# Patient Record
Sex: Female | Born: 1935 | Race: White
Health system: Southern US, Community
[De-identification: ages and names within clinical notes are randomized; demographics above are authoritative.]

## PROBLEM LIST (undated history)

## (undated) ENCOUNTER — Ambulatory Visit (INDEPENDENT_AMBULATORY_CARE_PROVIDER_SITE_OTHER): Admission: RE | Payer: Self-pay | Admitting: Family Medicine

## (undated) DIAGNOSIS — K5790 Diverticulosis of intestine, part unspecified, without perforation or abscess without bleeding: Secondary | ICD-10-CM

## (undated) DIAGNOSIS — I10 Essential (primary) hypertension: Secondary | ICD-10-CM

## (undated) DIAGNOSIS — J45909 Unspecified asthma, uncomplicated: Secondary | ICD-10-CM

## (undated) DIAGNOSIS — E079 Disorder of thyroid, unspecified: Secondary | ICD-10-CM

## (undated) DIAGNOSIS — K219 Gastro-esophageal reflux disease without esophagitis: Secondary | ICD-10-CM

## (undated) DIAGNOSIS — F552 Abuse of laxatives: Secondary | ICD-10-CM

## (undated) DIAGNOSIS — R194 Change in bowel habit: Secondary | ICD-10-CM

## (undated) DIAGNOSIS — D649 Anemia, unspecified: Secondary | ICD-10-CM

## (undated) DIAGNOSIS — R011 Cardiac murmur, unspecified: Secondary | ICD-10-CM

## (undated) DIAGNOSIS — M109 Gout, unspecified: Secondary | ICD-10-CM

## (undated) DIAGNOSIS — E785 Hyperlipidemia, unspecified: Secondary | ICD-10-CM

## (undated) DIAGNOSIS — M1990 Unspecified osteoarthritis, unspecified site: Secondary | ICD-10-CM

## (undated) DIAGNOSIS — R079 Chest pain, unspecified: Secondary | ICD-10-CM

## (undated) DIAGNOSIS — R42 Dizziness and giddiness: Secondary | ICD-10-CM

## (undated) DIAGNOSIS — T7840XA Allergy, unspecified, initial encounter: Secondary | ICD-10-CM

## (undated) DIAGNOSIS — R739 Hyperglycemia, unspecified: Secondary | ICD-10-CM

## (undated) HISTORY — DX: Gastro-esophageal reflux disease without esophagitis: K21.9

## (undated) HISTORY — PX: THYROID SURGERY: SHX805

## (undated) HISTORY — DX: Unspecified asthma, uncomplicated: J45.909

## (undated) HISTORY — PX: ABDOMINAL HYSTERECTOMY: SHX81

## (undated) HISTORY — DX: Cardiac murmur, unspecified: R01.1

## (undated) HISTORY — DX: Anemia, unspecified: D64.9

## (undated) HISTORY — DX: Dizziness and giddiness: R42

## (undated) HISTORY — DX: Change in bowel habit: R19.4

## (undated) HISTORY — DX: Gout, unspecified: M10.9

## (undated) HISTORY — DX: Essential (primary) hypertension: I10

## (undated) HISTORY — DX: Disorder of thyroid, unspecified: E07.9

## (undated) HISTORY — DX: Chest pain, unspecified: R07.9

## (undated) HISTORY — DX: Hyperglycemia, unspecified: R73.9

## (undated) HISTORY — DX: Abuse of laxatives: F55.2

## (undated) HISTORY — DX: Unspecified osteoarthritis, unspecified site: M19.90

## (undated) HISTORY — DX: Allergy, unspecified, initial encounter: T78.40XA

## (undated) HISTORY — DX: Hyperlipidemia, unspecified: E78.5

---

## 1987-10-26 HISTORY — PX: THYROIDECTOMY, PARTIAL: SHX18

## 1997-10-25 HISTORY — PX: HYSTERECTOMY: SHX81

## 1997-10-25 HISTORY — PX: BLADDER REPAIR: SHX76

## 2000-10-25 HISTORY — PX: CATARACT EXTRACTION, BILATERAL: SHX1313

## 2011-02-01 ENCOUNTER — Encounter (INDEPENDENT_AMBULATORY_CARE_PROVIDER_SITE_OTHER): Payer: Self-pay

## 2011-02-02 ENCOUNTER — Other Ambulatory Visit (INDEPENDENT_AMBULATORY_CARE_PROVIDER_SITE_OTHER): Payer: Self-pay | Admitting: Family Medicine

## 2011-02-02 ENCOUNTER — Ambulatory Visit (INDEPENDENT_AMBULATORY_CARE_PROVIDER_SITE_OTHER): Payer: Medicare Other

## 2011-02-02 DIAGNOSIS — N390 Urinary tract infection, site not specified: Secondary | ICD-10-CM

## 2011-02-02 NOTE — Progress Notes (Deleted)
Pt here for repeat urine culture.  Specimen sent to [facility].

## 2011-02-04 LAB — URINE CULTURE

## 2011-02-04 NOTE — Progress Notes (Signed)
Pt left urine sample. Sent to [facility].

## 2011-02-11 ENCOUNTER — Telehealth (INDEPENDENT_AMBULATORY_CARE_PROVIDER_SITE_OTHER): Payer: Self-pay

## 2011-02-11 NOTE — Telephone Encounter (Signed)
Pt notified of urine culture-Negative- done 02/02/2011.

## 2011-12-14 ENCOUNTER — Other Ambulatory Visit (INDEPENDENT_AMBULATORY_CARE_PROVIDER_SITE_OTHER): Payer: Self-pay | Admitting: Internal Medicine

## 2012-05-29 DIAGNOSIS — R739 Hyperglycemia, unspecified: Secondary | ICD-10-CM | POA: Insufficient documentation

## 2012-07-11 ENCOUNTER — Ambulatory Visit: Payer: Self-pay | Admitting: Family Medicine

## 2013-08-16 ENCOUNTER — Ambulatory Visit: Payer: Self-pay | Admitting: Family Medicine

## 2013-12-03 ENCOUNTER — Ambulatory Visit: Payer: Self-pay

## 2013-12-03 ENCOUNTER — Observation Stay: Payer: Self-pay | Admitting: Internal Medicine

## 2013-12-03 LAB — CK TOTAL AND CKMB (NOT AT ARMC)
CK, TOTAL: 108 U/L
CK, Total: 114 U/L
CK, Total: 105 U/L
CK-MB: 1 ng/mL (ref 0.5–3.6)
CK-MB: 0.9 ng/mL (ref 0.5–3.6)
CK-MB: 0.9 ng/mL (ref 0.5–3.6)

## 2013-12-03 LAB — CBC
HCT: 37.5 % (ref 35.0–47.0)
HGB: 12.5 g/dL (ref 12.0–16.0)
MCH: 30 pg (ref 26.0–34.0)
MCHC: 33.3 g/dL (ref 32.0–36.0)
MCV: 90 fL (ref 80–100)
Platelet: 379 10*3/uL (ref 150–440)
RBC: 4.16 10*6/uL (ref 3.80–5.20)
RDW: 13.4 % (ref 11.5–14.5)
WBC: 10.4 10*3/uL (ref 3.6–11.0)

## 2013-12-03 LAB — URINALYSIS, COMPLETE
Bacteria: NONE SEEN
Bilirubin,UR: NEGATIVE
Blood: NEGATIVE
GLUCOSE, UR: NEGATIVE mg/dL (ref 0–75)
Hyaline Cast: 4
Ketone: NEGATIVE
Leukocyte Esterase: NEGATIVE
Nitrite: NEGATIVE
Ph: 8 (ref 4.5–8.0)
Protein: NEGATIVE
RBC,UR: <1 /HPF (ref 0–5)
Specific Gravity: 1.013 (ref 1.003–1.030)
Squamous Epithelial: <1

## 2013-12-03 LAB — BASIC METABOLIC PANEL
Anion Gap: 6 — ABNORMAL LOW (ref 7–16)
BUN: 20 mg/dL — AB (ref 7–18)
CO2: 27 mmol/L (ref 21–32)
Calcium, Total: 9.7 mg/dL (ref 8.5–10.1)
Chloride: 101 mmol/L (ref 98–107)
Creatinine: 0.89 mg/dL (ref 0.60–1.30)
EGFR (African American): >60
Glucose: 99 mg/dL (ref 65–99)
Osmolality: 271 (ref 275–301)
Potassium: 4.7 mmol/L (ref 3.5–5.1)
Sodium: 134 mmol/L — ABNORMAL LOW (ref 136–145)

## 2013-12-03 LAB — TROPONIN I
Troponin-I: <0.02 ng/mL
Troponin-I: <0.02 ng/mL

## 2013-12-04 LAB — CBC WITH DIFFERENTIAL/PLATELET
BASOS ABS: 0.1 10*3/uL (ref 0.0–0.1)
Basophil %: 1 %
EOS PCT: 4.1 %
Eosinophil #: 0.4 10*3/uL (ref 0.0–0.7)
HCT: 36.3 % (ref 35.0–47.0)
HGB: 12.4 g/dL (ref 12.0–16.0)
LYMPHS ABS: 3.4 10*3/uL (ref 1.0–3.6)
Lymphocyte %: 30.9 %
MCH: 30.6 pg (ref 26.0–34.0)
MCHC: 34.1 g/dL (ref 32.0–36.0)
MCV: 90 fL (ref 80–100)
MONO ABS: 0.9 x10 3/mm (ref 0.2–0.9)
MONOS PCT: 8.6 %
Neutrophil #: 6 10*3/uL (ref 1.4–6.5)
Neutrophil %: 55.4 %
Platelet: 360 10*3/uL (ref 150–440)
RBC: 4.03 10*6/uL (ref 3.80–5.20)
RDW: 13.7 % (ref 11.5–14.5)
WBC: 10.9 10*3/uL (ref 3.6–11.0)

## 2013-12-04 LAB — LIPID PANEL
Cholesterol: 175 mg/dL (ref 0–200)
HDL: 29 mg/dL — AB (ref 40–60)
Ldl Cholesterol, Calc: 92 mg/dL (ref 0–100)
TRIGLYCERIDES: 272 mg/dL — AB (ref 0–200)
VLDL Cholesterol, Calc: 54 mg/dL — ABNORMAL HIGH (ref 5–40)

## 2013-12-04 LAB — BASIC METABOLIC PANEL
Anion Gap: 3 — ABNORMAL LOW (ref 7–16)
BUN: 24 mg/dL — ABNORMAL HIGH (ref 7–18)
CALCIUM: 9.8 mg/dL (ref 8.5–10.1)
CHLORIDE: 101 mmol/L (ref 98–107)
CREATININE: 1.37 mg/dL — AB (ref 0.60–1.30)
Co2: 31 mmol/L (ref 21–32)
EGFR (African American): 43 — ABNORMAL LOW
EGFR (Non-African Amer.): 37 — ABNORMAL LOW
GLUCOSE: 101 mg/dL — AB (ref 65–99)
Osmolality: 274 (ref 275–301)
Potassium: 4 mmol/L (ref 3.5–5.1)
SODIUM: 135 mmol/L — AB (ref 136–145)

## 2014-01-16 DIAGNOSIS — M109 Gout, unspecified: Secondary | ICD-10-CM | POA: Insufficient documentation

## 2014-08-17 ENCOUNTER — Ambulatory Visit: Payer: Self-pay | Admitting: Physician Assistant

## 2014-09-02 DIAGNOSIS — R194 Change in bowel habit: Secondary | ICD-10-CM | POA: Insufficient documentation

## 2014-09-30 ENCOUNTER — Ambulatory Visit: Payer: Self-pay | Admitting: Gastroenterology

## 2014-10-10 ENCOUNTER — Ambulatory Visit: Payer: Self-pay | Admitting: Gastroenterology

## 2014-12-24 DIAGNOSIS — M1A09X0 Idiopathic chronic gout, multiple sites, without tophus (tophi): Secondary | ICD-10-CM | POA: Insufficient documentation

## 2014-12-24 DIAGNOSIS — I10 Essential (primary) hypertension: Secondary | ICD-10-CM | POA: Insufficient documentation

## 2015-02-15 NOTE — Discharge Summary (Signed)
PATIENT NAME:  Michele Hubbard, Zephyr S MR#:  [id number] DATE OF BIRTH:  September 07, 1935  DATE OF ADMISSION:  12/03/2013 DATE OF DISCHARGE:  12/04/2013  ADMISSION DIAGNOSIS:  Chest pain.   DISCHARGE DIAGNOSES: 1.  Chest pain not related to cardiac issues.  2.  History of hypertension. 3.  History of hypothyroidism.  4.  Arthritis.   CONSULTATIONS: Cardiology.   The patient underwent a nuclear medicine stress test which is negative for any evidence of ischemia.  Discharge white blood cells 10.9, hemoglobin 12.4, hematocrit 36.3, platelets are 360. Sodium 135, potassium 4.0, chloride 101, bicarb 31, BUN 24, creatinine 1.37, glucose is 101. LDL is 92, cholesterol 175.  HOSPITAL COURSE: A 80 year old female who presented with chest pain,  and hypertension. For further details, please refer to the H and P.   1.  Chest pain.  This did not seem cardiac in nature. The patient was admitted to telemetry. Her cardiac enzymes x 3 were negative. She did undergo a Myoview stress test, which was negative. She had no evidence of chest pain during the hospitalization. Cardiology was consulted. This is not felt to be cardiac in nature.  2.  Malignant hypertension. Blood pressure was high in the Emergency Room; this is treated.  In the inpatient setting at the hospital, her blood pressures were tolerable. She will be discharged back with her outpatient medications.  3.  Hyperlipidemia. The patient will continue on outpatient medications.  4.  Hypothyroidism.  The patient will continue on Synthroid.   DISCHARGE MEDICATIONS: 1.  Colchicine 0.6 mg as needed.  2.  Vitamin D3 one tablet daily.  3.  HCTZ/losartan 25/100 daily.  4.  Meclizine 25 mg t.i.d. p.r.n.  5.  Montelukast 10 mg at bedtime.  6.  Multivitamin 1 tablet daily.  7.  Fish oil 1000 mg b.i.d. 8.  Ranitidine 150 mg b.i.d.  9.  Simvastatin 20 mg at bedtime.  10.  Triamcinolone 2 sprays daily as needed.  11.  Synthroid 75 mcg daily.  12.  ProAir 2 puffs  q. 6 hours p.r.n.  13.  Aspirin 81 mg daily.  14.  Atenolol 50 mg daily.  15.  Calcium plus vitamin D 1 tablet b.i.d.  16.  Clobetasol apply to affected area b.i.d.   DISCHARGE DIET: Low sodium.   DISCHARGE ACTIVITY: As tolerated.  DISCHARGE FOLLOWUP:  The patient will follow up in 2 weeks with Dr. Lady GaryFath, in 1 week with Dr. Dayna BarkerAldridge. Her creatinine was slightly elevated so since she is on losartan/HCTZ, we recommend Dr. Dayna BarkerAldridge to repeat a BMP next week when she sees the patient.  TIME SPENT:  35 minutes.  The patient is medically stable for discharge.   ____________________________ Tali Coster P. Juliene PinaMody, MD spm:ce D: 12/04/2013 14:50:42 ET T: 12/04/2013 17:50:45 ET JOB#: [id number]  cc: Dorwin Fitzhenry P. Juliene PinaMody, MD, <Dictator> Janyth ContesSITAL P Mindie Rawdon MD ELECTRONICALLY SIGNED 12/05/2013 14:35

## 2015-02-15 NOTE — H&P (Signed)
PATIENT NAME:  Michele Hubbard, Michele Hubbard MR#:  [id number] DATE OF BIRTH:  [birth year]  DATE OF ADMISSION:  12/03/2013  PRIMARY CARE PHYSICIAN: Leim FabryBarbara Aldridge, MD  HISTORY OF PRESENT ILLNESS: The patient is a 80 year old Caucasian female with past medical history significant for history of hypertension, hyperlipidemia, gastroesophageal reflux disease, also hiatal hernia who presents to the hospital with complaints of chest pain. According to the patient, she was doing well up until approximately yesterday at 2:00 p.m. when she started having significant chest pain while she was standing [facility] [employer] ordering a meal. Pain was described as gripping, muscle spasm pain, 8 out of 10 by intensity, intermittent pain with some improvements. It would last approximately 5 minutes and then it would go away for 10 or 15 minutes and it would come back. Pain was radiating into the right upper chest posteriorly. There was no change with deep breathing or walking or change with meals. The patient felt that it could have been meal related or maybe acting of her hiatal hernia so she decided to take numerous Tums after which her pain somewhat abated but not significantly so. Today she still has some intermittent pains in the anterior chest, and she decided to go to urgent care for further evaluation. In urgent care, her EKG was found to be slightly abnormal with ST depressions in anterolateral leads as well as inferior leads and she was sent to the Emergency Room for further evaluation. In the Emergency Room, her labs were unremarkable, however, hospitalist services were contacted for admission since the patient'Hubbard pain is still there. Her blood pressure is also markedly elevated close to 190s over 90s here in the Emergency Room.   PAST MEDICAL HISTORY: Significant for hypertension, gastroesophageal disease, hyperlipidemia, hypothyroidism, hiatal hernia, gout, arthritis, allergies, wheezing in the lungs, and lichen sclerosis.   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 Also bladder repair, skin mole removal, hysterectomy, colonoscopy, and polyps in the colon.   ALLERGIES: SULFA AS WELL AS DARVOCET.  FAMILY HISTORY: Hypertension in the patient'Hubbard father who had a massive heart attack at age of 78. The patient'Hubbard mother had COPD as well as osteoporosis and thyroid problems.   SOCIAL HISTORY: The patient is widowed since [id number]. She lives with her sister. She has 2 children. One son lives in [place], [place] and the other one [place]. No smoking, current or prior, or alcohol abuse.   REVIEW OF SYSTEMS: Positive for feeling cold, pains in the chest, bilateral cataract removal, bifocal glasses, some sinus congestion, intermittent headaches, postnasal drip, some wheezing as well as dyspnea on exertion, intermittent chest pains since yesterday, also diarrheal stool earlier today in the morning.  CONSTITUTIONAL: Otherwise, denies any high fevers, fatigue or weakness, weight loss or gain. EYES: Denies any blurry vision, double vision, or glaucoma.  ENT: Denies any tinnitus, allergies, epistaxis, sinus pain, dentures, or difficulty swallowing.  RESPIRATORY: Denies any cough, hemoptysis, asthma, or COPD. CARDIOVASCULAR: Denies any orthopnea, edema, arrhythmias, palpitations, or syncope.  GASTROINTESTINAL: Denies nausea, vomiting, rectal bleeding, or change in bowel habits.  GENITOURINARY: Denies dysuria, hematuria, frequency, or incontinence.  ENDOCRINOLOGY: Denies any polydipsia, nocturia, thyroid problems, heat or cold intolerance or thirst.  HEMATOLOGIC: Denies anemia, easy bruising, bleeding swollen glands.  SKIN: Denies any acne, rashes, lesions, or change in moles.  MUSCULOSKELETAL: Denies arthritis, cramps, swelling. NEUROLOGIC: Denies epilepsy or tremors.  PSYCHIATRIC: Denies any anxiety, insomnia, or depression.  PHYSICAL EXAMINATION: VITAL SIGNS: On arrival to the  hospital,  the patient'Hubbard temperature was 98.1, pulse was 102, respiratory rate was 18, blood pressure 187/89, and saturation 94% on room air.  GENERAL: This is a well-developed, well-nourished Caucasian female in no significant distress, sitting on the stretcher. She is pale.  HEENT: Her pupils are equal and reactive to light. Extraocular movements are intact. No icterus or conjunctivitis. Has normal hearing. No pharyngeal erythema. Mucosa is dry.  NECK: No masses. Supple and nontender. Thyroid is not enlarged. No adenopathy. No JVD or carotids bilaterally. Full range of motion.  LUNGS: Clear to auscultation in all fields. No rales, rhonchi, diminished breath sounds, or wheezing. No labored inspiration, decreased effort, dullness to percussion, or overt respiratory distress.  CARDIOVASCULAR: S1 and S2 appreciated. No murmurs, gallops, or rubs noted. PMI not lateralized. Chest is nontender to palpation.  EXTREMITIES: 1+ pedal pulses.  EXTREMITIES: No lower extremity edema, calf tenderness or cyanosis. Mild discomfort on palpation over her lower sternum however is noted, but not the same pain that the patient came in with.  ABDOMEN: Soft and nontender. Bowel sounds are present. No hepatosplenomegaly or masses were noted.  RECTAL: Deferred.  MUSCULOSKELETAL Muscle strength: Able to move all extremities. No cyanosis or degenerative joint disease. Mild kyphosis. Gait not tested. The patient'Hubbard spine is nontender to palpation.  SKIN: Did not reveal any rashes, lesions, erythema, nodularity, or induration. It was warm and dry to palpation.  LYMPHATIC: No adenopathy in the cervical region.  NEUROLOGIC: Cranial nerves grossly intact. Sensory is intact. No dysarthria or aphasia. The patient is alert and oriented to time, person and place, cooperative. Memory is good.  PSYCHIATRIC: No significant confusion, agitation, or depression noted.   LABORATORY AND DIAGNOSTICS: The patient'Hubbard lab data done today, on  12/03/2013, revealed mild elevation of BUN to 20 and sodium 134, otherwise BMP was unremarkable. Troponin level less than 0.02. CBC within normal limits.   The patient'Hubbard EKG initially in urgent care revealed normal sinus rhythm at 92 beats per minute, normal axis, minimal voltage criteria for LVH. Also anterior infarct with poor RV progression in V3. ST depressions in anterolateral leads, also anterior leads. A repeated EKG here in the Emergency Room showed normal sinus rhythm at 98 beats per minute, normal axis, ST-T depressions. Consider inferior lateral ischemia, according to EKG criteria.   Chest x-ray, portable single view, 12/03/2013, showed no active disease.   ASSESSMENT AND PLAN: 1.  Chest pain. Admit her to the medical floor. Start her on metoprolol, nitroglycerin, and aspirin as well as heparin subcutaneously. Will get Myoview stress test in the morning. We will get also cardiology consultation. 2.  Malignant hypertension, possibly the reason for the patient'Hubbard chest pains as well as headaches and dizziness. Will again initiate metoprolol as well as nitroglycerin while she is in the hospital. Will continue all other home medications. The patient'Hubbard blood pressure is very high and needs to be treated more aggressively. 3.  Hyperlipidemia. We will continue home medications. Get lipid panel in the morning. 4.  Gastroesophageal reflux disease. We will continue proton pump inhibitors.   TIME SPENT: 50 minutes. ____________________________ Katharina Caper, MD rv:sb D: 12/03/2013 13:57:09 ET T: 12/03/2013 14:24:35 ET JOB#: [id number]  cc: Katharina Caper, MD, <Dictator> Katina Dung. Dayna Barker, MD Katharina Caper MD ELECTRONICALLY SIGNED 12/26/2013 20:29

## 2015-02-15 NOTE — H&P (Signed)
PATIENT NAME:  Reginold AgentCHILDRESS, Michele S MR#:  [id number] DATE OF BIRTH:  1935-05-28  DATE OF ADMISSION:  12/03/2013  ADDENDUM MEDICATION LIST IS AS FOLLOWS:  1. Aspirin 81 mg p.o. daily.  2. Atenolol 50 mg p.o. daily.  3. Calcium with vitamin D 600/200, 1 tablet twice daily.  4. Clobestasol topical lotion 0.05% apply to affected area twice daily as needed for lichen disease.  5. Colchicine 0.6 mg 2 tablets once at the first sign of a gout flare, and then  1 tablet every one hour, then 1 tablet p.o. daily for the next three days.  6. Fish oil 1 gram twice daily.  7. Hydrochlorothiazide losartan 25/100, 1 tablet once daily.  8. Levothyroxine 75 mcg p.o. daily.  9. Meclizine 25 mg p.o. 3 times daily as needed.  10. Montelukast 10 mg p.o. daily at bedtime.  11. Multivitamins once daily.  12. ProAir HFA 2 puffs every six hours as needed.  13. Ranitidine 150 mg twice daily.  14. Simvastatin 10 mg p.o. at bedtime.  15. Triamcinolone nasal spray, 2 sprays to the nose once daily.  16. Vitamin D3 1 tablet once daily.     ____________________________ Katharina Caperima Jafar Poffenberger, MD rv:sg D: 12/03/2013 14:00:00 ET T: 12/03/2013 14:20:13 ET JOB#: [id number]  cc: Katharina Caperima Kynesha Guerin, MD, <Dictator> Keyli Duross MD ELECTRONICALLY SIGNED 12/26/2013 20:32

## 2015-02-15 NOTE — Consult Note (Signed)
   Present Illness 80 yo female with history of copd, hypertension, hyperlipidemia and atypical chest pain who has undergone a functional study in the past which revealed no ischemia. She is now admitted agter several days of episodic "cramping" chest pain. Not worsened with exertion or food intake. She presented to the er where her ekg was unremarkable. She has ruled out for an mi and has had no further chest pain since admission. She is treated with atenolol, losartan, asa and simvastatin as outpatient.   Physical Exam:  GEN no acute distress, obese   HEENT PERRL, hearing intact to voice   NECK supple  No masses   RESP normal resp effort  no use of accessory muscles   CARD Regular rate and rhythm  No murmur   ABD denies tenderness  normal BS   LYMPH negative neck, negative axillae   EXTR negative cyanosis/clubbing, negative edema   SKIN normal to palpation, skin turgor decreased   NEURO cranial nerves intact, motor/sensory function intact   PSYCH A+O to time, place, person   Review of Systems:  Subjective/Chief Complaint chest pain   General: No Complaints   Skin: No Complaints   ENT: No Complaints   Eyes: No Complaints   Neck: No Complaints   Respiratory: No Complaints   Cardiovascular: Chest pain or discomfort   Gastrointestinal: No Complaints   Genitourinary: No Complaints   Vascular: No Complaints   Musculoskeletal: No Complaints   Neurologic: No Complaints   Hematologic: No Complaints   Endocrine: No Complaints   Psychiatric: No Complaints   Review of Systems: All other systems were reviewed and found to be negative   Medications/Allergies Reviewed Medications/Allergies reviewed   EKG:  EKG NSR   Abnormal NSSTTW changes   Interpretation no ischemia    Sulfa drugs: Hives   Impression 80 yo female with history of hypertension and hyperlipidemia who was admitted with episodic chest discomfort. She has ruled out for an mi and ekg and cxr  are both unremarkable. Pain has both typical and atypical feauters.   Plan 1. Continue with atenolol , simvastatin, losartan 2. Proceed with funcitonal study in am 3. Will make further recs after funcitonal study is copleted.   Electronic Signatures: Dalia HeadingFath, Lakyia Behe A (MD)  (Signed 09-Feb-15 20:37)  Authored: General Aspect/Present Illness, History and Physical Exam, Review of System, EKG , Allergies, Impression/Plan   Last Updated: 09-Feb-15 20:37 by Dalia HeadingFath, Venera Privott A (MD)

## 2015-04-21 ENCOUNTER — Ambulatory Visit
Admission: EM | Admit: 2015-04-21 | Discharge: 2015-04-21 | Disposition: A | Discharge status: Home / Self Care | Payer: Medicare Other | Attending: Internal Medicine | Admitting: Internal Medicine

## 2015-04-21 ENCOUNTER — Encounter: Payer: Self-pay | Admitting: Emergency Medicine

## 2015-04-21 DIAGNOSIS — I10 Essential (primary) hypertension: Secondary | ICD-10-CM | POA: Diagnosis not present

## 2015-04-21 HISTORY — DX: Essential (primary) hypertension: I10

## 2015-04-21 NOTE — Discharge Instructions (Signed)

## 2015-04-21 NOTE — ED Notes (Signed)
Patient states her BP has been elevated off and on for several days  And she has a headache and neck ache

## 2015-04-21 NOTE — ED Provider Notes (Signed)
CSN: [id number]     Arrival date & time 04/21/15  1104 History   First MD Initiated Contact with Patient 04/21/15 1202     Chief Complaint  Patient presents with  . Hypertension   (Consider location/radiation/quality/duration/timing/severity/associated sxs/prior Treatment) HPI  This a 80 year old female who presents with elevated blood pressure and left-sided headache which is not unusual for her. She states that she tried to contact Dr. Leim Fabry today was told that they were booked and she spoke with the nurse who directed her here. Her pressures at home have been 179/84 and her last note from Dr. Dayna Barker of 12/24/2014 is 132/64. She also relates that she has missed 2 days of her Synthroid and noticed that she was feeling weak and fatigued. She started taking her Synthroid again yesterday and today. Recheck of her blood pressure manually by the nurse was recorded at 160/82. She denies any syncope dizziness weakness slurred speech or decreased concentration. She is under some stress taking care of her sister who had a stroke and requires assistance with medications transportation and some activities of daily living.  Past Medical History  Diagnosis Date  . Hypertension    Past Surgical History  Procedure Laterality Date  . Abdominal hysterectomy    . Thyroid surgery     Family History  Problem Relation Age of Onset  . Heart failure Father    History  Substance Use Topics  . Smoking status: Never Smoker   . Smokeless tobacco: Never Used  . Alcohol Use: No   OB History    No data available     Review of Systems  Neurological: Positive for headaches.  All other systems reviewed and are negative.   Allergies  Penicillins  Home Medications   Prior to Admission medications   Medication Sig Start Date End Date Taking? Authorizing Provider  albuterol (PROVENTIL HFA;VENTOLIN HFA) 108 (90 BASE) MCG/ACT inhaler Inhale into the lungs every 6 (six) hours as needed for  wheezing or shortness of breath.   Yes Historical Provider, MD  allopurinol (ZYLOPRIM) 100 MG tablet Take 200 mg by mouth daily.   Yes Historical Provider, MD  aspirin EC 81 MG tablet Take 81 mg by mouth daily.   Yes Historical Provider, MD  atenolol (TENORMIN) 100 MG tablet Take 100 mg by mouth daily.   Yes Historical Provider, MD  atorvastatin (LIPITOR) 20 MG tablet Take 20 mg by mouth daily.   Yes Historical Provider, MD  clobetasol cream (TEMOVATE) 0.05 % Apply 1 application topically 2 (two) times daily.   Yes Historical Provider, MD  colchicine 0.6 MG tablet Take 0.6 mg by mouth daily.   Yes Historical Provider, MD  esomeprazole (NEXIUM) 20 MG capsule Take 20 mg by mouth daily at 12 noon.   Yes Historical Provider, MD  levothyroxine (SYNTHROID, LEVOTHROID) 75 MCG tablet Take 75 mcg by mouth daily before breakfast.   Yes Historical Provider, MD  losartan-hydrochlorothiazide (HYZAAR) 100-25 MG per tablet Take 1 tablet by mouth daily.   Yes Historical Provider, MD  meclizine (ANTIVERT) 25 MG tablet Take 25 mg by mouth 3 (three) times daily as needed for dizziness.   Yes Historical Provider, MD   BP 168/82 mmHg  Pulse 57  Temp(Src) 98.1 F (36.7 C) (Tympanic)  Resp 16  Ht 5\' 2"  (1.575 m)  Wt 134 lb (60.782 kg)  BMI 24.50 kg/m2  SpO2 100% Physical Exam  Constitutional: She is oriented to person, place, and time. She appears well-developed and well-nourished.  HENT:  Head: Normocephalic and atraumatic.  Right Ear: External ear normal.  Left Ear: External ear normal.  Mouth/Throat: Oropharynx is clear and moist.  Eyes: EOM are normal. Pupils are equal, round, and reactive to light. Right eye exhibits no discharge. Left eye exhibits no discharge.  Neck: Normal range of motion. Neck supple. No thyromegaly present.  Pulmonary/Chest: Breath sounds normal. No respiratory distress. She has no wheezes. She exhibits no tenderness.  Musculoskeletal: Normal range of motion.  Lymphadenopathy:     She has no cervical adenopathy.  Neurological: She is alert and oriented to person, place, and time. She has normal reflexes. She displays normal reflexes. No cranial nerve deficit. She exhibits normal muscle tone. Coordination normal.  Skin: Skin is warm and dry.  Psychiatric: She has a normal mood and affect. Her behavior is normal. Judgment and thought content normal.  Nursing note and vitals reviewed.   ED Course  Procedures (including critical care time) Labs Review Labs Reviewed - No data to display  Imaging Review No results found.   MDM   1. Hypertension not at goal    I spoke with the patient today after examination found no focal defects from her elevated pressure. She felt badly after missing her doses of Synthroid and incidentally took her blood pressure finding it to be slightly elevated. At this point time a recheck with manual pressure should be 168/82. I have reassured her that she is not in a crisis or urgency at the present time. However asked her to continue checking her blood pressure and she's feeling poorly and her pressures are elevating should go to the emergency department. I've encouraged her to establish an appointment with Dr. Theodis Aguas for reevaluation and possible tweaking of her medications as necessary. I would not make any changes to her medication regimen at this time.    Lutricia Feil, PA-C 04/21/15 1243

## 2015-07-07 ENCOUNTER — Other Ambulatory Visit: Payer: Self-pay | Admitting: Family Medicine

## 2015-07-07 DIAGNOSIS — Z780 Asymptomatic menopausal state: Secondary | ICD-10-CM

## 2016-01-07 DIAGNOSIS — D649 Anemia, unspecified: Secondary | ICD-10-CM | POA: Insufficient documentation

## 2016-07-15 ENCOUNTER — Encounter: Payer: Self-pay | Admitting: Emergency Medicine

## 2016-07-15 ENCOUNTER — Emergency Department: Payer: Medicare Other

## 2016-07-15 ENCOUNTER — Observation Stay
Admission: EM | Admit: 2016-07-15 | Discharge: 2016-07-16 | Disposition: A | Discharge status: Home / Self Care | Payer: Medicare Other | Attending: Internal Medicine | Admitting: Internal Medicine

## 2016-07-15 DIAGNOSIS — Z7982 Long term (current) use of aspirin: Secondary | ICD-10-CM | POA: Insufficient documentation

## 2016-07-15 DIAGNOSIS — R079 Chest pain, unspecified: Principal | ICD-10-CM | POA: Diagnosis present

## 2016-07-15 DIAGNOSIS — E785 Hyperlipidemia, unspecified: Secondary | ICD-10-CM | POA: Insufficient documentation

## 2016-07-15 DIAGNOSIS — Z880 Allergy status to penicillin: Secondary | ICD-10-CM | POA: Insufficient documentation

## 2016-07-15 DIAGNOSIS — Z7952 Long term (current) use of systemic steroids: Secondary | ICD-10-CM | POA: Diagnosis not present

## 2016-07-15 DIAGNOSIS — I10 Essential (primary) hypertension: Secondary | ICD-10-CM | POA: Insufficient documentation

## 2016-07-15 DIAGNOSIS — Z79899 Other long term (current) drug therapy: Secondary | ICD-10-CM | POA: Diagnosis not present

## 2016-07-15 DIAGNOSIS — Z90710 Acquired absence of both cervix and uterus: Secondary | ICD-10-CM | POA: Diagnosis not present

## 2016-07-15 DIAGNOSIS — E890 Postprocedural hypothyroidism: Secondary | ICD-10-CM | POA: Diagnosis not present

## 2016-07-15 HISTORY — DX: Diverticulosis of intestine, part unspecified, without perforation or abscess without bleeding: K57.90

## 2016-07-15 HISTORY — DX: Disorder of thyroid, unspecified: E07.9

## 2016-07-15 LAB — CBC
HCT: 35.1 % (ref 35.0–47.0)
HCT: 31.7 % — ABNORMAL LOW (ref 35.0–47.0)
HEMOGLOBIN: 12 g/dL (ref 12.0–16.0)
HEMOGLOBIN: 11.3 g/dL — AB (ref 12.0–16.0)
MCH: 30.3 pg (ref 26.0–34.0)
MCH: 31.3 pg (ref 26.0–34.0)
MCHC: 34.1 g/dL (ref 32.0–36.0)
MCHC: 35.6 g/dL (ref 32.0–36.0)
MCV: 88.8 fL (ref 80.0–100.0)
MCV: 88.1 fL (ref 80.0–100.0)
PLATELETS: 339 10*3/uL (ref 150–440)
Platelets: 311 10*3/uL (ref 150–440)
RBC: 3.95 MIL/uL (ref 3.80–5.20)
RBC: 3.59 MIL/uL — AB (ref 3.80–5.20)
RDW: 13.9 % (ref 11.5–14.5)
RDW: 13.6 % (ref 11.5–14.5)
WBC: 12.7 10*3/uL — AB (ref 3.6–11.0)
WBC: 8.5 10*3/uL (ref 3.6–11.0)

## 2016-07-15 LAB — COMPREHENSIVE METABOLIC PANEL
ALK PHOS: 58 U/L (ref 38–126)
ALT: 15 U/L (ref 14–54)
AST: 23 U/L (ref 15–41)
Albumin: 4.7 g/dL (ref 3.5–5.0)
Anion gap: 9 (ref 5–15)
BUN: 22 mg/dL — AB (ref 6–20)
CO2: 25 mmol/L (ref 22–32)
CREATININE: 1.03 mg/dL — AB (ref 0.44–1.00)
Calcium: 9.9 mg/dL (ref 8.9–10.3)
Chloride: 99 mmol/L — ABNORMAL LOW (ref 101–111)
GFR calc Af Amer: 58 mL/min — ABNORMAL LOW (ref >60)
GFR calc non Af Amer: 50 mL/min — ABNORMAL LOW (ref >60)
Glucose, Bld: 106 mg/dL — ABNORMAL HIGH (ref 65–99)
Potassium: 3.8 mmol/L (ref 3.5–5.1)
SODIUM: 133 mmol/L — AB (ref 135–145)
Total Bilirubin: 0.8 mg/dL (ref 0.3–1.2)
Total Protein: 8.2 g/dL — ABNORMAL HIGH (ref 6.5–8.1)

## 2016-07-15 LAB — CREATININE, SERUM
CREATININE: 0.94 mg/dL (ref 0.44–1.00)
GFR calc non Af Amer: 56 mL/min — ABNORMAL LOW (ref >60)

## 2016-07-15 LAB — SEDIMENTATION RATE: Sed Rate: 25 mm/hr (ref 0–30)

## 2016-07-15 LAB — BRAIN NATRIURETIC PEPTIDE: B Natriuretic Peptide: 239 pg/mL — ABNORMAL HIGH (ref 0.0–100.0)

## 2016-07-15 LAB — TROPONIN I
Troponin I: <0.03 ng/mL (ref <0.03)
Troponin I: <0.03 ng/mL (ref <0.03)

## 2016-07-15 MED ORDER — MECLIZINE HCL 25 MG PO TABS
12.5000 mg | ORAL_TABLET | Freq: Once | ORAL | Status: AC
  Administered 2016-07-15: 12.5 mg via ORAL
  Filled 2016-07-15: qty 0.5

## 2016-07-15 MED ORDER — HYDRALAZINE HCL 20 MG/ML IJ SOLN
10.0000 mg | Freq: Four times a day (QID) | INTRAMUSCULAR | Status: DC | PRN
Start: 2016-07-15 — End: 2016-07-16

## 2016-07-15 MED ORDER — GI COCKTAIL ~~LOC~~
30.0000 mL | Freq: Once | ORAL | Status: AC
  Administered 2016-07-15: 30 mL via ORAL
  Filled 2016-07-15: qty 30

## 2016-07-15 MED ORDER — ATENOLOL 25 MG PO TABS
50.0000 mg | ORAL_TABLET | Freq: Every day | ORAL | Status: DC
  Administered 2016-07-16: 50 mg via ORAL
  Filled 2016-07-15: qty 2

## 2016-07-15 MED ORDER — ATORVASTATIN CALCIUM 20 MG PO TABS
20.0000 mg | ORAL_TABLET | Freq: Every day | ORAL | Status: DC
  Administered 2016-07-16: 20 mg via ORAL
  Filled 2016-07-15: qty 1

## 2016-07-15 MED ORDER — HYDROCODONE-ACETAMINOPHEN 5-325 MG PO TABS: 1.0000 | ORAL_TABLET | ORAL | Status: DC | PRN

## 2016-07-15 MED ORDER — HYDROCHLOROTHIAZIDE 25 MG PO TABS
25.0000 mg | ORAL_TABLET | Freq: Every day | ORAL | Status: DC
  Administered 2016-07-16: 25 mg via ORAL
  Filled 2016-07-15: qty 1

## 2016-07-15 MED ORDER — ASPIRIN 81 MG PO CHEW
81.0000 mg | CHEWABLE_TABLET | Freq: Every day | ORAL | Status: DC
  Administered 2016-07-16: 81 mg via ORAL
  Filled 2016-07-15: qty 1

## 2016-07-15 MED ORDER — ALUM & MAG HYDROXIDE-SIMETH 200-200-20 MG/5ML PO SUSP
15.0000 mL | Freq: Four times a day (QID) | ORAL | Status: DC | PRN
Start: 2016-07-15 — End: 2016-07-16
  Administered 2016-07-15: 15 mL via ORAL
  Filled 2016-07-15: qty 30

## 2016-07-15 MED ORDER — ALLOPURINOL 100 MG PO TABS
100.0000 mg | ORAL_TABLET | Freq: Every day | ORAL | Status: DC
  Administered 2016-07-16: 100 mg via ORAL
  Filled 2016-07-15: qty 1

## 2016-07-15 MED ORDER — ACETAMINOPHEN 650 MG RE SUPP: 650.0000 mg | Freq: Four times a day (QID) | RECTAL | Status: DC | PRN

## 2016-07-15 MED ORDER — AMLODIPINE BESYLATE 5 MG PO TABS
5.0000 mg | ORAL_TABLET | Freq: Every day | ORAL | Status: DC
  Administered 2016-07-15 – 2016-07-16 (×2): 5 mg via ORAL
  Filled 2016-07-15 (×2): qty 1

## 2016-07-15 MED ORDER — KRILL OIL 1000 MG PO CAPS: ORAL_CAPSULE | Freq: Every day | ORAL | Status: DC

## 2016-07-15 MED ORDER — LOSARTAN POTASSIUM 50 MG PO TABS
100.0000 mg | ORAL_TABLET | Freq: Every day | ORAL | Status: DC
  Administered 2016-07-16: 100 mg via ORAL
  Filled 2016-07-15: qty 2

## 2016-07-15 MED ORDER — VITAMIN D 1000 UNITS PO TABS
1000.0000 [IU] | ORAL_TABLET | Freq: Two times a day (BID) | ORAL | Status: DC
  Administered 2016-07-16: 1000 [IU] via ORAL
  Filled 2016-07-15: qty 1

## 2016-07-15 MED ORDER — ACETAMINOPHEN 325 MG PO TABS
650.0000 mg | ORAL_TABLET | Freq: Four times a day (QID) | ORAL | Status: DC | PRN
  Administered 2016-07-16: 650 mg via ORAL
  Filled 2016-07-15: qty 2

## 2016-07-15 MED ORDER — LEVOTHYROXINE SODIUM 50 MCG PO TABS
75.0000 ug | ORAL_TABLET | Freq: Every day | ORAL | Status: DC
  Administered 2016-07-16: 75 ug via ORAL
  Filled 2016-07-15: qty 2

## 2016-07-15 MED ORDER — LOSARTAN POTASSIUM-HCTZ 100-25 MG PO TABS: 1.0000 | ORAL_TABLET | Freq: Every day | ORAL | Status: DC

## 2016-07-15 MED ORDER — SODIUM CHLORIDE 0.9 % IV SOLN
INTRAVENOUS | Status: DC
  Administered 2016-07-16: via INTRAVENOUS

## 2016-07-15 MED ORDER — ADULT MULTIVITAMIN W/MINERALS CH
1.0000 | ORAL_TABLET | Freq: Two times a day (BID) | ORAL | Status: DC
  Administered 2016-07-15 – 2016-07-16 (×2): 1 via ORAL
  Filled 2016-07-15 (×2): qty 1

## 2016-07-15 MED ORDER — ENOXAPARIN SODIUM 40 MG/0.4ML ~~LOC~~ SOLN
40.0000 mg | SUBCUTANEOUS | Status: DC
  Administered 2016-07-15: 40 mg via SUBCUTANEOUS
  Filled 2016-07-15: qty 0.4

## 2016-07-15 MED ORDER — ONDANSETRON HCL 4 MG PO TABS: 4.0000 mg | ORAL_TABLET | Freq: Four times a day (QID) | ORAL | Status: DC | PRN

## 2016-07-15 MED ORDER — OMEGA-3-ACID ETHYL ESTERS 1 G PO CAPS
1.0000 g | ORAL_CAPSULE | Freq: Two times a day (BID) | ORAL | Status: DC
  Administered 2016-07-16: 1 g via ORAL
  Filled 2016-07-15: qty 1

## 2016-07-15 MED ORDER — COLCHICINE 0.6 MG PO TABS
0.6000 mg | ORAL_TABLET | Freq: Every day | ORAL | Status: DC
  Administered 2016-07-16: 0.6 mg via ORAL
  Filled 2016-07-15: qty 1

## 2016-07-15 MED ORDER — TRIAMCINOLONE ACETONIDE 55 MCG/ACT NA AERO: 2.0000 | INHALATION_SPRAY | Freq: Every day | NASAL | Status: DC

## 2016-07-15 MED ORDER — HYDROCHLOROTHIAZIDE 25 MG PO TABS: 25.0000 mg | ORAL_TABLET | Freq: Every day | ORAL | Status: DC

## 2016-07-15 MED ORDER — ONDANSETRON HCL 4 MG/2ML IJ SOLN: 4.0000 mg | Freq: Four times a day (QID) | INTRAMUSCULAR | Status: DC | PRN

## 2016-07-15 MED ORDER — LOSARTAN POTASSIUM 50 MG PO TABS: 100.0000 mg | ORAL_TABLET | Freq: Every day | ORAL | Status: DC

## 2016-07-15 MED ORDER — FLUTICASONE PROPIONATE 50 MCG/ACT NA SUSP
2.0000 | Freq: Every day | NASAL | Status: DC
  Administered 2016-07-15 – 2016-07-16 (×2): 2 via NASAL
  Filled 2016-07-15: qty 16

## 2016-07-15 MED ORDER — SENNOSIDES-DOCUSATE SODIUM 8.6-50 MG PO TABS: 1.0000 | ORAL_TABLET | Freq: Every evening | ORAL | Status: DC | PRN

## 2016-07-15 MED ORDER — PANTOPRAZOLE SODIUM 40 MG PO TBEC
40.0000 mg | DELAYED_RELEASE_TABLET | Freq: Every day | ORAL | Status: DC
  Administered 2016-07-16: 40 mg via ORAL
  Filled 2016-07-15: qty 1

## 2016-07-15 NOTE — ED Provider Notes (Signed)
[facility] Emergency Department Provider Note   ____________________________________________    I have reviewed the triage vital signs and the nursing notes.   HISTORY  Chief Complaint Chest Pain and Headache     HPI Michele Hubbard is a 81 y.o. female who presents with complaints of chest pressure, elevated blood pressure, and headache. Patient reports headache is at times global but at times centered over her left temple. She reports it is improved this morning after taking medication at home. She also reports she has had chest pressure for the last several days this does not seem to worsen although she does report what appears to be somewhat new shortness of breath with exertion. No fevers chills cough. No diaphoresis. She became concerned when she noticed that her high blood pressure was elevated so she came to the emergency department.   Past Medical History:  Diagnosis Date  . Diverticulosis   . Hypertension   . Thyroid disease     There are no active problems to display for this patient.   Past Surgical History:  Procedure Laterality Date  . ABDOMINAL HYSTERECTOMY    . THYROID SURGERY      Prior to Admission medications   Medication Sig Start Date End Date Taking? Authorizing Provider  albuterol (PROVENTIL HFA;VENTOLIN HFA) 108 (90 BASE) MCG/ACT inhaler Inhale into the lungs every 6 (six) hours as needed for wheezing or shortness of breath.   Yes Historical Provider, MD  allopurinol (ZYLOPRIM) 100 MG tablet Take 100 mg by mouth daily.    Yes Historical Provider, MD  alum & mag hydroxide-simeth (MAALOX/MYLANTA) 200-200-20 MG/5ML suspension Take 15 mLs by mouth every 6 (six) hours as needed for indigestion or heartburn.   Yes Historical Provider, MD  amLODipine (NORVASC) 5 MG tablet Take 5 mg by mouth daily.   Yes Historical Provider, MD  aspirin 81 MG chewable tablet Chew 81 mg by mouth daily.   Yes Historical Provider, MD  atenolol  (TENORMIN) 50 MG tablet Take 50 mg by mouth daily.    Yes Historical Provider, MD  atorvastatin (LIPITOR) 20 MG tablet Take 20 mg by mouth daily.   Yes Historical Provider, MD  esomeprazole (NEXIUM) 20 MG capsule Take 20 mg by mouth daily.    Yes Historical Provider, MD  KRILL OIL PO Take 1 capsule by mouth daily.   Yes Historical Provider, MD  levothyroxine (SYNTHROID, LEVOTHROID) 75 MCG tablet Take 75 mcg by mouth daily before breakfast.   Yes Historical Provider, MD  losartan-hydrochlorothiazide (HYZAAR) 100-25 MG per tablet Take 1 tablet by mouth daily.   Yes Historical Provider, MD  Multiple Vitamins-Minerals (MULTIVITAMIN ADULT) CHEW Chew 1 tablet by mouth 2 (two) times daily.   Yes Historical Provider, MD  triamcinolone (NASACORT ALLERGY 24HR) 55 MCG/ACT AERO nasal inhaler Place 2 sprays into the nose daily.   Yes Historical Provider, MD  Vitamin D, Cholecalciferol, 1000 units CAPS Take 1,000 Units by mouth 2 (two) times daily.   Yes Historical Provider, MD  clobetasol ointment (TEMOVATE) 9.32 % Apply 1 application topically daily as needed.     Historical Provider, MD  colchicine 0.6 MG tablet Take 0.6 mg by mouth daily.    Historical Provider, MD  meclizine (ANTIVERT) 25 MG tablet Take 12.5 mg by mouth once.     Historical Provider, MD     Allergies Penicillins  Family History  Problem Relation Age of Onset  . Heart failure Father     Social History  Social History  Substance Use Topics  . Smoking status: Never Smoker  . Smokeless tobacco: Never Used  . Alcohol use No    Review of Systems  Constitutional: No fever/chills Eyes: No visual changes.   Cardiovascular: As above Respiratory: Denies cough Gastrointestinal: No abdominal pain.    Musculoskeletal: Negative for neck pain Skin: Negative for rash. Neurological: Negative for focal weakness  10-point ROS otherwise negative.  ____________________________________________   PHYSICAL EXAM:  VITAL SIGNS: ED  Triage Vitals  Enc Vitals Group     BP 07/15/16 0822 (!) 190/83     Pulse Rate 07/15/16 0822 60     Resp --      Temp 07/15/16 0822 97.7 F (36.5 C)     Temp Source 07/15/16 0822 Oral     SpO2 07/15/16 0822 98 %     Weight 07/15/16 0823 134 lb (60.8 kg)     Height 07/15/16 0823 _0  (1.575 m)     Head Circumference --      Peak Flow --      Pain Score 07/15/16 0824 4     Pain Loc --      Pain Edu? --      Excl. in [place]? --     Constitutional: Alert and oriented. No acute distress. Pleasant and interactive. Well appearing Eyes: Conjunctivae are normal.  Head: Atraumatic. Nose: No congestion/rhinnorhea. Mouth/Throat: Mucous membranes are moist.    Cardiovascular: Normal rate, regular rhythm. Grossly normal heart sounds.  Good peripheral circulation. Respiratory: Normal respiratory effort.  No retractions. Lungs CTAB. Gastrointestinal: Soft and nontender. No distention.   Genitourinary: deferred Musculoskeletal: No lower extremity tenderness nor edema.  Warm and well perfused Neurologic:  Normal speech and language. No gross focal neurologic deficits are appreciated.  Skin:  Skin is warm, dry and intact. No rash noted. Psychiatric: Mood and affect are normal. Speech and behavior are normal.  ____________________________________________   LABS (all labs ordered are listed, but only abnormal results are displayed)  Labs Reviewed  CBC - Abnormal; Notable for the following:       Result Value   WBC 12.7 (*)    All other components within normal limits  COMPREHENSIVE METABOLIC PANEL - Abnormal; Notable for the following:    Sodium 133 (*)    Chloride 99 (*)    Glucose, Bld 106 (*)    BUN 22 (*)    Creatinine, Ser 1.03 (*)    Total Protein 8.2 (*)    GFR calc non Af Amer 50 (*)    GFR calc Af Amer 58 (*)    All other components within normal limits  BRAIN NATRIURETIC PEPTIDE - Abnormal; Notable for the following:    B Natriuretic Peptide 239.0 (*)    All other components  within normal limits  TROPONIN I  SEDIMENTATION RATE   ____________________________________________  EKG  ED ECG REPORT I, Lavonia Drafts, the attending physician, personally viewed and interpreted this ECG.  Date: 07/15/2016 EKG Time: 8:17 AM Rate: 66 Rhythm: normal sinus rhythm QRS Axis: normal Intervals: normal ST/T Wave abnormalities: Nonspecific changes Conduction Disturbances: none   ____________________________________________  RADIOLOGY  Chest x-ray unremarkable ____________________________________________   PROCEDURES  Procedure(s) performed: No    Critical Care performed: No ____________________________________________   INITIAL IMPRESSION / ASSESSMENT AND PLAN / ED COURSE  Pertinent labs & imaging results that were available during my care of the patient were reviewed by me and considered in my medical decision making (see chart for details).  Patient overall very well-appearing and in no distress. EKG is unremarkable. We will check labs, chest x-ray, ESR given left sided headache and age  Clinical Course  ESR is normal and in fact headache has resolved  Patient continues to have mild pressure-like chest pain. Given this I will admit her to the hospitalist service ____________________________________________   FINAL CLINICAL IMPRESSION(S) / ED DIAGNOSES  Final diagnoses:  Nonspecific chest pain      NEW MEDICATIONS STARTED DURING THIS VISIT:  New Prescriptions   No medications on file     Note:  This document was prepared using Dragon voice recognition software and may include unintentional dictation errors.    Lavonia Drafts, MD 07/15/16 1225

## 2016-07-15 NOTE — Care Management Obs Status (Signed)
MEDICARE OBSERVATION STATUS NOTIFICATION   Patient Details  Name: Michele Hubbard MRN: [id number] Date of Birth: 07-11-1935   Medicare Observation Status Notification Given:   Yes    Berna BueCheryl Kanen Mottola, RN 07/15/2016, 12:51 PM

## 2016-07-15 NOTE — ED Triage Notes (Signed)
Pt with chest pain that started Monday that pt described as pressure central chest. States that she has SOB with activity. Headache started last nite. Pt has taken tylenol 0400 this am

## 2016-07-15 NOTE — ED Notes (Signed)
Informed RN bed ready 

## 2016-07-15 NOTE — Care Management (Signed)
Placed in observation for chest pain.  Troponins negative and for stress test 9.22.  Independent in all adls, denies issues accessing medical care, obtaining medications or with transportation.  Current with her PCP.  No discharge needs identified at present by care manager or members of care team

## 2016-07-15 NOTE — H&P (Signed)
[facility] [place] [facility]   PATIENT NAME: Michele Hubbard    MR#:  [id number]  DATE OF BIRTH:  [birth year]  DATE OF ADMISSION:  07/15/2016  PRIMARY CARE PHYSICIAN: Leim Fabry, MD   REQUESTING/REFERRING PHYSICIAN: Dr. Cyril Loosen  CHIEF COMPLAINT:   Chest pressure HISTORY OF PRESENT ILLNESS:  Michele Hubbard  is a 81 y.o. female with a known history of Essential hypertension and hypothyroidism who presents with above complaint. Patient reports since Monday she's had on and off chest pressure. Her chest pressure does not radiate to the jaw or to the arm. It comes and goes she says. It lasts sometimes seconds and sometimes minutes. There is no exacerbating or relieving factors. She initially thought this was due to hernia so she started on a bland diet. Her chest pressure symptoms did not improve so she comes to the ER for further evaluation. In the emergency room EKG showed no evidence of acute coronary syndrome and first set of troponins are negative. She is not currently having any chest pressure.  PAST MEDICAL HISTORY:   Past Medical History:  Diagnosis Date  . Diverticulosis   . Hypertension   . Thyroid disease     PAST SURGICAL HISTORY:   Past Surgical History:  Procedure Laterality Date  . ABDOMINAL HYSTERECTOMY    . THYROID SURGERY      SOCIAL HISTORY:   Social History  Substance Use Topics  . Smoking status: Never Smoker  . Smokeless tobacco: Never Used  . Alcohol use No    FAMILY HISTORY:   Family History  Problem Relation Age of Onset  . Heart failure Father     DRUG ALLERGIES:   Allergies  Allergen Reactions  . Penicillins Other (See Comments)    Doesn't remember. Has patient had a PCN reaction causing immediate rash, facial/tongue/throat swelling, SOB or lightheadedness with hypotension: no Has patient had a PCN reaction causing severe rash involving mucus membranes or skin necrosis: No Has patient had a PCN reaction that  required hospitalization No Has patient had a PCN reaction occurring within the last 10 years: no If all of the above answers are "NO", then may proceed with Cephalosporin use.     REVIEW OF SYSTEMS:   Review of Systems  Constitutional: Negative.  Negative for chills, fever and malaise/fatigue.  HENT: Negative for ear discharge, ear pain, hearing loss, nosebleeds and sore throat.   Eyes: Negative.  Negative for blurred vision and pain.  Respiratory: Negative.  Negative for cough, hemoptysis, shortness of breath and wheezing.   Cardiovascular: Positive for chest pain (chest pressure). Negative for palpitations and leg swelling.  Gastrointestinal: Negative.  Negative for abdominal pain, blood in stool, diarrhea, nausea and vomiting.  Genitourinary: Negative.  Negative for dysuria.  Musculoskeletal: Negative.  Negative for back pain.  Skin: Negative.   Neurological: Positive for headaches. Negative for dizziness, tremors, speech change, focal weakness and seizures.  Endo/Heme/Allergies: Negative.  Does not bruise/bleed easily.  Psychiatric/Behavioral: Negative.  Negative for depression, hallucinations and suicidal ideas.    MEDICATIONS AT HOME:   Prior to Admission medications   Medication Sig Start Date End Date Taking? Authorizing Provider  albuterol (PROVENTIL HFA;VENTOLIN HFA) 108 (90 BASE) MCG/ACT inhaler Inhale into the lungs every 6 (six) hours as needed for wheezing or shortness of breath.   Yes Historical Provider, MD  allopurinol (ZYLOPRIM) 100 MG tablet Take 100 mg by mouth daily.    Yes Historical Provider, MD  alum & mag hydroxide-simeth (MAALOX/MYLANTA) 200-200-20  MG/5ML suspension Take 15 mLs by mouth every 6 (six) hours as needed for indigestion or heartburn.   Yes Historical Provider, MD  amLODipine (NORVASC) 5 MG tablet Take 5 mg by mouth daily.   Yes Historical Provider, MD  aspirin 81 MG chewable tablet Chew 81 mg by mouth daily.   Yes Historical Provider, MD   atenolol (TENORMIN) 50 MG tablet Take 50 mg by mouth daily.    Yes Historical Provider, MD  atorvastatin (LIPITOR) 20 MG tablet Take 20 mg by mouth daily.   Yes Historical Provider, MD  esomeprazole (NEXIUM) 20 MG capsule Take 20 mg by mouth daily.    Yes Historical Provider, MD  KRILL OIL PO Take 1 capsule by mouth daily.   Yes Historical Provider, MD  levothyroxine (SYNTHROID, LEVOTHROID) 75 MCG tablet Take 75 mcg by mouth daily before breakfast.   Yes Historical Provider, MD  losartan-hydrochlorothiazide (HYZAAR) 100-25 MG per tablet Take 1 tablet by mouth daily.   Yes Historical Provider, MD  Multiple Vitamins-Minerals (MULTIVITAMIN ADULT) CHEW Chew 1 tablet by mouth 2 (two) times daily.   Yes Historical Provider, MD  triamcinolone (NASACORT ALLERGY 24HR) 55 MCG/ACT AERO nasal inhaler Place 2 sprays into the nose daily.   Yes Historical Provider, MD  Vitamin D, Cholecalciferol, 1000 units CAPS Take 1,000 Units by mouth 2 (two) times daily.   Yes Historical Provider, MD  clobetasol ointment (TEMOVATE) 0.05 % Apply 1 application topically daily as needed.     Historical Provider, MD  colchicine 0.6 MG tablet Take 0.6 mg by mouth daily.    Historical Provider, MD  meclizine (ANTIVERT) 25 MG tablet Take 12.5 mg by mouth once.     Historical Provider, MD      VITAL SIGNS:  Blood pressure (!) 163/73, pulse (!) 54, temperature 97.7 F (36.5 C), temperature source Oral, resp. rate 12, height 5\' 2"  (1.575 m), weight 60.8 kg (134 lb), SpO2 98 %.  PHYSICAL EXAMINATION:   Physical Exam  Constitutional: She is oriented to person, place, and time and well-developed, well-nourished, and in no distress. No distress.  HENT:  Head: Normocephalic.  Eyes: No scleral icterus.  Neck: Normal range of motion. Neck supple. No JVD present. No tracheal deviation present.  Cardiovascular: Normal rate, regular rhythm and normal heart sounds.  Exam reveals no gallop and no friction rub.   No murmur heard. There  is pinpoint tenderness to epigastric region  Pulmonary/Chest: Effort normal and breath sounds normal. No respiratory distress. She has no wheezes. She has no rales. She exhibits no tenderness.  Abdominal: Soft. Bowel sounds are normal. She exhibits no distension and no mass. There is no tenderness. There is no rebound and no guarding.  Musculoskeletal: Normal range of motion. She exhibits no edema.  Neurological: She is alert and oriented to person, place, and time.  Skin: Skin is warm. No rash noted. No erythema.  Psychiatric: Affect and judgment normal.      LABORATORY PANEL:   CBC  Recent Labs Lab 07/15/16 0837  WBC 12.7*  HGB 12.0  HCT 35.1  PLT 339   ------------------------------------------------------------------------------------------------------------------  Chemistries   Recent Labs Lab 07/15/16 0837  NA 133*  K 3.8  CL 99*  CO2 25  GLUCOSE 106*  BUN 22*  CREATININE 1.03*  CALCIUM 9.9  AST 23  ALT 15  ALKPHOS 58  BILITOT 0.8   ------------------------------------------------------------------------------------------------------------------  Cardiac Enzymes  Recent Labs Lab 07/15/16 0837  TROPONINI <0.03   ------------------------------------------------------------------------------------------------------------------  RADIOLOGY:  Dg  Chest Portable 1 View  Result Date: 07/15/2016 CLINICAL DATA:  Chest pain EXAM: PORTABLE CHEST 1 VIEW COMPARISON:  12/03/2013 FINDINGS: The heart size is mildly enlarged but stable. Both lungs are clear. The visualized skeletal structures are unremarkable. IMPRESSION: No active disease. Electronically Signed   By: Alcide CleverMark  Lukens M.D.   On: 07/15/2016 08:48    EKG:  Normal sinus rhythm no ST elevation or depression IMPRESSION AND PLAN:   81 year old female with a history of hypertension who presents with chest pressure.  1. Chest pressure: Patient will be admitted to telemetry unit. Continue to cycle cardiac  enzymes. Troponins 3 are negative and patient may proceed to cardiac stress test in a.m. Of note patient does have tenderness at the substernal region where she is describing pain. If stress test is negative this may be due to GI or muscle skeletal issue.  2. Accelerated on essential hypertension: Restart patient's outpatient medications and follow blood pressure. Her outpatient medications include Norvasc, atenolol, Hyzaar. 3. 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 Hyperlipidemia: Continue atorvastatin.   All the records are reviewed and case discussed with ED provider. Management plans discussed with the patient and she in agreement  CODE STATUS: full  TOTAL TIME TAKING CARE OF THIS PATIENT: 50 minutes.    Michele Hubbard M.D on 07/15/2016 at 12:57 PM  Between 7am to 6pm - Pager - [phone]  After 6pm go to [email or web address] - Social research officer, [email or web address] [email or web address]  [facility]  Office  [phone]  CC: Primary care physician; Leim FabryALDRIDGE,BARBARA, MD

## 2016-07-16 ENCOUNTER — Observation Stay: Payer: Medicare Other

## 2016-07-16 DIAGNOSIS — R079 Chest pain, unspecified: Secondary | ICD-10-CM | POA: Diagnosis not present

## 2016-07-16 LAB — BASIC METABOLIC PANEL
ANION GAP: 4 — AB (ref 5–15)
BUN: 25 mg/dL — ABNORMAL HIGH (ref 6–20)
CALCIUM: 9.7 mg/dL (ref 8.9–10.3)
CO2: 30 mmol/L (ref 22–32)
Chloride: 102 mmol/L (ref 101–111)
Creatinine, Ser: 1.01 mg/dL — ABNORMAL HIGH (ref 0.44–1.00)
GFR, EST AFRICAN AMERICAN: 60 mL/min — AB (ref >60)
GFR, EST NON AFRICAN AMERICAN: 52 mL/min — AB (ref >60)
GLUCOSE: 100 mg/dL — AB (ref 65–99)
Potassium: 4.7 mmol/L (ref 3.5–5.1)
SODIUM: 136 mmol/L (ref 135–145)

## 2016-07-16 LAB — LIPID PANEL
Cholesterol: 142 mg/dL (ref 0–200)
HDL: 28 mg/dL — ABNORMAL LOW (ref >40)
LDL CALC: 81 mg/dL (ref 0–99)
Total CHOL/HDL Ratio: 5.1 RATIO
Triglycerides: 163 mg/dL — ABNORMAL HIGH (ref <150)
VLDL: 33 mg/dL (ref 0–40)

## 2016-07-16 LAB — CBC
HCT: 32.4 % — ABNORMAL LOW (ref 35.0–47.0)
HEMOGLOBIN: 11.3 g/dL — AB (ref 12.0–16.0)
MCH: 30.9 pg (ref 26.0–34.0)
MCHC: 34.8 g/dL (ref 32.0–36.0)
MCV: 88.8 fL (ref 80.0–100.0)
Platelets: 314 10*3/uL (ref 150–440)
RBC: 3.65 MIL/uL — AB (ref 3.80–5.20)
RDW: 13.7 % (ref 11.5–14.5)
WBC: 8.4 10*3/uL (ref 3.6–11.0)

## 2016-07-16 LAB — NM MYOCAR MULTI W/SPECT W/WALL MOTION / EF
CHL CUP NUCLEAR SDS: 1
CHL CUP NUCLEAR SRS: 3
CSEPPHR: 96 {beats}/min
LV dias vol: 45 mL (ref 46–106)
LV sys vol: 13 mL
NUC STRESS TID: 0.9
Rest HR: 72 {beats}/min
SSS: 3

## 2016-07-16 MED ORDER — REGADENOSON 0.4 MG/5ML IV SOLN
0.4000 mg | Freq: Once | INTRAVENOUS | Status: DC
  Filled 2016-07-16: qty 5

## 2016-07-16 MED ORDER — TECHNETIUM TC 99M TETROFOSMIN IV KIT
12.3700 | PACK | Freq: Once | INTRAVENOUS | Status: AC | PRN
  Administered 2016-07-16: 12.37 via INTRAVENOUS

## 2016-07-16 MED ORDER — TECHNETIUM TC 99M TETROFOSMIN IV KIT
30.0000 | PACK | Freq: Once | INTRAVENOUS | Status: AC | PRN
  Administered 2016-07-16: 30.237 via INTRAVENOUS

## 2016-07-16 NOTE — Discharge Summary (Signed)
[facility] [place] [facility]   PATIENT NAME: Michele Hubbard    MR#:  [id number]  DATE OF BIRTH:  06-23-1935  DATE OF ADMISSION:  07/15/2016 ADMITTING PHYSICIAN: Adrian Saran, MD  DATE OF DISCHARGE: 07/16/16  PRIMARY CARE PHYSICIAN: ALDRIDGE,BARBARA, MD    ADMISSION DIAGNOSIS:  Chest pain [R07.9] Nonspecific chest pain [R07.9]  DISCHARGE DIAGNOSIS:  Chest pain  SECONDARY DIAGNOSIS:   Past Medical History:  Diagnosis Date  . Diverticulosis   . Hypertension   . Thyroid disease     HOSPITAL COURSE:   81 year old female with a history of hypertension who presents with chest pressure.  1. Chest pressure: Troponins 3 are negative  -s/p stress test. awaiting results -pt is CP free, vitals stable  2. Accelerated on essential hypertension:  -Restart patient's outpatient medications and follow blood pressure. Her outpatient medications include Norvasc, atenolol, Hyzaar.  3. 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 Hyperlipidemia: Continue atorvastatin.   Will d/c home if myoview is neg CONSULTS OBTAINED:    DRUG ALLERGIES:   Allergies  Allergen Reactions  . Penicillins Other (See Comments)    Doesn't remember. Has patient had a PCN reaction causing immediate rash, facial/tongue/throat swelling, SOB or lightheadedness with hypotension: no Has patient had a PCN reaction causing severe rash involving mucus membranes or skin necrosis: No Has patient had a PCN reaction that required hospitalization No Has patient had a PCN reaction occurring within the last 10 years: no If all of the above answers are "NO", then may proceed with Cephalosporin use.     DISCHARGE MEDICATIONS:   Current Discharge Medication List    CONTINUE these medications which have NOT CHANGED   Details  albuterol (PROVENTIL HFA;VENTOLIN HFA) 108 (90 BASE) MCG/ACT inhaler Inhale into the lungs every 6 (six) hours as needed for wheezing  or shortness of breath.    allopurinol (ZYLOPRIM) 100 MG tablet Take 100 mg by mouth daily.     alum & mag hydroxide-simeth (MAALOX/MYLANTA) 200-200-20 MG/5ML suspension Take 15 mLs by mouth every 6 (six) hours as needed for indigestion or heartburn.    amLODipine (NORVASC) 5 MG tablet Take 5 mg by mouth daily.    aspirin 81 MG chewable tablet Chew 81 mg by mouth daily.    atenolol (TENORMIN) 50 MG tablet Take 50 mg by mouth daily.     atorvastatin (LIPITOR) 20 MG tablet Take 20 mg by mouth daily.    esomeprazole (NEXIUM) 20 MG capsule Take 20 mg by mouth daily.     KRILL OIL PO Take 1 capsule by mouth daily.    levothyroxine (SYNTHROID, LEVOTHROID) 75 MCG tablet Take 75 mcg by mouth daily before breakfast.    losartan-hydrochlorothiazide (HYZAAR) 100-25 MG per tablet Take 1 tablet by mouth daily.    Multiple Vitamins-Minerals (MULTIVITAMIN ADULT) CHEW Chew 1 tablet by mouth 2 (two) times daily.    triamcinolone (NASACORT ALLERGY 24HR) 55 MCG/ACT AERO nasal inhaler Place 2 sprays into the nose daily.    Vitamin D, Cholecalciferol, 1000 units CAPS Take 1,000 Units by mouth 2 (two) times daily.    clobetasol ointment (TEMOVATE) 0.05 % Apply 1 application topically daily as needed.     colchicine 0.6 MG tablet Take 0.6 mg by mouth daily.    meclizine (ANTIVERT) 25 MG tablet Take 12.5 mg by mouth once.         If you experience worsening of your admission symptoms, develop shortness of breath, life threatening emergency, suicidal or  homicidal thoughts you must seek medical attention immediately by calling 911 or calling your MD immediately  if symptoms less severe.  You Must read complete instructions/literature along with all the possible adverse reactions/side effects for all the Medicines you take and that have been prescribed to you. Take any new Medicines after you have completely understood and accept all the possible adverse reactions/side effects.   Please note  You  were cared for by a hospitalist during your hospital stay. If you have any questions about your discharge medications or the care you received while you were in the hospital after you are discharged, you can call the unit and asked to speak with the hospitalist on call if the hospitalist that took care of you is not available. Once you are discharged, your primary care physician will handle any further medical issues. Please note that NO REFILLS for any discharge medications will be authorized once you are discharged, as it is imperative that you return to your primary care physician (or establish a relationship with a primary care physician if you do not have one) for your aftercare needs so that they can reassess your need for medications and monitor your lab values. Today   SUBJECTIVE   No complaints  VITAL SIGNS:  Blood pressure (!) 141/83, pulse 68, temperature 98.3 F (36.8 C), temperature source Oral, resp. rate 18, height 5\' 2"  (1.575 m), weight 59.1 kg (130 lb 6.4 oz), SpO2 100 %.  I/O:   Intake/Output Summary (Last 24 hours) at 07/16/16 1433 Last data filed at 07/16/16 1300  Gross per 24 hour  Intake           1327.5 ml  Output             1300 ml  Net             27.5 ml    PHYSICAL EXAMINATION:  GENERAL:  81 y.o.-year-old patient lying in the bed with no acute distress.  EYES: Pupils equal, round, reactive to light and accommodation. No scleral icterus. Extraocular muscles intact.  HEENT: Head atraumatic, normocephalic. Oropharynx and nasopharynx clear.  NECK:  Supple, no jugular venous distention. No thyroid enlargement, no tenderness.  LUNGS: Normal breath sounds bilaterally, no wheezing, rales,rhonchi or crepitation. No use of accessory muscles of respiration.  CARDIOVASCULAR: S1, S2 normal. No murmurs, rubs, or gallops.  ABDOMEN: Soft, non-tender, non-distended. Bowel sounds present. No organomegaly or mass.  EXTREMITIES: No pedal edema, cyanosis, or clubbing.   NEUROLOGIC: Cranial nerves II through XII are intact. Muscle strength 5/5 in all extremities. Sensation intact. Gait not checked.  PSYCHIATRIC: The patient is alert and oriented x 3.  SKIN: No obvious rash, lesion, or ulcer.   DATA REVIEW:   CBC   Recent Labs Lab 07/16/16 0455  WBC 8.4  HGB 11.3*  HCT 32.4*  PLT 314    Chemistries   Recent Labs Lab 07/15/16 0837  07/16/16 0455  NA 133*  --  136  K 3.8  --  4.7  CL 99*  --  102  CO2 25  --  30  GLUCOSE 106*  --  100*  BUN 22*  --  25*  CREATININE 1.03*  < > 1.01*  CALCIUM 9.9  --  9.7  AST 23  --   --   ALT 15  --   --   ALKPHOS 58  --   --   BILITOT 0.8  --   --   < > = values  in this interval not displayed.  Microbiology Results   No results found for this or any previous visit (from the past 240 hour(s)).  RADIOLOGY:  Dg Chest Portable 1 View  Result Date: 07/15/2016 CLINICAL DATA:  Chest pain EXAM: PORTABLE CHEST 1 VIEW COMPARISON:  12/03/2013 FINDINGS: The heart size is mildly enlarged but stable. Both lungs are clear. The visualized skeletal structures are unremarkable. IMPRESSION: No active disease. Electronically Signed   By: Alcide Clever M.D.   On: 07/15/2016 08:48     Management plans discussed with the patient, family and they are in agreement.  CODE STATUS:     Code Status Orders        Start     Ordered   07/15/16 1420  Full code  Continuous     07/15/16 1419    Code Status History    Date Active Date Inactive Code Status Order ID Comments User Context   This patient has a current code status but no historical code status.      TOTAL TIME TAKING CARE OF THIS PATIENT: 40 minutes.    Dodge Ator M.D on 07/16/2016 at 2:33 PM  Between 7am to 6pm - Pager - [phone] After 6pm go to [email or web address] - password [email or web address] [facility]  [facility]  Office  [phone]  CC: Primary care physician; Leim Fabry, MD

## 2016-07-16 NOTE — Care Management (Signed)
Anticipate discharge home with no needs if stress is negative

## 2016-07-16 NOTE — Care Management Obs Status (Signed)
MEDICARE OBSERVATION STATUS NOTIFICATION   Patient Details  Name: Michele Hubbard MRN: [id number] Date of Birth: 05-Sep-1935   Medicare Observation Status Notification Given:  Yes    Eber HongGreene, Carolyn Maniscalco R, RN 07/16/2016, 3:39 PM

## 2016-07-16 NOTE — Progress Notes (Signed)
Patient is being discharge after a negative stress test result , in a stable condition, summary and f/u care given , verbalized understanding , left with family.

## 2017-05-24 ENCOUNTER — Other Ambulatory Visit: Payer: Self-pay

## 2017-05-24 ENCOUNTER — Encounter: Payer: Self-pay | Admitting: Oncology

## 2017-05-24 ENCOUNTER — Inpatient Hospital Stay: Payer: Medicare Other

## 2017-05-24 ENCOUNTER — Inpatient Hospital Stay: Payer: Medicare Other | Attending: Oncology | Admitting: Oncology

## 2017-05-24 VITALS — BP 135/73 | HR 79 | Temp 96.6°F | Resp 18 | Ht 62.99 in | Wt 131.9 lb

## 2017-05-24 DIAGNOSIS — J45909 Unspecified asthma, uncomplicated: Secondary | ICD-10-CM | POA: Insufficient documentation

## 2017-05-24 DIAGNOSIS — E785 Hyperlipidemia, unspecified: Secondary | ICD-10-CM | POA: Insufficient documentation

## 2017-05-24 DIAGNOSIS — I10 Essential (primary) hypertension: Secondary | ICD-10-CM | POA: Diagnosis not present

## 2017-05-24 DIAGNOSIS — E079 Disorder of thyroid, unspecified: Secondary | ICD-10-CM | POA: Diagnosis not present

## 2017-05-24 DIAGNOSIS — R739 Hyperglycemia, unspecified: Secondary | ICD-10-CM | POA: Insufficient documentation

## 2017-05-24 DIAGNOSIS — D649 Anemia, unspecified: Secondary | ICD-10-CM

## 2017-05-24 DIAGNOSIS — E7800 Pure hypercholesterolemia, unspecified: Secondary | ICD-10-CM | POA: Diagnosis not present

## 2017-05-24 DIAGNOSIS — T7840XA Allergy, unspecified, initial encounter: Secondary | ICD-10-CM | POA: Insufficient documentation

## 2017-05-24 DIAGNOSIS — Z79899 Other long term (current) drug therapy: Secondary | ICD-10-CM | POA: Diagnosis not present

## 2017-05-24 DIAGNOSIS — Z7982 Long term (current) use of aspirin: Secondary | ICD-10-CM | POA: Insufficient documentation

## 2017-05-24 DIAGNOSIS — M1A9XX0 Chronic gout, unspecified, without tophus (tophi): Secondary | ICD-10-CM | POA: Insufficient documentation

## 2017-05-24 DIAGNOSIS — K219 Gastro-esophageal reflux disease without esophagitis: Secondary | ICD-10-CM | POA: Insufficient documentation

## 2017-05-24 LAB — RETICULOCYTES
RBC.: 3.87 MIL/uL (ref 3.80–5.20)
RETIC COUNT ABSOLUTE: 38.7 10*3/uL (ref 19.0–183.0)
Retic Ct Pct: 1 % (ref 0.4–3.1)

## 2017-05-24 LAB — CBC WITH DIFFERENTIAL/PLATELET
BASOS ABS: 0 10*3/uL (ref 0–0.1)
BASOS PCT: 1 %
Eosinophils Absolute: 0.2 10*3/uL (ref 0–0.7)
Eosinophils Relative: 2 %
HEMATOCRIT: 33.6 % — AB (ref 35.0–47.0)
HEMOGLOBIN: 11.5 g/dL — AB (ref 12.0–16.0)
LYMPHS PCT: 18 %
Lymphs Abs: 1.5 10*3/uL (ref 1.0–3.6)
MCH: 29.9 pg (ref 26.0–34.0)
MCHC: 34.2 g/dL (ref 32.0–36.0)
MCV: 87.4 fL (ref 80.0–100.0)
MONO ABS: 0.7 10*3/uL (ref 0.2–0.9)
MONOS PCT: 8 %
NEUTROS ABS: 6 10*3/uL (ref 1.4–6.5)
NEUTROS PCT: 71 %
Platelets: 392 10*3/uL (ref 150–440)
RBC: 3.84 MIL/uL (ref 3.80–5.20)
RDW: 14.4 % (ref 11.5–14.5)
WBC: 8.5 10*3/uL (ref 3.6–11.0)

## 2017-05-24 LAB — COMPREHENSIVE METABOLIC PANEL
ALBUMIN: 4.3 g/dL (ref 3.5–5.0)
ALT: 17 U/L (ref 14–54)
AST: 20 U/L (ref 15–41)
Alkaline Phosphatase: 58 U/L (ref 38–126)
Anion gap: 7 (ref 5–15)
BUN: 23 mg/dL — ABNORMAL HIGH (ref 6–20)
CHLORIDE: 99 mmol/L — AB (ref 101–111)
CO2: 26 mmol/L (ref 22–32)
Calcium: 9.7 mg/dL (ref 8.9–10.3)
Creatinine, Ser: 1.02 mg/dL — ABNORMAL HIGH (ref 0.44–1.00)
GFR calc Af Amer: 59 mL/min — ABNORMAL LOW (ref >60)
GFR, EST NON AFRICAN AMERICAN: 51 mL/min — AB (ref >60)
Glucose, Bld: 98 mg/dL (ref 65–99)
POTASSIUM: 4.3 mmol/L (ref 3.5–5.1)
SODIUM: 132 mmol/L — AB (ref 135–145)
Total Bilirubin: 0.6 mg/dL (ref 0.3–1.2)
Total Protein: 7.3 g/dL (ref 6.5–8.1)

## 2017-05-24 LAB — SEDIMENTATION RATE: Sed Rate: 30 mm/hr (ref 0–30)

## 2017-05-24 LAB — TSH: TSH: 0.693 u[IU]/mL (ref 0.350–4.500)

## 2017-05-24 NOTE — Progress Notes (Signed)
Hematology/Oncology Consult note [facility] Telephone:([phone] [phone]  Patient Care Team: Gayland Curry, MD as PCP - General (Family Medicine)   Name of the patient: Michele Hubbard  [id number]  September 26, 1935    Reason for referral- anemia   Referring physician- Dr. Gayland Curry  Date of visit: 05/24/17   History of presenting illness- patient is a 82 year old female with a past medical history significant for hypertension, hypercholesterolemia and chronic constipation. She has been referred to [place] for evaluation and management of anemia. Recent CBC from 04/28/2017 showed white count of 7.6, H&H of 10.9/32.8 and platelet count of 409 and MCV of 88.. Patient's hemoglobin has been between 10-11 for the last 1-1/2 year. Iron studies were within normal limits including a ferritin of 123 and normal TIBC of 333. Folate acid was normal. Haptoglobin was mildly elevated at 215. Serum creatinine was normal at 0.9 but has been mildly elevated between 1.1-1.2 in the past. LFTs from March 2018 were within normal limits. B12 was normal at 627.  She did lose about 8-10 pounds of weight 6-7 months ago which she says was due to bowel issues. Her weight has now stabilized. Occasional joint pain in her knees and b/l fingers  ECOG PS- 1  Pain scale- 0   Review of systems- Review of Systems  Constitutional: Negative for chills, fever, malaise/fatigue and weight loss.  HENT: Negative for congestion, ear discharge and nosebleeds.   Eyes: Negative for blurred vision.  Respiratory: Negative for cough, hemoptysis, sputum production, shortness of breath and wheezing.   Cardiovascular: Negative for chest pain, palpitations, orthopnea and claudication.  Gastrointestinal: Negative for abdominal pain, blood in stool, constipation, diarrhea, heartburn, melena, nausea and vomiting.  Genitourinary: Negative for dysuria, flank pain, frequency, hematuria and urgency.    Musculoskeletal: Positive for joint pain. Negative for back pain and myalgias.  Skin: Negative for rash.  Neurological: Negative for dizziness, tingling, focal weakness, seizures, weakness and headaches.  Endo/Heme/Allergies: Does not bruise/bleed easily.  Psychiatric/Behavioral: Negative for depression and suicidal ideas. The patient does not have insomnia.     Allergies  Allergen Reactions  . Penicillins Other (See Comments)    Doesn't remember. Has patient had a PCN reaction causing immediate rash, facial/tongue/throat swelling, SOB or lightheadedness with hypotension: no Has patient had a PCN reaction causing severe rash involving mucus membranes or skin necrosis: No Has patient had a PCN reaction that required hospitalization No Has patient had a PCN reaction occurring within the last 10 years: no If all of the above answers are "NO", then may proceed with Cephalosporin use.   . Sulfa Antibiotics Rash    Patient Active Problem List   Diagnosis Date Noted  . Allergic state 05/24/2017  . Asthma without status asthmaticus 05/24/2017  . GERD (gastroesophageal reflux disease) 05/24/2017  . Hyperlipidemia, unspecified 05/24/2017  . Chest pain 07/15/2016  . Anemia, mild 01/07/2016  . Chronic gout of multiple sites 12/24/2014  . Essential hypertension 12/24/2014  . Bowel habit changes 09/02/2014  . Gout 01/16/2014  . Hyperglycemia, unspecified 05/29/2012     Past Medical History:  Diagnosis Date  . Diverticulosis   . Hypertension   . Thyroid disease      Past Surgical History:  Procedure Laterality Date  . ABDOMINAL HYSTERECTOMY    . THYROID SURGERY      Social History   Social History  . Marital status: Widowed    Spouse name: N/A  . Number of children: N/A  . Years  of education: N/A   Occupational History  . Not on file.   Social History Main Topics  . Smoking status: Never Smoker  . Smokeless tobacco: Never Used  . Alcohol use No  . Drug use: Unknown   . Sexual activity: Not on file   Other Topics Concern  . Not on file   Social History Narrative  . No narrative on file     Family History  Problem Relation Age of Onset  . Heart failure Father      Current Outpatient Prescriptions:  .  albuterol (PROVENTIL HFA;VENTOLIN HFA) 108 (90 BASE) MCG/ACT inhaler, Inhale into the lungs every 6 (six) hours as needed for wheezing or shortness of breath., Disp: , Rfl:  .  allopurinol (ZYLOPRIM) 100 MG tablet, Take 100 mg by mouth daily. , Disp: , Rfl:  .  alum & mag hydroxide-simeth (MAALOX/MYLANTA) 200-200-20 MG/5ML suspension, Take 15 mLs by mouth every 6 (six) hours as needed for indigestion or heartburn., Disp: , Rfl:  .  amLODipine (NORVASC) 5 MG tablet, Take 5 mg by mouth daily., Disp: , Rfl:  .  aspirin 81 MG chewable tablet, Chew 81 mg by mouth daily., Disp: , Rfl:  .  atenolol (TENORMIN) 50 MG tablet, Take 50 mg by mouth daily. , Disp: , Rfl:  .  atorvastatin (LIPITOR) 20 MG tablet, Take 20 mg by mouth daily., Disp: , Rfl:  .  clobetasol ointment (TEMOVATE) 1.57 %, Apply 1 application topically daily as needed. , Disp: , Rfl:  .  colchicine 0.6 MG tablet, Take 0.6 mg by mouth daily., Disp: , Rfl:  .  esomeprazole (NEXIUM) 20 MG capsule, Take 20 mg by mouth daily. , Disp: , Rfl:  .  KRILL OIL PO, Take 1 capsule by mouth daily., Disp: , Rfl:  .  levothyroxine (SYNTHROID, LEVOTHROID) 75 MCG tablet, Take 75 mcg by mouth daily before breakfast., Disp: , Rfl:  .  losartan-hydrochlorothiazide (HYZAAR) 100-25 MG per tablet, Take 1 tablet by mouth daily., Disp: , Rfl:  .  meclizine (ANTIVERT) 25 MG tablet, Take 12.5 mg by mouth once. , Disp: , Rfl:  .  Multiple Vitamins-Minerals (MULTIVITAMIN ADULT) CHEW, Chew 1 tablet by mouth 2 (two) times daily., Disp: , Rfl:  .  triamcinolone (NASACORT ALLERGY 24HR) 55 MCG/ACT AERO nasal inhaler, Place 2 sprays into the nose daily., Disp: , Rfl:  .  Vitamin D, Cholecalciferol, 1000 units CAPS, Take 1,000  Units by mouth 2 (two) times daily., Disp: , Rfl:    Physical exam:  Vitals:   05/24/17 1026  BP: 135/73  Pulse: 79  Resp: 18  Temp: (!) 96.6 F (35.9 C)  TempSrc: Tympanic  Weight: 131 lb 14.4 oz (59.8 kg)  Height: 5' 2.99" (1.6 m)   Physical Exam  Constitutional: She is oriented to person, place, and time and well-developed, well-nourished, and in no distress.  HENT:  Head: Normocephalic and atraumatic.  Eyes: Pupils are equal, round, and reactive to light. EOM are normal.  Neck: Normal range of motion.  Cardiovascular: Normal rate, regular rhythm and normal heart sounds.   Pulmonary/Chest: Effort normal and breath sounds normal.  Abdominal: Soft. Bowel sounds are normal.  Lymphadenopathy:  No palpable cervical, supraclavicular, axillary or inguinal adenopathy  Neurological: She is alert and oriented to person, place, and time.  Skin: Skin is warm and dry.       CMP Latest Ref Rng & Units 07/16/2016  Glucose 65 - 99 mg/dL 100(H)  BUN 6 -  20 mg/dL 25(H)  Creatinine 0.44 - 1.00 mg/dL 1.01(H)  Sodium 135 - 145 mmol/L 136  Potassium 3.5 - 5.1 mmol/L 4.7  Chloride 101 - 111 mmol/L 102  CO2 22 - 32 mmol/L 30  Calcium 8.9 - 10.3 mg/dL 9.7  Total Protein 6.5 - 8.1 g/dL -  Total Bilirubin 0.3 - 1.2 mg/dL -  Alkaline Phos 38 - 126 U/L -  AST 15 - 41 U/L -  ALT 14 - 54 U/L -   CBC Latest Ref Rng & Units 07/16/2016  WBC 3.6 - 11.0 K/uL 8.4  Hemoglobin 12.0 - 16.0 g/dL 11.3(L)  Hematocrit 35.0 - 47.0 % 32.4(L)  Platelets 150 - 440 K/uL 314     Assessment and plan- Patient is a 82 y.o. female referred for normocytic anemia  Normocytic anemia- unclear etiology. Will check cbc with diff, myeloma panel, cmp, retic count, TSH, ESR, copper. Overall patient has chronic anemia and H/h between 10-11 for last 1-2 years. Anemia could be secondary to underlying medical issues versus bone marrow disorder such as MDS. Given stability of her counts and lack of other cytopenias, I would  not like to do a bone marrow biopsy at this time. I will see her back 4 weeks time to discuss results of her blood work and further management   Thank you for this kind referral and the opportunity to participate in the care of this patient   Visit Diagnosis 1. Normocytic anemia     Dr. Randa Evens, MD, MPH [place] at [facility] Pager- [phone] 05/24/2017

## 2017-05-24 NOTE — Progress Notes (Signed)
Here for new pt evaluation.  

## 2017-05-25 LAB — COPPER, SERUM: COPPER: 119 ug/dL (ref 72–166)

## 2017-05-26 LAB — MULTIPLE MYELOMA PANEL, SERUM
ALBUMIN SERPL ELPH-MCNC: 4 g/dL (ref 2.9–4.4)
ALBUMIN/GLOB SERPL: 1.3 (ref 0.7–1.7)
ALPHA 1: 0.3 g/dL (ref 0.0–0.4)
ALPHA2 GLOB SERPL ELPH-MCNC: 0.9 g/dL (ref 0.4–1.0)
B-GLOBULIN SERPL ELPH-MCNC: 1 g/dL (ref 0.7–1.3)
GAMMA GLOB SERPL ELPH-MCNC: 1.1 g/dL (ref 0.4–1.8)
GLOBULIN, TOTAL: 3.2 g/dL (ref 2.2–3.9)
IGG (IMMUNOGLOBIN G), SERUM: 1069 mg/dL (ref 700–1600)
IgA: 192 mg/dL (ref 64–422)
IgM, Serum: 43 mg/dL (ref 26–217)
Total Protein ELP: 7.2 g/dL (ref 6.0–8.5)

## 2017-06-21 ENCOUNTER — Encounter: Payer: Self-pay | Admitting: Oncology

## 2017-06-21 ENCOUNTER — Inpatient Hospital Stay: Payer: Medicare Other | Attending: Oncology | Admitting: Oncology

## 2017-06-21 VITALS — BP 136/73 | HR 74 | Temp 97.2°F | Ht 62.0 in | Wt 130.2 lb

## 2017-06-21 DIAGNOSIS — E7800 Pure hypercholesterolemia, unspecified: Secondary | ICD-10-CM | POA: Diagnosis not present

## 2017-06-21 DIAGNOSIS — K219 Gastro-esophageal reflux disease without esophagitis: Secondary | ICD-10-CM | POA: Diagnosis not present

## 2017-06-21 DIAGNOSIS — I10 Essential (primary) hypertension: Secondary | ICD-10-CM | POA: Diagnosis not present

## 2017-06-21 DIAGNOSIS — D649 Anemia, unspecified: Secondary | ICD-10-CM | POA: Insufficient documentation

## 2017-06-21 DIAGNOSIS — J45909 Unspecified asthma, uncomplicated: Secondary | ICD-10-CM | POA: Diagnosis not present

## 2017-06-21 DIAGNOSIS — Z79899 Other long term (current) drug therapy: Secondary | ICD-10-CM | POA: Insufficient documentation

## 2017-06-21 DIAGNOSIS — E079 Disorder of thyroid, unspecified: Secondary | ICD-10-CM | POA: Insufficient documentation

## 2017-06-21 DIAGNOSIS — Z7982 Long term (current) use of aspirin: Secondary | ICD-10-CM | POA: Insufficient documentation

## 2017-06-21 NOTE — Progress Notes (Signed)
Hematology/Oncology Consult note [facility] Telephone:([phone] [phone]  Patient Care Team: Gayland Curry, MD as PCP - General (Family Medicine)   Name of the patient: Michele Hubbard  [id number]  [birth year]    Reason for referral- anemia   Referring physician- Dr. Gayland Curry  Date of visit: 06/21/17   History of presenting illness- patient is a 82 year old female with a past medical history significant for hypertension, hypercholesterolemia, diverticulosis, and chronic constipation. She has been referred to [place] for evaluation and management of anemia. CBC from 05/24/17 shows hemoglobin 11.5 and white count 8.5. For past year patient's hemoglobin has ranged from 10-12. Iron studies have been normal, folate was normal. Haptoglobin was mildly elevated at 215. Serum creatinine continues to be mildly elevated at 1.02. LFTs are normal and B12 is normal. She previously reported weight loss of 8-10 pounds due to bowel issues but her weight stabilized and she denies recent weight loss. She does report a possible history of sleep apnea and had been referred for a sleep study by primary care but she declined to go and is not interested in persuing a sleep study at this time. Overall she reports feeling well with plenty of energy. 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    ECOG PS- 1  Pain scale- 0   Review of systems- Review of Systems  Constitutional: Negative for chills, fever, malaise/fatigue and weight loss.  HENT: Negative for congestion, ear discharge and nosebleeds.   Eyes: Negative for blurred vision.  Respiratory: Negative for cough, hemoptysis, sputum production, shortness of breath and wheezing.   Cardiovascular: Negative for chest pain, palpitations, orthopnea and claudication.  Gastrointestinal: Negative for abdominal pain, blood in stool, constipation, diarrhea, heartburn, melena, nausea and  vomiting.  Genitourinary: Negative for dysuria, flank pain, frequency, hematuria and urgency.  Musculoskeletal: Positive for joint pain. Negative for back pain and myalgias.  Skin: Negative for rash.  Neurological: Negative for dizziness, tingling, focal weakness, seizures, weakness and headaches.  Endo/Heme/Allergies: Does not bruise/bleed easily.  Psychiatric/Behavioral: Negative for depression and suicidal ideas. The patient has insomnia.     Allergies  Allergen Reactions  . Penicillins Other (See Comments)    Doesn't remember. Has patient had a PCN reaction causing immediate rash, facial/tongue/throat swelling, SOB or lightheadedness with hypotension: no Has patient had a PCN reaction causing severe rash involving mucus membranes or skin necrosis: No Has patient had a PCN reaction that required hospitalization No Has patient had a PCN reaction occurring within the last 10 years: no If all of the above answers are "NO", then may proceed with Cephalosporin use.   . Sulfa Antibiotics Rash    Patient Active Problem List   Diagnosis Date Noted  . Allergic state 05/24/2017  . Asthma without status asthmaticus 05/24/2017  . GERD (gastroesophageal reflux disease) 05/24/2017  . Hyperlipidemia, unspecified 05/24/2017  . Chest pain 07/15/2016  . Anemia, mild 01/07/2016  . Chronic gout of multiple sites 12/24/2014  . Essential hypertension 12/24/2014  . Bowel habit changes 09/02/2014  . Gout 01/16/2014  . Hyperglycemia, unspecified 05/29/2012     Past Medical History:  Diagnosis Date  . Asthma   . Diverticulosis   . GERD (gastroesophageal reflux disease)   . Hypertension   . Thyroid disease      Past Surgical History:  Procedure Laterality Date  . ABDOMINAL HYSTERECTOMY    . THYROID SURGERY      Social History  Social History  . Marital status: Widowed    Spouse name: N/A  . Number of children: N/A  . Years of education: N/A   Occupational History  . Not on  file.   Social History Main Topics  . Smoking status: Never Smoker  . Smokeless tobacco: Never Used  . Alcohol use No  . Drug use: No  . Sexual activity: No   Other Topics Concern  . Not on file   Social History Narrative  . No narrative on file     Family History  Problem Relation Age of Onset  . Heart failure Father   . COPD Mother   . Osteoporosis Mother   . COPD Brother     Current Outpatient Prescriptions:  .  albuterol (PROVENTIL HFA;VENTOLIN HFA) 108 (90 BASE) MCG/ACT inhaler, Inhale into the lungs every 6 (six) hours as needed for wheezing or shortness of breath., Disp: , Rfl:  .  allopurinol (ZYLOPRIM) 100 MG tablet, Take 100 mg by mouth daily. , Disp: , Rfl:  .  alum & mag hydroxide-simeth (MAALOX/MYLANTA) 200-200-20 MG/5ML suspension, Take 15 mLs by mouth every 6 (six) hours as needed for indigestion or heartburn., Disp: , Rfl:  .  amLODipine (NORVASC) 5 MG tablet, Take 5 mg by mouth daily. , Disp: , Rfl:  .  aspirin 81 MG chewable tablet, Chew 81 mg by mouth daily., Disp: , Rfl:  .  atenolol (TENORMIN) 50 MG tablet, Take 50 mg by mouth daily. , Disp: , Rfl:  .  atorvastatin (LIPITOR) 40 MG tablet, , Disp: , Rfl:  .  Calcium Carbonate-Vitamin D 600-200 MG-UNIT CAPS, Take by mouth., Disp: , Rfl:  .  clobetasol ointment (TEMOVATE) 5.17 %, Apply 1 application topically daily as needed. , Disp: , Rfl:  .  colchicine 0.6 MG tablet, Take 0.6 mg by mouth daily., Disp: , Rfl:  .  cyclobenzaprine (FLEXERIL) 5 MG tablet, Take by mouth., Disp: , Rfl:  .  esomeprazole (NEXIUM) 20 MG capsule, Take 20 mg by mouth daily. , Disp: , Rfl:  .  KRILL OIL PO, Take 1 capsule by mouth daily., Disp: , Rfl:  .  levothyroxine (SYNTHROID, LEVOTHROID) 75 MCG tablet, Take 75 mcg by mouth daily before breakfast., Disp: , Rfl:  .  losartan-hydrochlorothiazide (HYZAAR) 100-25 MG per tablet, Take 1 tablet by mouth daily., Disp: , Rfl:  .  meclizine (ANTIVERT) 25 MG tablet, Take 12.5 mg by mouth  once. , Disp: , Rfl:  .  metoprolol succinate (TOPROL-XL) 50 MG 24 hr tablet, , Disp: , Rfl:  .  montelukast (SINGULAIR) 10 MG tablet, Take by mouth., Disp: , Rfl:  .  Multiple Vitamins-Minerals (MULTIVITAMIN ADULT) CHEW, Chew 1 tablet by mouth 2 (two) times daily., Disp: , Rfl:  .  Omega-3 Fatty Acids (FISH OIL) 1000 MG CAPS, Take by mouth., Disp: , Rfl:  .  triamcinolone (NASACORT ALLERGY 24HR) 55 MCG/ACT AERO nasal inhaler, Place 2 sprays into the nose daily., Disp: , Rfl:  .  Vitamin D, Cholecalciferol, 1000 units CAPS, Take 1,000 Units by mouth 2 (two) times daily., Disp: , Rfl:   Physical exam:  Vitals:   06/21/17 1000  BP: 136/73  Pulse: 74  Temp: (!) 97.2 F (36.2 C)  TempSrc: Tympanic  Weight: 130 lb 3.2 oz (59.1 kg)  Height: '5\' 2"'  (1.575 m)   Physical Exam  Constitutional: She is oriented to person, place, and time and well-developed, well-nourished, and in no distress.  HENT:  Head: Normocephalic  and atraumatic.  Eyes: Pupils are equal, round, and reactive to light. EOM are normal.  Neck: Normal range of motion.  Cardiovascular: Normal rate, regular rhythm and normal heart sounds.   Pulmonary/Chest: Effort normal and breath sounds normal.  Abdominal: Soft. Bowel sounds are normal.  Lymphadenopathy:  No palpable cervical, supraclavicular, axillary or inguinal adenopathy  Neurological: She is alert and oriented to person, place, and time.  Skin: Skin is warm and dry.     CMP Latest Ref Rng & Units 05/24/2017  Glucose 65 - 99 mg/dL 98  BUN 6 - 20 mg/dL 23(H)  Creatinine 0.44 - 1.00 mg/dL 1.02(H)  Sodium 135 - 145 mmol/L 132(L)  Potassium 3.5 - 5.1 mmol/L 4.3  Chloride 101 - 111 mmol/L 99(L)  CO2 22 - 32 mmol/L 26  Calcium 8.9 - 10.3 mg/dL 9.7  Total Protein 6.5 - 8.1 g/dL 7.3  Total Bilirubin 0.3 - 1.2 mg/dL 0.6  Alkaline Phos 38 - 126 U/L 58  AST 15 - 41 U/L 20  ALT 14 - 54 U/L 17   CBC Latest Ref Rng & Units 05/24/2017  WBC 3.6 - 11.0 K/uL 8.5  Hemoglobin  12.0 - 16.0 g/dL 11.5(L)  Hematocrit 35.0 - 47.0 % 33.6(L)  Platelets 150 - 440 K/uL 392     Assessment and plan- Patient is a 82 y.o. female referred for normocytic anemia  Normocytic anemia- suspect that this is anemia of chronic disease. Lab work from 05/24/17 were reassuring with normal copper levels, multiple myeloma panel was normal, TSH, Sed rate, retic count were all normal. I suspect that this anemia is related to an underlying medical condition  versus a bone marrow disorder such as MDS. Given the stability of her counts and lack of other cytopenias I do not feel a bone marrow biopsy is warranted at this time. I will plan to see her back in 4 months to re-evaluate with lab work at that time.    Visit Diagnosis 1. Normocytic anemia      Beckey Rutter, NP 06/21/17 11:05 AM   Dr. Randa Evens, MD, MPH [place] [facility] Pager- [phone] 06/21/2017

## 2017-06-21 NOTE — Progress Notes (Signed)
I have personally performed a a face to face evaluation of this patient and I agree with the history and physical and assessment as documented by NP Beckey Rutter. Hb stable between 11-12. No other cytopenias. Further work up including copper, TSH, myeloma panel unremarkable. Hold off on bone marrow biopsy. RTC in 4 months with cbc/diff  Dr. Randa Evens, MD, MPH [facility] Pager- [phone] 06/21/2017 3:30 PM

## 2017-06-21 NOTE — Progress Notes (Signed)
Patient here for follow up no changes since last appointment 

## 2017-10-21 ENCOUNTER — Inpatient Hospital Stay: Payer: Medicare Other | Admitting: Oncology

## 2017-10-21 ENCOUNTER — Inpatient Hospital Stay: Payer: Medicare Other

## 2018-05-12 ENCOUNTER — Other Ambulatory Visit: Payer: Self-pay | Admitting: Family Medicine

## 2018-05-12 DIAGNOSIS — Z780 Asymptomatic menopausal state: Secondary | ICD-10-CM

## 2018-10-02 ENCOUNTER — Encounter (INDEPENDENT_AMBULATORY_CARE_PROVIDER_SITE_OTHER): Payer: Self-pay | Admitting: Internal Medicine

## 2018-10-10 ENCOUNTER — Ambulatory Visit (INDEPENDENT_AMBULATORY_CARE_PROVIDER_SITE_OTHER): Payer: Medicare Other | Admitting: Family Medicine

## 2018-10-10 ENCOUNTER — Encounter (INDEPENDENT_AMBULATORY_CARE_PROVIDER_SITE_OTHER): Payer: Self-pay | Admitting: Family Medicine

## 2018-10-10 VITALS — BP 152/82 | HR 78 | Temp 98.2°F | Wt 128.0 lb

## 2018-10-10 DIAGNOSIS — E785 Hyperlipidemia, unspecified: Secondary | ICD-10-CM

## 2018-10-10 DIAGNOSIS — I10 Essential (primary) hypertension: Secondary | ICD-10-CM

## 2018-10-10 DIAGNOSIS — R0600 Dyspnea, unspecified: Secondary | ICD-10-CM

## 2018-10-10 MED ORDER — ATORVASTATIN CALCIUM 40 MG PO TABS
40.0000 mg | ORAL_TABLET | Freq: Every day | ORAL | 1 refills | Status: DC
Start: 2018-10-10 — End: 2019-07-09

## 2018-10-10 NOTE — Progress Notes (Signed)
Have you seen any specialists/other providers since your last visit with [place]?    Yes, Urgent care, [facility]     Arm preference verified?   Yes    The patient is due for multiple

## 2018-10-10 NOTE — Progress Notes (Signed)
Subjective:    Date: 10/10/2018 2:51 PM   Patient ID: Leslie Ray is a 83 y.o. female.    Chief Complaint   Patient presents with   . Hospital Follow-up     [facility], GERD    . Referral Request     pulmonology     HPI Pt new to me.  Recent hospitalization at [facility].  Medical records not available via Care Everywhere.   Pt is a previous patient of Dr. Reginia Forts who retired from this office > 10 years ago. Patient has been living in [place] who recently passed away, she is now returned to live with her son in [place], [place].    Pt reports her chest felt "bubbly" went to urgent care on 09/27/2018 and was referred to hospital from urgent care.   Admitted [facility] 09/27/2018- 09/28/2018.  Discharged on azithromycin, prednisone.   She reports improved symptomatology, stated her mucus is not think anymore however still having clear rhinorrhea.  She continues to cough, sneeze, and experience shortness of breath when going up stairs.   + worsening heartburn  No fever/chills.   No history of lung condition.     Problem   Gerd (Gastroesophageal Reflux Disease)   Hyperlipidemia   Essential Hypertension   Gout     Patient Active Problem List   Diagnosis   . Gout   . GERD (gastroesophageal reflux disease)   . Hyperlipidemia   . Essential hypertension     Patient Care Team:  Jules Husbands, DO as PCP - General (Family Medicine)      There is no immunization history on file for this patient.     Current Outpatient Medications   Medication Sig Dispense Refill   . Albuterol Sulfate 108 (90 Base) MCG/ACT Aerosol Powder, Breath Activtivatede Inhale 2 puffs into the lungs every 4 (four) hours as needed        . allopurinol (ZYLOPRIM) 100 MG tablet Take 100 mg by mouth daily     . amLODIPine (NORVASC) 10 MG tablet Take 10 mg by mouth daily     . atorvastatin (LIPITOR) 40 MG tablet Take 1 tablet (40 mg total) by mouth daily 90 tablet 1   . calcium-vitamin D (OSCAL 500/200 D-3) 500-200 MG-UNIT per  tablet Take 1 tablet by mouth 2 (two) times daily 600mg  (1500mg ) - 200U tab        . esomeprazole (NEXIUM) 20 MG capsule Take 20 mg by mouth every morning before breakfast     . levothyroxine (SYNTHROID, LEVOTHROID) 75 MCG tablet Take 75 mcg by mouth daily BRAND NAME ONLY       . Loratadine (CLARITIN PO) Take 5 mg by mouth daily        . losartan (COZAAR) 100 MG tablet Take 100 mg by mouth daily     . meclizine (ANTIVERT) 12.5 MG tablet Take 25 mg by mouth every 8 (eight) hours as needed     . metoprolol succinate XL (TOPROL-XL) 50 MG 24 hr tablet Take 50 mg by mouth daily     . montelukast (SINGULAIR) 10 MG tablet Take 10 mg by mouth nightly     . Omega-3 Fatty Acids (OMEGA-3 FISH OIL) 500 MG Cap Take 2 capsules by mouth 2 (two) times daily       No current facility-administered medications for this visit.      Allergies   Allergen Reactions   . Penicillins        Past  Medical History:   Diagnosis Date   . Allergies    . Anemia    . Arthritis    . Asthma    . Bowel habit changes    . Chest pain    . Disorder of thyroid    . Gastroesophageal reflux disease    . Gout    . Hyperglycemia    . Hyperlipidemia    . Hypertension    . Laxative habit    . Murmur    . Vertigo      Past Surgical History:   Procedure Laterality Date   . BLADDER REPAIR  1999   . CATARACT EXTRACTION, BILATERAL  2002   . HYSTERECTOMY  1999   . THYROIDECTOMY, PARTIAL  1989    tumor removed - patient states was not cancer       Family History   Problem Relation Age of Onset   . Emphysema Mother         smoker   . Heart attack Father 47   . Emphysema Brother        Social History     Socioeconomic History   . Marital status: Widowed     Spouse name: Not on file   . Number of children: Not on file   . Years of education: Not on file   . Highest education level: Not on file   Occupational History   . Not on file   Social Needs   . Financial resource strain: Not on file   . Food insecurity:     Worry: Not on file     Inability: Not on file   .  Transportation needs:     Medical: Not on file     Non-medical: Not on file   Tobacco Use   . Smoking status: Never Smoker   . Smokeless tobacco: Never Used   Substance and Sexual Activity   . Alcohol use: Yes   . Drug use: Never   . Sexual activity: Not on file   Lifestyle   . Physical activity:     Days per week: Not on file     Minutes per session: Not on file   . Stress: Not on file   Relationships   . Social connections:     Talks on phone: Not on file     Gets together: Not on file     Attends religious service: Not on file     Active member of club or organization: Not on file     Attends meetings of clubs or organizations: Not on file     Relationship status: Not on file   . Intimate partner violence:     Fear of current or ex partner: Not on file     Emotionally abused: Not on file     Physically abused: Not on file     Forced sexual activity: Not on file   Other Topics Concern   . Not on file   Social History Narrative   . Not on file     The following portions of the patient's history were reviewed and updated as appropriate: allergies, current medications, past family history, past medical history, past social history, past surgical history and problem list.    Review of Systems   Constitutional: Negative for chills, diaphoresis, fatigue and fever.   HENT: Positive for rhinorrhea. Negative for congestion and sore throat.    Eyes: Negative for pain and visual disturbance.   Respiratory:  Positive for cough and shortness of breath.    Cardiovascular: Negative for chest pain.   Gastrointestinal: Negative for abdominal pain.        + heartburn   Endocrine: Negative for cold intolerance and heat intolerance.   Genitourinary: Negative for difficulty urinating and dysuria.   Musculoskeletal: Negative for arthralgias and back pain.   Skin: Negative for rash.   Neurological: Negative for light-headedness and headaches.   Hematological: Does not bruise/bleed easily.   Psychiatric/Behavioral: Negative for dysphoric  mood. The patient is not nervous/anxious.    All other systems reviewed and are negative.       Objective:   BP 152/82 (BP Site: Right arm)   Pulse 78   Temp 98.2 F (36.8 C) (Oral)   Wt 58.1 kg (128 lb)   SpO2 96%   Wt Readings from Last 3 Encounters:   10/10/18 58.1 kg (128 lb)     Physical Exam   Constitutional: She is oriented to person, place, and time. She appears well-developed and well-nourished. She is cooperative. No distress.   HENT:   Head: Normocephalic and atraumatic.   Right Ear: Hearing, tympanic membrane, external ear and ear canal normal.   Left Ear: Hearing, tympanic membrane, external ear and ear canal normal.   Nose: Right sinus exhibits no maxillary sinus tenderness and no frontal sinus tenderness. Left sinus exhibits no maxillary sinus tenderness and no frontal sinus tenderness.   Mouth/Throat: Uvula is midline, oropharynx is clear and moist and mucous membranes are normal. Normal dentition.   Lips without lesion   Eyes: Pupils are equal, round, and reactive to light. Conjunctivae and EOM are normal.   Neck: Trachea normal. Neck supple. Carotid bruit is not present. No edema present. No thyroid mass and no thyromegaly present.   Cardiovascular: Normal rate, regular rhythm, S1 normal, S2 normal and intact distal pulses.   No murmur heard.  Pulmonary/Chest: Effort normal. No accessory muscle usage. No respiratory distress. She has decreased breath sounds. She has no wheezes. She has rhonchi. She has no rales.   Diffuse rhonchi   Abdominal: Soft. Normal appearance and bowel sounds are normal. She exhibits no distension and no mass. There is no hepatosplenomegaly. There is no tenderness.   Lymphadenopathy:        Head (right side): No submental, no submandibular and no tonsillar adenopathy present.        Head (left side): No submental, no submandibular and no tonsillar adenopathy present.     She has no cervical adenopathy.        Right: No supraclavicular adenopathy present.        Left: No  supraclavicular adenopathy present.   Neurological: She is alert and oriented to person, place, and time.   Skin: Skin is warm, dry and intact. No rash noted.   No rash in visible areas   Psychiatric: She has a normal mood and affect.   Nursing note and vitals reviewed.         Assessment/Plan:       1. Dyspnea, unspecified type  Recent bronchitis/pneumonia, possible COPD exacerbation  Improving  Needs Pulmonary Function Testing   Referred to pulmonary for evaluation and PFT  Records from [facility] requested   - Albuterol Sulfate 108 (90 Base) MCG/ACT Aerosol Powder, Breath Activtivatede; Inhale 2 puffs into the lungs every 4 (four) hours as needed     - Referral to Pulmonary/Critical Care    2. Hyperlipidemia, unspecified hyperlipidemia type    3. Essential  hypertension    Return in about 4 weeks (around 11/07/2018).    Jules Husbands, DO

## 2018-10-11 ENCOUNTER — Telehealth (INDEPENDENT_AMBULATORY_CARE_PROVIDER_SITE_OTHER): Payer: Self-pay

## 2018-10-11 DIAGNOSIS — E785 Hyperlipidemia, unspecified: Secondary | ICD-10-CM | POA: Insufficient documentation

## 2018-10-11 DIAGNOSIS — I10 Essential (primary) hypertension: Secondary | ICD-10-CM | POA: Insufficient documentation

## 2018-10-11 DIAGNOSIS — K219 Gastro-esophageal reflux disease without esophagitis: Secondary | ICD-10-CM | POA: Insufficient documentation

## 2018-10-11 DIAGNOSIS — M109 Gout, unspecified: Secondary | ICD-10-CM | POA: Insufficient documentation

## 2018-10-11 NOTE — Telephone Encounter (Signed)
Request for medical record sent today to,      [facility] Records     [phone]    [facility]    FAX: [phone]

## 2018-10-12 ENCOUNTER — Encounter (INDEPENDENT_AMBULATORY_CARE_PROVIDER_SITE_OTHER): Payer: Self-pay | Admitting: Family Medicine

## 2018-10-16 ENCOUNTER — Ambulatory Visit (INDEPENDENT_AMBULATORY_CARE_PROVIDER_SITE_OTHER): Payer: Medicare Other | Admitting: Family Medicine

## 2018-10-16 ENCOUNTER — Encounter (INDEPENDENT_AMBULATORY_CARE_PROVIDER_SITE_OTHER): Payer: Self-pay | Admitting: Family Medicine

## 2018-10-20 ENCOUNTER — Other Ambulatory Visit (INDEPENDENT_AMBULATORY_CARE_PROVIDER_SITE_OTHER): Payer: Self-pay | Admitting: Family Medicine

## 2018-10-20 MED ORDER — LEVOTHYROXINE SODIUM 75 MCG PO TABS
75.0000 ug | ORAL_TABLET | Freq: Every day | ORAL | 0 refills | Status: DC
Start: 2018-10-20 — End: 2019-01-18

## 2018-10-20 MED ORDER — METOPROLOL SUCCINATE ER 50 MG PO TB24
50.00 mg | ORAL_TABLET | Freq: Every day | ORAL | 0 refills | Status: DC
Start: 2018-10-20 — End: 2019-01-18

## 2018-10-20 NOTE — Telephone Encounter (Signed)
Name, strength, directions of requested refill(s):    levothyroxine (SYNTHROID, LEVOTHROID) 75 MCG tablet    metoprolol succinate XL (TOPROL-XL) 50 MG 24 hr tablet  (Patient is completely out of this medication)    Pharmacy to send refill to or patient to pick up rx from office (mark requested pharmacy in [place]):      [facility] [address] ([address]), [place] - [address]  [address]  [place] ([place]) [place] [address]  Phone: [phone] Fax: [phone]    Please mark "X" next to the preferred call back number:    Mobile:   Telephone Information:   Mobile [phone]        X   Home: @HOMEPHONE @    Work: @WORKPHONE @          Next visit: 11/07/2018

## 2018-11-07 ENCOUNTER — Ambulatory Visit (INDEPENDENT_AMBULATORY_CARE_PROVIDER_SITE_OTHER): Payer: Medicare Other | Admitting: Family Medicine

## 2018-11-07 ENCOUNTER — Encounter (INDEPENDENT_AMBULATORY_CARE_PROVIDER_SITE_OTHER): Payer: Self-pay | Admitting: Family Medicine

## 2018-11-07 VITALS — BP 148/80 | HR 71 | Temp 97.9°F | Ht 61.5 in | Wt 130.0 lb

## 2018-11-07 DIAGNOSIS — M109 Gout, unspecified: Secondary | ICD-10-CM

## 2018-11-07 DIAGNOSIS — Z13820 Encounter for screening for osteoporosis: Secondary | ICD-10-CM

## 2018-11-07 DIAGNOSIS — I10 Essential (primary) hypertension: Secondary | ICD-10-CM

## 2018-11-07 DIAGNOSIS — E785 Hyperlipidemia, unspecified: Secondary | ICD-10-CM

## 2018-11-07 DIAGNOSIS — K219 Gastro-esophageal reflux disease without esophagitis: Secondary | ICD-10-CM

## 2018-11-07 NOTE — Progress Notes (Signed)
Have you seen any specialists/other providers since your last visit with [place]?    Yes, pulmonologist     Arm preference verified?   Yes    The patient is due for pap smear and DXA scan

## 2018-11-07 NOTE — Progress Notes (Signed)
Subjective:    Date: 11/07/2018 10:56 PM   Patient ID: Leslie Ray is a 84 y.o. female.    Chief Complaint   Patient presents with    Hyperlipidemia    Hypertension    Gastroesophageal Reflux    Gout     HPI    Problem   Gerd (Gastroesophageal Reflux Disease)    Esomeprazole 20 mg , taking 2 tablets daily    Pt is asymptomatic on current medication  No known history of GI ulcer.     Hyperlipidemia    No results found for: CHOL  No results found for: HDL  No results found for: LDL  No results found for: TRIG   On atorvastatin 40 mg daily    Pt has hyperlipidemia and is compliant with anti-lipidemic therapy.  Pt denies side effects of anti-lipidemic therapy - specifically denies myalgia.         Essential Hypertension    Pt is compliant with current medication. No adverse effects reported to medication and compliance is reported to be good.   Pt denies CP/chest discomfort, palpitations, syncope or near syncope, orthopnea, PND or edema.    Home BP: not monitoring. Has a wrist cuff.     Amlodipine 10 mg   Toprol Xl 50 mg daily  Losartan 100 mg daily    Occasional shortness of breath when carrying heavy items.   Pt endorses a little dizziness. "I've always had it"       Gout    No results found for: URICACID    Flare has occurred on both feet. Has been ~ 5 years ago    On allopurinol 100 mg daily         Patient Active Problem List   Diagnosis    Gout    GERD (gastroesophageal reflux disease)    Hyperlipidemia    Essential hypertension     Patient Care Team:  Jules Husbands, DO as PCP - General (Family Medicine)  Lisette Grinder, MD as Consulting Physician (Critical Care Medicine)    Immunization History   Administered Date(s) Administered    Influenza quadrivalent (IM) 3 Yrs & greater 09/28/2018    Pneumococcal 23 valent 03/18/2002    Pneumococcal Conjugate 13-Valent 07/03/2015     Current Outpatient Medications   Medication Sig Dispense Refill    Albuterol Sulfate 108 (90 Base) MCG/ACT Aerosol  Powder, Breath Activtivatede Inhale 2 puffs into the lungs every 4 (four) hours as needed         allopurinol (ZYLOPRIM) 100 MG tablet Take 100 mg by mouth daily      amLODIPine (NORVASC) 10 MG tablet Take 10 mg by mouth daily      atorvastatin (LIPITOR) 40 MG tablet Take 1 tablet (40 mg total) by mouth daily 90 tablet 1    calcium-vitamin D (OSCAL 500/200 D-3) 500-200 MG-UNIT per tablet Take 1 tablet by mouth 2 (two) times daily 600mg  (1500mg ) - 200U tab         esomeprazole (NEXIUM) 20 MG capsule Take 20 mg by mouth every morning before breakfast      levothyroxine (SYNTHROID, LEVOTHROID) 75 MCG tablet Take 1 tablet (75 mcg total) by mouth daily BRAND NAME ONLY 90 tablet 0    Loratadine (CLARITIN PO) Take 5 mg by mouth daily         losartan (COZAAR) 100 MG tablet Take 100 mg by mouth daily      meclizine (ANTIVERT) 12.5 MG tablet Take 25 mg  by mouth every 8 (eight) hours as needed      metoprolol succinate XL (TOPROL-XL) 50 MG 24 hr tablet Take 1 tablet (50 mg total) by mouth daily 90 tablet 0    montelukast (SINGULAIR) 10 MG tablet Take 10 mg by mouth nightly      Omega-3 Fatty Acids (OMEGA-3 FISH OIL) 500 MG Cap Take 2 capsules by mouth 2 (two) times daily       No current facility-administered medications for this visit.      Allergies   Allergen Reactions    Penicillins        Past Medical History:   Diagnosis Date    Allergies     Anemia     Arthritis     Asthma     Bowel habit changes     Chest pain     Disorder of thyroid     Gastroesophageal reflux disease     Gout     Hyperglycemia     Hyperlipidemia     Hypertension     Laxative habit     Murmur     Vertigo      Past Surgical History:   Procedure Laterality Date    BLADDER REPAIR  1999    CATARACT EXTRACTION, BILATERAL  2002    HYSTERECTOMY  1999    THYROIDECTOMY, PARTIAL  1989    tumor removed - patient states was not cancer       Family History   Problem Relation Age of Onset    Emphysema Mother         smoker     Heart attack Father 85    Emphysema Brother        Social History     Socioeconomic History    Marital status: Widowed     Spouse name: Not on file    Number of children: Not on file    Years of education: Not on file    Highest education level: Not on file   Occupational History    Not on file   Social Needs    Financial resource strain: Not on file    Food insecurity:     Worry: Not on file     Inability: Not on file    Transportation needs:     Medical: Not on file     Non-medical: Not on file   Tobacco Use    Smoking status: Never Smoker    Smokeless tobacco: Never Used   Substance and Sexual Activity    Alcohol use: Yes    Drug use: Never    Sexual activity: Not on file   Lifestyle    Physical activity:     Days per week: Not on file     Minutes per session: Not on file    Stress: Not on file   Relationships    Social connections:     Talks on phone: Not on file     Gets together: Not on file     Attends religious service: Not on file     Active member of club or organization: Not on file     Attends meetings of clubs or organizations: Not on file     Relationship status: Not on file    Intimate partner violence:     Fear of current or ex partner: Not on file     Emotionally abused: Not on file     Physically abused: Not on file  Forced sexual activity: Not on file   Other Topics Concern    Not on file   Social History Narrative    Not on file     The following portions of the patient's history were reviewed and updated as appropriate: allergies, current medications, past family history, past medical history, past social history, past surgical history and problem list.    Review of Systems   Respiratory: Negative for cough and shortness of breath.    Cardiovascular: Negative for chest pain.   Neurological: Negative for light-headedness and headaches.        Objective:   BP 148/80 (BP Site: Left arm)    Pulse 71    Temp 97.9 F (36.6 C) (Oral)    Ht 1.562 m (5' 1.5")    Wt 59 kg (130 lb)     SpO2 96%    BMI 24.17 kg/m   Wt Readings from Last 3 Encounters:   11/07/18 59 kg (130 lb)   10/10/18 58.1 kg (128 lb)     Physical Exam   Constitutional: She appears well-developed and well-nourished. No distress.   Neck: Neck supple. Carotid bruit is not present. No thyroid mass and no thyromegaly present.   Cardiovascular: Normal rate, regular rhythm, S1 normal and S2 normal.   No murmur heard.  There is no peripheral edema noted in the lower extremities.      Pulmonary/Chest: Effort normal and breath sounds normal. She has no decreased breath sounds. She has no wheezes. She has no rhonchi. She has no rales.   Lymphadenopathy:     She has no cervical adenopathy.        Right: No supraclavicular adenopathy present.        Left: No supraclavicular adenopathy present.   Skin: She is not diaphoretic.   Psychiatric: Her speech is normal. Her mood appears not anxious. She does not exhibit a depressed mood.   Nursing note and vitals reviewed.       Assessment/Plan:       1. Hyperlipidemia, unspecified hyperlipidemia type  On moderate intensity statin for primary prevention, no history of CAD  - Comprehensive metabolic panel; Future  - TSH, Abn Reflex to Free T4, Serum; Future  - Lipid panel; Future    2. Essential hypertension  Acceptable BP control  Continue current med  Home BP monitoring  - CBC without differential; Future  - Comprehensive metabolic panel; Future    3. Gout of foot, unspecified cause, unspecified chronicity, unspecified laterality  No recent flare  Follow low purine diet  Continue allopurinol  - Uric acid; Future    4. Gastroesophageal reflux disease, esophagitis presence not specified  Controlled, reduce nexium to 20 mg daily  - Comprehensive metabolic panel; Future    5. Screening for osteoporosis  - Dxa Bone Density Axial Skeleton    Pt saw pulmonary Dr. Illene Bolus yesterday. Spirometry is normal. Plans for PFT in 1-2 months.   Will continue singulair for now   May consider maintenance inhaler  pending PFT    Return in about 3 months (around 02/06/2019).    Jules Husbands, DO

## 2018-11-14 ENCOUNTER — Encounter (INDEPENDENT_AMBULATORY_CARE_PROVIDER_SITE_OTHER): Payer: Self-pay | Admitting: Family Medicine

## 2018-11-15 ENCOUNTER — Other Ambulatory Visit (INDEPENDENT_AMBULATORY_CARE_PROVIDER_SITE_OTHER): Payer: Self-pay | Admitting: Family Medicine

## 2018-11-15 MED ORDER — MONTELUKAST SODIUM 10 MG PO TABS
10.0000 mg | ORAL_TABLET | Freq: Every evening | ORAL | 0 refills | Status: DC
Start: 2018-11-15 — End: 2019-02-08

## 2018-11-15 NOTE — Telephone Encounter (Signed)
Name, strength, directions of requested refill(s):  montelukast (SINGULAIR) 10 MG tablet    Pharmacy to send refill to or patient to pick up rx from office (mark requested pharmacy in BOLD):      [facility] [address] ([address]), [place] - [address]  [address]  [place] ([place]) [place] [address]  Phone: [phone] Fax: [phone]        Please mark "X" next to the preferred call back number:    Mobile:   Telephone Information:   Mobile [phone]          X   Home: @HOMEPHONE @    Work: @WORKPHONE @          Next visit: 02/12/2019     Pt has 3 tablets left

## 2018-11-30 ENCOUNTER — Other Ambulatory Visit (INDEPENDENT_AMBULATORY_CARE_PROVIDER_SITE_OTHER): Payer: Self-pay | Admitting: Family Medicine

## 2018-11-30 MED ORDER — LOSARTAN POTASSIUM 100 MG PO TABS
100.0000 mg | ORAL_TABLET | Freq: Every day | ORAL | 0 refills | Status: DC
Start: 2018-11-30 — End: 2019-03-09

## 2018-11-30 NOTE — Telephone Encounter (Signed)
Name, strength, directions of requested refill(s):    losartan (COZAAR) 100 MG tablet    Pharmacy to send refill to or patient to pick up rx from office (mark requested pharmacy in BOLD):      [facility] [address] ([address]), [place] - [address]  [address]  [place] ([place]) [place] [address]  Phone: [phone] Fax: [phone]        Please mark "X" next to the preferred call back number:    Mobile:   Telephone Information:   Mobile [phone]        X   Home:     Work:           Next visit: 02/12/2019

## 2018-12-27 ENCOUNTER — Encounter (INDEPENDENT_AMBULATORY_CARE_PROVIDER_SITE_OTHER): Payer: Self-pay | Admitting: Family Medicine

## 2019-01-04 ENCOUNTER — Other Ambulatory Visit (INDEPENDENT_AMBULATORY_CARE_PROVIDER_SITE_OTHER): Payer: Self-pay | Admitting: Family Medicine

## 2019-01-04 MED ORDER — AMLODIPINE BESYLATE 10 MG PO TABS
10.0000 mg | ORAL_TABLET | Freq: Every day | ORAL | 1 refills | Status: DC
Start: 2019-01-04 — End: 2019-07-24

## 2019-01-04 NOTE — Telephone Encounter (Signed)
Name, strength, directions of requested refill(s):  amLODIPine (NORVASC) 10 MG tablet  Pt number [phone]  Pharmacy to send refill to or patient to pick up rx from office (mark requested pharmacy in BOLD):      [facility] [address] (S), [place] - [address]  [address]  [place] ([place]) [place] [address]  Phone: [phone] Fax: [phone]        Please mark "X" next to the preferred call back number:    Mobile:   Telephone Information:   Mobile [phone]       Home: @HOMEPHONE @    Work: @WORKPHONE @          Next visit: 02/12/2019

## 2019-01-29 ENCOUNTER — Other Ambulatory Visit: Payer: Self-pay | Admitting: Family Medicine

## 2019-01-29 MED ORDER — LEVOTHYROXINE SODIUM 75 MCG PO TABS
75.0000 ug | ORAL_TABLET | Freq: Every day | ORAL | 0 refills | Status: DC
Start: 2019-01-29 — End: 2019-05-03

## 2019-01-29 NOTE — Telephone Encounter (Signed)
Name, strength, directions of requested refill(s):    -levothyroxine (SYNTHROID, LEVOTHROID) 75 MCG tablet      Pharmacy to send refill to or patient to pick up rx from office (mark requested pharmacy in BOLD):      [facility] [address] ([address]), [place] - [address]  [address]  [place] ([place]) [place] [address]  Phone: [phone] Fax: [phone]        Please mark "X" next to the preferred call back number:    Mobile:   Telephone Information:   Mobile [phone]       Home: @HOMEPHONE @    Work: @WORKPHONE @      Patient requesting refill.    Next visit: Visit date not found

## 2019-02-08 ENCOUNTER — Other Ambulatory Visit (INDEPENDENT_AMBULATORY_CARE_PROVIDER_SITE_OTHER): Payer: Self-pay | Admitting: Family Medicine

## 2019-02-08 MED ORDER — MONTELUKAST SODIUM 10 MG PO TABS
10.0000 mg | ORAL_TABLET | Freq: Every evening | ORAL | 1 refills | Status: DC
Start: 2019-02-08 — End: 2019-08-14

## 2019-02-08 NOTE — Telephone Encounter (Signed)
Rx refill for montelukast (SINGULAIR) 10 MG tablet , metoprolol 50 mg     Preferred Pharmacies        [facility] [address] ([address]), [place] - [phone] [phone] (Phone)  [phone] (Fax)      Pt info  [phone] (M)  [phone] (H)    Thank you

## 2019-02-11 DIAGNOSIS — J4520 Mild intermittent asthma, uncomplicated: Secondary | ICD-10-CM | POA: Insufficient documentation

## 2019-02-11 DIAGNOSIS — J309 Allergic rhinitis, unspecified: Secondary | ICD-10-CM | POA: Insufficient documentation

## 2019-02-11 NOTE — Progress Notes (Signed)
Subjective:    Date: 02/12/2019 4:06 PM   Patient ID: Leslie Ray is a 84 y.o. female.    Chief Complaint   Patient presents with    Diarrhea     for 3-4 days    Nausea    Follow-up     fasting    Asthma    Gastroesophageal Reflux    Gout    Hyperlipidemia     HPI     Problem   Irregular Bowel Habits    Pt c/o constipation that occurs about 2-3 times a year.    Currently having difficulty.  She did complete a course of azithromycin a few weeks ago for bronchitis which has resolved. She took an OTC laxative (pt does not recall name) 4 days ago and reports which caused her to have loose bowel movements.  It is slowing down, however having some urgency yesterday with only a small soft bowel movement. Denies abdominal pain, fever/chills, nausea, vomiting, diarrhea, rectal bleeding, unexplained weight change     Allergic Rhinitis    Albuterol inh prn  symbicort  singulair  flonase  Allegra     Pulm: Dr. Illene Bolus      Mild Intermittent Asthma    Albuterol inh prn  symbicort  singulair  flonase  Allegra     Pulm: Dr. Illene Bolus    Pt is not taking symbicort regularly.  Last used albuterol several weeks ago.  Pt states breathing is improved, back to baseline.        Gerd (Gastroesophageal Reflux Disease)    Esomeprazole 20 mg , taking 1 tablet daily    Pt is asymptomatic on current medication  No known history of GI ulcer.     No results found for: MG     No results found for: B12       Hyperlipidemia    No results found for: CHOL  No results found for: HDL  No results found for: LDL  No results found for: TRIG   On atorvastatin 40 mg daily    Pt has hyperlipidemia and is compliant with anti-lipidemic therapy.  Pt denies side effects of anti-lipidemic therapy - specifically denies myalgia.          Essential Hypertension    Amlodipine 10 mg   Toprol Xl 50 mg daily  Losartan 100 mg daily    Occasional shortness of breath when carrying heavy items.   Pt endorses a little dizziness. "I've always had it"    Pt is  compliant with current medication. No adverse effects reported to medication and compliance is reported to be good.   Pt denies CP/chest discomfort, palpitations, syncope or near syncope, orthopnea, PND or edema.    Home BP: not monitoring. Has a wrist cuff.     Renal Function Trend:  No results found for: CREAT    No results found for: EGFR    No results found for: WBC, HGB, HCT, MCV, PLT        Gout    No results found for: URICACID     Flare has occurred on both feet. Has been ~ 5 years ago    On allopurinol 100 mg daily         Patient Active Problem List   Diagnosis    Gout    GERD (gastroesophageal reflux disease)    Hyperlipidemia    Essential hypertension    Allergic rhinitis    Mild intermittent asthma    Irregular bowel habits  Patient Care Team:  Jules Husbands, DO as PCP - General (Family Medicine)  Lisette Grinder, MD as Consulting Physician (Critical Care Medicine)    Immunization History   Administered Date(s) Administered    Influenza quadrivalent (IM) 3 Yrs & greater 09/28/2018    Pneumococcal 23 valent 03/18/2002    Pneumococcal Conjugate 13-Valent 07/03/2015     Current Outpatient Medications   Medication Sig Dispense Refill    Albuterol Sulfate 108 (90 Base) MCG/ACT Aerosol Powder, Breath Activtivatede Inhale 2 puffs into the lungs every 4 (four) hours as needed         allopurinol (ZYLOPRIM) 100 MG tablet Take 100 mg by mouth daily      amLODIPine (NORVASC) 10 MG tablet Take 1 tablet (10 mg total) by mouth daily 90 tablet 1    atorvastatin (LIPITOR) 40 MG tablet Take 1 tablet (40 mg total) by mouth daily 90 tablet 1    calcium-vitamin D (OSCAL 500/200 D-3) 500-200 MG-UNIT per tablet Take 1 tablet by mouth 2 (two) times daily 600mg  (1500mg ) - 200U tab         esomeprazole (NEXIUM) 20 MG capsule Take 20 mg by mouth every morning before breakfast      levothyroxine (SYNTHROID, LEVOTHROID) 75 MCG tablet Take 1 tablet (75 mcg total) by mouth daily BRAND NAME ONLY 90 tablet 0     Loratadine (CLARITIN PO) Take 5 mg by mouth daily         losartan (COZAAR) 100 MG tablet Take 1 tablet (100 mg total) by mouth daily 90 tablet 0    meclizine (ANTIVERT) 12.5 MG tablet Take 25 mg by mouth every 8 (eight) hours as needed      montelukast (SINGULAIR) 10 MG tablet Take 1 tablet (10 mg total) by mouth nightly 90 tablet 1    Omega-3 Fatty Acids (OMEGA-3 FISH OIL) 500 MG Cap Take 2 capsules by mouth 2 (two) times daily       No current facility-administered medications for this visit.        Allergies   Allergen Reactions    Penicillins        Past Medical History:   Diagnosis Date    Allergies     Anemia     Arthritis     Asthma     Bowel habit changes     Chest pain     Disorder of thyroid     Gastroesophageal reflux disease     Gout     Hyperglycemia     Hyperlipidemia     Hypertension     Laxative habit     Murmur     Vertigo      Past Surgical History:   Procedure Laterality Date    BLADDER REPAIR  1999    CATARACT EXTRACTION, BILATERAL  2002    HYSTERECTOMY  1999    THYROIDECTOMY, PARTIAL  1989    tumor removed - patient states was not cancer       Family History   Problem Relation Age of Onset    Emphysema Mother         smoker    Heart attack Father 41    Emphysema Brother        Social History     Socioeconomic History    Marital status: Widowed     Spouse name: Not on file    Number of children: Not on file    Years of education: Not on file  Highest education level: Not on file   Occupational History    Not on file   Social Needs    Financial resource strain: Not on file    Food insecurity:     Worry: Not on file     Inability: Not on file    Transportation needs:     Medical: Not on file     Non-medical: Not on file   Tobacco Use    Smoking status: Never Smoker    Smokeless tobacco: Never Used   Substance and Sexual Activity    Alcohol use: Yes    Drug use: Never    Sexual activity: Not on file   Lifestyle    Physical activity:     Days per week: Not  on file     Minutes per session: Not on file    Stress: Not on file   Relationships    Social connections:     Talks on phone: Not on file     Gets together: Not on file     Attends religious service: Not on file     Active member of club or organization: Not on file     Attends meetings of clubs or organizations: Not on file     Relationship status: Not on file    Intimate partner violence:     Fear of current or ex partner: Not on file     Emotionally abused: Not on file     Physically abused: Not on file     Forced sexual activity: Not on file   Other Topics Concern    Not on file   Social History Narrative    Not on file     The following portions of the patient's history were reviewed and updated as appropriate: allergies, current medications, past family history, past medical history, past social history, past surgical history and problem list.    Review of Systems   Constitutional: Negative for chills and fever.   Respiratory: Negative for cough and shortness of breath.    Cardiovascular: Negative for chest pain.   Gastrointestinal: Positive for constipation.        Objective:   BP 123/71 (BP Site: Left arm)    Pulse 91    Temp 99.3 F (37.4 C) (Oral)    Ht 1.562 m (5' 1.5")    Wt 58.5 kg (129 lb)    BMI 23.98 kg/m   Wt Readings from Last 3 Encounters:   02/12/19 58.5 kg (129 lb)   11/07/18 59 kg (130 lb)   10/10/18 58.1 kg (128 lb)     Physical Exam  Vitals signs and nursing note reviewed.   Constitutional:       General: She is not in acute distress.  Neck:      Musculoskeletal: Neck supple.      Thyroid: No thyroid mass or thyromegaly.      Vascular: No carotid bruit.   Cardiovascular:      Rate and Rhythm: Normal rate and regular rhythm.      Heart sounds: S1 normal and S2 normal. No murmur.      Comments:     Pulmonary:      Effort: Pulmonary effort is normal.      Breath sounds: Normal breath sounds. No decreased breath sounds, wheezing, rhonchi or rales.   Abdominal:      General: Bowel sounds  are normal. There is no distension.      Palpations: Abdomen is  soft.      Tenderness: There is no abdominal tenderness.   Musculoskeletal:      Right lower leg: No edema.      Left lower leg: No edema.   Lymphadenopathy:      Cervical: No cervical adenopathy.      Upper Body:      Right upper body: No supraclavicular adenopathy.      Left upper body: No supraclavicular adenopathy.            Assessment/Plan:       1. Gout of foot, unspecified cause, unspecified chronicity, unspecified laterality  Encourage follow low purine diet  Continue current med  - Uric acid    2. Gastroesophageal reflux disease, esophagitis presence not specified  Controlled on current PPI, continue  - Magnesium  - Vitamin B12  - Comprehensive metabolic panel      3. Hyperlipidemia, unspecified hyperlipidemia type  Check lipids  Continue current med  - Comprehensive metabolic panel  - TSH, Abn Reflex to Free T4, Serum  - Lipid panel    4. Essential hypertension  Controlled, continue current med  Continued to counsel on regular exercise, weight control, optimizing heart healthy diet  - CBC without differential  - Comprehensive metabolic panel    5. Allergic rhinitis, unspecified seasonality, unspecified trigger  Continue current med    6. Mild intermittent asthma without complication   Moderate persistent asthma. The patient is not currently having an exacerbation. In general, the patient's disease is well controlled.     Review treatment goals of symptom prevention, minimizing limitation in activity, prevention of exacerbations and use of ER/inpatient care and minimization of adverse effects of treatment.  Discussed distinction between quick-relief and controlled medications.  Discussed medication dosage, use, side effects, and goals of treatment in detail.    Pt does not feel she needs symbicort at this time, advised to continue and contact her pulmonologist for further advise.     7. Irregular bowel habits  Recommend fiber supplement to  help increase stool bulk and relieve constipation. Ensure adequate hydration  Take fiber supplement such as benefiber and metamucil daily    Further recommendations pending test results.     Recommended patient to get shingles vaccine at pharmacy    Return in about 3 months (around 05/14/2019).    Jules Husbands, DO

## 2019-02-12 ENCOUNTER — Ambulatory Visit (INDEPENDENT_AMBULATORY_CARE_PROVIDER_SITE_OTHER): Payer: Medicare Other | Admitting: Family Medicine

## 2019-02-12 ENCOUNTER — Encounter (INDEPENDENT_AMBULATORY_CARE_PROVIDER_SITE_OTHER): Payer: Self-pay | Admitting: Family Medicine

## 2019-02-12 VITALS — BP 123/71 | HR 91 | Temp 99.3°F | Ht 61.5 in | Wt 129.0 lb

## 2019-02-12 DIAGNOSIS — R198 Other specified symptoms and signs involving the digestive system and abdomen: Secondary | ICD-10-CM | POA: Insufficient documentation

## 2019-02-12 DIAGNOSIS — K219 Gastro-esophageal reflux disease without esophagitis: Secondary | ICD-10-CM

## 2019-02-12 DIAGNOSIS — I10 Essential (primary) hypertension: Secondary | ICD-10-CM

## 2019-02-12 DIAGNOSIS — E785 Hyperlipidemia, unspecified: Secondary | ICD-10-CM

## 2019-02-12 DIAGNOSIS — J309 Allergic rhinitis, unspecified: Secondary | ICD-10-CM

## 2019-02-12 DIAGNOSIS — J4520 Mild intermittent asthma, uncomplicated: Secondary | ICD-10-CM

## 2019-02-12 DIAGNOSIS — M109 Gout, unspecified: Secondary | ICD-10-CM

## 2019-02-12 LAB — CBC
Absolute NRBC: 0 10*3/uL (ref 0.00–0.00)
Hematocrit: 36 % (ref 34.7–43.7)
Hgb: 11.3 g/dL — ABNORMAL LOW (ref 11.4–14.8)
MCH: 29.4 pg (ref 25.1–33.5)
MCHC: 31.4 g/dL — ABNORMAL LOW (ref 31.5–35.8)
MCV: 93.8 fL (ref 78.0–96.0)
MPV: 9.9 fL (ref 8.9–12.5)
Nucleated RBC: 0 /100 WBC (ref 0.0–0.0)
Platelets: 309 10*3/uL (ref 142–346)
RBC: 3.84 10*6/uL — ABNORMAL LOW (ref 3.90–5.10)
RDW: 14 % (ref 11–15)
WBC: 11.72 10*3/uL — ABNORMAL HIGH (ref 3.10–9.50)

## 2019-02-12 LAB — THYROID STIMULATING HORMONE (TSH), REFLEX ON ABNORMAL TO FREE T4, SERUM: TSH, Abn Reflex to Free T4, Serum: 0.55 u[IU]/mL (ref 0.35–4.94)

## 2019-02-12 LAB — COMPREHENSIVE METABOLIC PANEL
ALT: 12 U/L (ref 0–55)
AST (SGOT): 16 U/L (ref 5–34)
Albumin/Globulin Ratio: 1.3 (ref 0.9–2.2)
Albumin: 3.9 g/dL (ref 3.5–5.0)
Alkaline Phosphatase: 79 U/L (ref 37–106)
BUN: 24 mg/dL — ABNORMAL HIGH (ref 7.0–19.0)
Bilirubin, Total: 0.5 mg/dL (ref 0.2–1.2)
CO2: 21 mEq/L (ref 21–29)
Calcium: 9.1 mg/dL (ref 7.9–10.2)
Chloride: 104 mEq/L (ref 100–111)
Creatinine: 1.3 mg/dL (ref 0.4–1.5)
Globulin: 3 g/dL (ref 2.0–3.7)
Glucose: 91 mg/dL (ref 70–100)
Potassium: 4.2 mEq/L (ref 3.5–5.1)
Protein, Total: 6.9 g/dL (ref 6.0–8.3)
Sodium: 137 mEq/L (ref 136–145)

## 2019-02-12 LAB — HEMOLYSIS INDEX: Hemolysis Index: 2 (ref 0–18)

## 2019-02-12 LAB — LIPID PANEL
Cholesterol / HDL Ratio: 3.2
Cholesterol: 128 mg/dL (ref 0–199)
HDL: 40 mg/dL (ref 40–9999)
LDL Calculated: 71 mg/dL (ref 0–99)
Triglycerides: 85 mg/dL (ref 34–149)
VLDL Calculated: 17 mg/dL (ref 10–40)

## 2019-02-12 LAB — MAGNESIUM: Magnesium: 1.9 mg/dL (ref 1.6–2.6)

## 2019-02-12 LAB — VITAMIN B12: Vitamin B-12: 788 pg/mL (ref 211–911)

## 2019-02-12 LAB — GFR: EGFR: 39.2

## 2019-02-12 LAB — URIC ACID: Uric acid: 6.1 mg/dL — ABNORMAL HIGH (ref 2.6–6.0)

## 2019-02-12 NOTE — Progress Notes (Signed)
Have you seen any specialists/other providers since your last visit with [place]?    Yes, Pulmonologist     Arm preference verified?   Yes    The patient is due for multiple

## 2019-02-14 ENCOUNTER — Other Ambulatory Visit (INDEPENDENT_AMBULATORY_CARE_PROVIDER_SITE_OTHER): Payer: Self-pay | Admitting: Family Medicine

## 2019-02-14 DIAGNOSIS — N289 Disorder of kidney and ureter, unspecified: Secondary | ICD-10-CM

## 2019-02-14 DIAGNOSIS — D729 Disorder of white blood cells, unspecified: Secondary | ICD-10-CM

## 2019-02-16 ENCOUNTER — Other Ambulatory Visit (INDEPENDENT_AMBULATORY_CARE_PROVIDER_SITE_OTHER): Payer: Self-pay | Admitting: Family Medicine

## 2019-02-16 NOTE — Telephone Encounter (Signed)
Name, strength, directions of requested refill(s):    metoprolol succinate XL (TOPROL-XL) 50 MG 24 hr tablet (Order [id number])     **Pt is completely out of the above medication; please refill as soon as possible**     Pharmacy to send refill to or patient to pick up rx from office (mark requested pharmacy in BOLD):      [facility] [address] (S), [place] - [address]  [address] ([place]) [place] [address]  Phone: [phone] Fax: [phone]    [facility] - [place], [place] - [address] [facility]  [address]  [place] [address]  Phone: [phone] Fax: [phone]        Please mark "X" next to the preferred call back number:    Mobile:   Telephone Information:   Mobile [phone]    X       Home: @HOMEPHONE @    Work: @WORKPHONE @          Next visit: 05/14/2019

## 2019-02-19 MED ORDER — METOPROLOL SUCCINATE ER 50 MG PO TB24
50.00 mg | ORAL_TABLET | Freq: Every day | ORAL | 1 refills | Status: DC
Start: 2019-02-19 — End: 2019-08-14

## 2019-03-09 ENCOUNTER — Other Ambulatory Visit (INDEPENDENT_AMBULATORY_CARE_PROVIDER_SITE_OTHER): Payer: Self-pay | Admitting: Family Medicine

## 2019-03-09 MED ORDER — LOSARTAN POTASSIUM 100 MG PO TABS
100.0000 mg | ORAL_TABLET | Freq: Every day | ORAL | 1 refills | Status: DC
Start: 2019-03-09 — End: 2019-08-14

## 2019-03-09 NOTE — Telephone Encounter (Signed)
Name, strength, directions of requested refill(s):  losartan (COZAAR) 100 MG tablet  Pt number [id number]  Pharmacy to send refill to or patient to pick up rx from office (mark requested pharmacy in BOLD):      [facility] [address] ([address]), [place] - [address]  [address]  [place] ([place]) [place] [address]  Phone: [phone] Fax: [phone]    [facility] - [place], [place] - [address] [facility]  [address]  [place] [address]  Phone: [phone] Fax: [phone]        Please mark "X" next to the preferred call back number:    Mobile:   Telephone Information:   Mobile [phone]       Home: @HOMEPHONE @    Work: @WORKPHONE @          Next visit: 05/14/2019

## 2019-03-22 ENCOUNTER — Telehealth (INDEPENDENT_AMBULATORY_CARE_PROVIDER_SITE_OTHER): Payer: Self-pay

## 2019-03-22 NOTE — Telephone Encounter (Signed)
03/22/19 12:43 PM   Note      Pt called stating her bp 191/91 want to speak with a nurse         -    Outgoing call made to patient's daughter Laryah Neuser, stated that she has taken her mother to [facility] ED regarding concern

## 2019-03-30 ENCOUNTER — Encounter (INDEPENDENT_AMBULATORY_CARE_PROVIDER_SITE_OTHER): Payer: Self-pay | Admitting: Family Medicine

## 2019-03-30 ENCOUNTER — Ambulatory Visit (INDEPENDENT_AMBULATORY_CARE_PROVIDER_SITE_OTHER): Payer: Medicare Other | Admitting: Family Medicine

## 2019-03-30 VITALS — BP 131/68 | HR 84 | Temp 98.2°F | Ht 61.5 in | Wt 131.0 lb

## 2019-03-30 DIAGNOSIS — N3001 Acute cystitis with hematuria: Secondary | ICD-10-CM

## 2019-03-30 DIAGNOSIS — I10 Essential (primary) hypertension: Secondary | ICD-10-CM

## 2019-03-30 DIAGNOSIS — E785 Hyperlipidemia, unspecified: Secondary | ICD-10-CM

## 2019-03-30 DIAGNOSIS — G44209 Tension-type headache, unspecified, not intractable: Secondary | ICD-10-CM

## 2019-03-30 DIAGNOSIS — K219 Gastro-esophageal reflux disease without esophagitis: Secondary | ICD-10-CM

## 2019-03-30 DIAGNOSIS — M109 Gout, unspecified: Secondary | ICD-10-CM

## 2019-03-30 NOTE — Progress Notes (Signed)
Have you seen any specialists/other providers since your last visit with [place]?    Yes, [facility]    Arm preference verified?   Yes    The patient is due for Multiple

## 2019-03-30 NOTE — Progress Notes (Signed)
Subjective:    Date: 03/30/2019 8:09 AM   Patient ID: Leslie Ray is a 84 y.o. female.    Chief Complaint   Patient presents with    Post ED Visit     ED - 03/22/2019     HPI  Headache and BP was elevated and felt funny on the left side of face so she went to the ED. She is asymptomatic now, no recurrence.  She denies headache, vision change, chest pain, shortness of breath, lightheadedness, or any discomfort/problem with urination. Pt completed course of macrobid which ucx shows is sensitive.  CBC, CMP, PT, Trop, BNP are normal  Urinalysis shows 10WBC, 2RBC, urine cx e.coli  CXR - mild cardiomegaly and emphysematous changes, left base scarring.  CT head - NAD  EKG  Normal sinus rhythm.      Problem   Gerd (Gastroesophageal Reflux Disease)    Esomeprazole 20 mg , taking 1 tablet daily    Pt is asymptomatic on current medication  No known history of GI ulcer.     Magnesium   Date Value Ref Range Status   02/12/2019 1.9 1.6 - 2.6 mg/dL Final     Vitamin V-40   Date Value Ref Range Status   02/12/2019 788 211 - 911 pg/mL Final          Hyperlipidemia     On atorvastatin 40 mg daily    Pt has hyperlipidemia and is compliant with anti-lipidemic therapy.  Pt denies side effects of anti-lipidemic therapy - specifically denies myalgia.       Lab Results   Component Value Date    CHOL 128 02/12/2019     Lab Results   Component Value Date    HDL 40 02/12/2019     Lab Results   Component Value Date    LDL 71 02/12/2019     Lab Results   Component Value Date    TRIG 85 02/12/2019          Essential Hypertension    Amlodipine 10 mg   Toprol Xl 50 mg daily  Losartan 100 mg daily    Occasional shortness of breath when carrying heavy items.   Pt endorses a little dizziness. "I've always had it"    Pt is compliant with current medication. No adverse effects reported to medication and compliance is reported to be good.   Pt denies CP/chest discomfort, palpitations, syncope or near syncope, orthopnea, PND or edema.    Home BP:  not monitoring. Has a wrist cuff.     Renal Function Trend:  Lab Results   Component Value Date    CREAT 1.3 02/12/2019       Lab Results   Component Value Date    EGFR 39.2 02/12/2019       Lab Results   Component Value Date    WBC 11.72 (H) 02/12/2019    HGB 11.3 (L) 02/12/2019    HCT 36.0 02/12/2019    MCV 93.8 02/12/2019    PLT 309 02/12/2019           Gout       Flare has occurred on both feet. Has been ~ 5 years ago    On allopurinol 100 mg daily  Lab Results   Component Value Date    URICACID 6.1 (H) 02/12/2019            Lab Results   Component Value Date    TSH 0.55 02/12/2019  Patient Active Problem List   Diagnosis    Gout    GERD (gastroesophageal reflux disease)    Hyperlipidemia    Essential hypertension    Allergic rhinitis    Mild intermittent asthma    Irregular bowel habits     Patient Care Team:  Jules Husbands, DO as PCP - General (Family Medicine)  Lisette Grinder, MD as Consulting Physician (Critical Care Medicine)    Immunization History   Administered Date(s) Administered    Influenza quadrivalent (IM) 3 Yrs & greater 09/28/2018    Pneumococcal 23 valent 03/18/2002    Pneumococcal Conjugate 13-Valent 07/03/2015     Current Outpatient Medications   Medication Sig Dispense Refill    Albuterol Sulfate 108 (90 Base) MCG/ACT Aerosol Powder, Breath Activtivatede Inhale 2 puffs into the lungs every 4 (four) hours as needed         allopurinol (ZYLOPRIM) 100 MG tablet Take 100 mg by mouth daily      amLODIPine (NORVASC) 10 MG tablet Take 1 tablet (10 mg total) by mouth daily 90 tablet 1    atorvastatin (LIPITOR) 40 MG tablet Take 1 tablet (40 mg total) by mouth daily 90 tablet 1    budesonide-formoterol (SYMBICORT) 160-4.5 MCG/ACT inhaler daily as needed      calcium-vitamin D (OSCAL 500/200 D-3) 500-200 MG-UNIT per tablet Take 1 tablet by mouth 2 (two) times daily 600mg  (1500mg ) - 200U tab         esomeprazole (NEXIUM) 20 MG capsule Take 20 mg by mouth every morning before  breakfast      levothyroxine (SYNTHROID, LEVOTHROID) 75 MCG tablet Take 1 tablet (75 mcg total) by mouth daily BRAND NAME ONLY 90 tablet 0    Loratadine (CLARITIN PO) Take 5 mg by mouth daily         losartan (COZAAR) 100 MG tablet Take 1 tablet (100 mg total) by mouth daily 90 tablet 1    meclizine (ANTIVERT) 12.5 MG tablet Take 25 mg by mouth every 8 (eight) hours as needed      metoprolol succinate XL (TOPROL-XL) 50 MG 24 hr tablet Take 1 tablet (50 mg total) by mouth daily 90 tablet 1    montelukast (SINGULAIR) 10 MG tablet Take 1 tablet (10 mg total) by mouth nightly 90 tablet 1    Omega-3 Fatty Acids (OMEGA-3 FISH OIL) 500 MG Cap Take 2 capsules by mouth 2 (two) times daily       No current facility-administered medications for this visit.      Allergies   Allergen Reactions    Penicillins        Past Medical History:   Diagnosis Date    Allergies     Anemia     Arthritis     Asthma     Bowel habit changes     Chest pain     Disorder of thyroid     Gastroesophageal reflux disease     Gout     Hyperglycemia     Hyperlipidemia     Hypertension     Laxative habit     Murmur     Vertigo      Past Surgical History:   Procedure Laterality Date    BLADDER REPAIR  1999    CATARACT EXTRACTION, BILATERAL  2002    HYSTERECTOMY  1999    THYROIDECTOMY, PARTIAL  1989    tumor removed - patient states was not cancer       Family History  Problem Relation Age of Onset    Emphysema Mother         smoker    Heart attack Father 8    Emphysema Brother        Social History     Socioeconomic History    Marital status: Widowed     Spouse name: Not on file    Number of children: Not on file    Years of education: Not on file    Highest education level: Not on file   Occupational History    Not on file   Social Needs    Financial resource strain: Not on file    Food insecurity     Worry: Not on file     Inability: Not on file    Transportation needs     Medical: Not on file     Non-medical: Not  on file   Tobacco Use    Smoking status: Never Smoker    Smokeless tobacco: Never Used   Substance and Sexual Activity    Alcohol use: Yes    Drug use: Never    Sexual activity: Not on file   Lifestyle    Physical activity     Days per week: Not on file     Minutes per session: Not on file    Stress: Not on file   Relationships    Social connections     Talks on phone: Not on file     Gets together: Not on file     Attends religious service: Not on file     Active member of club or organization: Not on file     Attends meetings of clubs or organizations: Not on file     Relationship status: Not on file    Intimate partner violence     Fear of current or ex partner: Not on file     Emotionally abused: Not on file     Physically abused: Not on file     Forced sexual activity: Not on file   Other Topics Concern    Not on file   Social History Narrative    Not on file     The following portions of the patient's history were reviewed and updated as appropriate: allergies, current medications, past family history, past medical history, past social history, past surgical history and problem list.    Review of Systems   Respiratory: Negative for shortness of breath.    Cardiovascular: Negative for chest pain.   Neurological: Negative for light-headedness and headaches.        Objective:   BP 131/68 (BP Site: Left arm)    Pulse 84    Temp 98.2 F (36.8 C) (Temporal)    Ht 1.562 m (5' 1.5")    Wt 59.4 kg (131 lb)    BMI 24.35 kg/m   Wt Readings from Last 3 Encounters:   03/30/19 59.4 kg (131 lb)   02/12/19 58.5 kg (129 lb)   11/07/18 59 kg (130 lb)     Physical Exam  Vitals signs and nursing note reviewed.   Constitutional:       General: She is not in acute distress.  Neck:      Musculoskeletal: Neck supple.      Thyroid: No thyroid mass or thyromegaly.      Vascular: No carotid bruit.   Cardiovascular:      Rate and Rhythm: Normal rate and regular rhythm.  Heart sounds: S1 normal and S2 normal. No murmur.       Comments:     Pulmonary:      Effort: Pulmonary effort is normal.      Breath sounds: Normal breath sounds. No decreased breath sounds, wheezing, rhonchi or rales.   Musculoskeletal:      Right lower leg: No edema.      Left lower leg: No edema.   Lymphadenopathy:      Cervical: No cervical adenopathy.      Upper Body:      Right upper body: No supraclavicular adenopathy.      Left upper body: No supraclavicular adenopathy.   Neurological:      General: No focal deficit present.      Mental Status: She is alert.      Gait: Gait normal.   Psychiatric:         Attention and Perception: Attention normal.         Mood and Affect: Mood normal.         Speech: Speech normal.         Behavior: Behavior normal.          Assessment/Plan:       1. Acute non intractable tension-type headache  Resolved  -Patient was counseled to follow the SEEDS for success in headache management, including Sleep hygiene, Exercising regularly, Eating healthy and regular meals, Drinking water, keeping a headache Diary, and Stress reduction.    2. Acute cystitis with hematuria  Resolved  Encouraged keep hydrated  Adequate water intake of 60-80oz/day  Regular voiding, avoid holding urine    3. Hyperlipidemia, unspecified hyperlipidemia type    4. Gout of foot, unspecified cause, unspecified chronicity, unspecified laterality    5. Gastroesophageal reflux disease, esophagitis presence not specified    6. Essential hypertension  - Basic Metabolic Panel; Future    Keep currently scheduled followup visit in July     Jules Husbands, DO

## 2019-05-03 ENCOUNTER — Other Ambulatory Visit (INDEPENDENT_AMBULATORY_CARE_PROVIDER_SITE_OTHER): Payer: Self-pay | Admitting: Family Medicine

## 2019-05-03 MED ORDER — LEVOTHYROXINE SODIUM 75 MCG PO TABS
75.0000 ug | ORAL_TABLET | Freq: Every day | ORAL | 1 refills | Status: DC
Start: 2019-05-03 — End: 2019-10-31

## 2019-05-03 NOTE — Telephone Encounter (Signed)
Pt called requesting refill of levothyroxine (SYNTHROID, LEVOTHROID) 75 MCG tablet

## 2019-05-14 ENCOUNTER — Encounter (INDEPENDENT_AMBULATORY_CARE_PROVIDER_SITE_OTHER): Payer: Self-pay | Admitting: Family Medicine

## 2019-05-14 ENCOUNTER — Ambulatory Visit (INDEPENDENT_AMBULATORY_CARE_PROVIDER_SITE_OTHER): Payer: Medicare Other | Admitting: Family Medicine

## 2019-05-14 VITALS — BP 124/75 | HR 66 | Temp 98.3°F | Ht 61.5 in | Wt 129.8 lb

## 2019-05-14 DIAGNOSIS — Z23 Encounter for immunization: Secondary | ICD-10-CM

## 2019-05-14 DIAGNOSIS — M109 Gout, unspecified: Secondary | ICD-10-CM

## 2019-05-14 DIAGNOSIS — E785 Hyperlipidemia, unspecified: Secondary | ICD-10-CM

## 2019-05-14 DIAGNOSIS — E039 Hypothyroidism, unspecified: Secondary | ICD-10-CM | POA: Insufficient documentation

## 2019-05-14 DIAGNOSIS — J4520 Mild intermittent asthma, uncomplicated: Secondary | ICD-10-CM

## 2019-05-14 DIAGNOSIS — J309 Allergic rhinitis, unspecified: Secondary | ICD-10-CM

## 2019-05-14 DIAGNOSIS — I10 Essential (primary) hypertension: Secondary | ICD-10-CM

## 2019-05-14 DIAGNOSIS — K219 Gastro-esophageal reflux disease without esophagitis: Secondary | ICD-10-CM

## 2019-05-14 NOTE — Patient Instructions (Signed)
Please check with your local pharmacy to get the vaccine against shingles called Shingrix.  Shingrix is indicated for prevention of shingles in adults aged 82 years and older.  Shingrix is administered as 2 doses (2nd dose is administered anytime between 2 and 6 months later).  We do not have Shingrix in stock at this time.

## 2019-05-14 NOTE — Progress Notes (Signed)
Subjective:    Date: 05/14/2019 8:44 AM   Patient ID: Leslie Ray is a 84 y.o. female.    Chief Complaint   Patient presents with    Hypertension     f/u    Hyperlipidemia    Gout    Hypothyroidism    Allergic Rhinitis      HPI      Problem   Hypothyroid    Med:  Levothyroxine (Synthroid) brand only 75 mcg daily    Compliant with current regimen.  Denies side effects to therapy.  Denies any change in hair/nails/skin, constipation, unexplained wt changes.      Lab Results   Component Value Date    TSH 0.55 02/12/2019          Allergic Rhinitis    Med:  Albuterol inh prn  symbicort  singulair  flonase  Allegra or other antihistamine  Nasal saline spray    Pulm: Dr. Illene Bolus     May 14, 2019  Pt c/o allergies. Occasional headache and PND.  Worse when goes outside and with head.  + throat clearing.  +itchy/watery eyes + itching nose  Occasionally rhinorrhea.  Occasional sneezing.   Pt using albuterol about twice a month.  She reports she is not using symbicort regularly as prescribed, rather tends to use it PRN when wheezing.      Mild Intermittent Asthma    Albuterol inh prn  symbicort  singulair  flonase  Allegra     Pulm: Dr. Illene Bolus, pt is due for followup, pt states previous appointment was canceled and she has not rescheduled.     PFT 12/27/2018 - mild obstruction with a moderate decrease in diffusion capacity. No significant response to bronchodilator  Pt is not taking symbicort regularly.  Last used albuterol several weeks ago.  Pt states breathing is improved, back to baseline.     May 14, 2019  Stable.  Pt reports albuterol use about twice a week.         Gerd (Gastroesophageal Reflux Disease)    Esomeprazole 20 mg , taking 1 tablet daily      Pt is asymptomatic on current medication  No known history of GI ulcer.       Magnesium   Date Value Ref Range Status   02/12/2019 1.9 1.6 - 2.6 mg/dL Final     Vitamin Y-86   Date Value Ref Range Status   02/12/2019 788 211 - 911 pg/mL Final           Hyperlipidemia     On atorvastatin 40 mg daily      Pt has hyperlipidemia and is compliant with anti-lipidemic therapy.  Pt denies side effects of anti-lipidemic therapy - specifically denies myalgia.        Lab Results   Component Value Date    CHOL 128 02/12/2019     Lab Results   Component Value Date    HDL 40 02/12/2019     Lab Results   Component Value Date    LDL 71 02/12/2019     Lab Results   Component Value Date    TRIG 85 02/12/2019          Essential Hypertension    Med:  Amlodipine 10 mg   Toprol Xl 50 mg daily  Losartan 100 mg daily    Occasional shortness of breath when carrying heavy items.   Pt endorses a little dizziness. "I've always had it"  At end of day has  very slight swelling in legs.       Pt is compliant with current medication. No adverse effects reported to medication and compliance is reported to be good.   Pt denies CP/chest discomfort, palpitations, syncope or near syncope, orthopnea, or PND.    Home BP: not monitoring. Has a wrist cuff.  States needs to get a battery.    Renal Function Trend:  Lab Results   Component Value Date    CREAT 1.3 02/12/2019       Lab Results   Component Value Date    EGFR 39.2 02/12/2019       Lab Results   Component Value Date    WBC 11.72 (H) 02/12/2019    HGB 11.3 (L) 02/12/2019    HCT 36.0 02/12/2019    MCV 93.8 02/12/2019    PLT 309 02/12/2019      Pt was in the ED on 03/22/2019 for headache and CBC showed normal WBC and kidney function nml creat 1.0     Gout       Flare has occurred on both feet. Has been ~ 5 years ago      On allopurinol 100 mg daily    Lab Results   Component Value Date    URICACID 6.1 (H) 02/12/2019              Patient Active Problem List   Diagnosis    Gout    GERD (gastroesophageal reflux disease)    Hyperlipidemia    Essential hypertension    Allergic rhinitis    Mild intermittent asthma    Irregular bowel habits    Hypothyroid     Patient Care Team:  Jules Husbands, DO as PCP - General (Family Medicine)  Lisette Grinder, MD as Consulting Physician (Critical Care Medicine)    Immunization History   Administered Date(s) Administered    Influenza quadrivalent (IM) 3 Yrs & greater 09/28/2018    Pneumococcal 23 valent 03/18/2002    Pneumococcal Conjugate 13-Valent 07/03/2015    Tdap 05/14/2019     Current Outpatient Medications   Medication Sig Dispense Refill    Albuterol Sulfate 108 (90 Base) MCG/ACT Aerosol Powder, Breath Activtivatede Inhale 2 puffs into the lungs every 4 (four) hours as needed         allopurinol (ZYLOPRIM) 100 MG tablet Take 100 mg by mouth daily      amLODIPine (NORVASC) 10 MG tablet Take 1 tablet (10 mg total) by mouth daily 90 tablet 1    atorvastatin (LIPITOR) 40 MG tablet Take 1 tablet (40 mg total) by mouth daily 90 tablet 1    budesonide-formoterol (SYMBICORT) 160-4.5 MCG/ACT inhaler daily as needed      calcium-vitamin D (OSCAL 500/200 D-3) 500-200 MG-UNIT per tablet Take 1 tablet by mouth 2 (two) times daily 600mg  (1500mg ) - 200U tab         esomeprazole (NEXIUM) 20 MG capsule Take 20 mg by mouth every morning before breakfast      levothyroxine (SYNTHROID) 75 MCG tablet Take 1 tablet (75 mcg total) by mouth daily BRAND NAME ONLY 90 tablet 1    Loratadine (CLARITIN PO) Take 5 mg by mouth daily         losartan (COZAAR) 100 MG tablet Take 1 tablet (100 mg total) by mouth daily 90 tablet 1    meclizine (ANTIVERT) 12.5 MG tablet Take 25 mg by mouth every 8 (eight) hours as needed      metoprolol succinate XL (  TOPROL-XL) 50 MG 24 hr tablet Take 1 tablet (50 mg total) by mouth daily 90 tablet 1    montelukast (SINGULAIR) 10 MG tablet Take 1 tablet (10 mg total) by mouth nightly 90 tablet 1    Omega-3 Fatty Acids (OMEGA-3 FISH OIL) 500 MG Cap Take 2 capsules by mouth 2 (two) times daily       No current facility-administered medications for this visit.      Allergies   Allergen Reactions    Penicillins        Past Medical History:   Diagnosis Date    Allergies     Anemia     Arthritis      Asthma     Bowel habit changes     Chest pain     Disorder of thyroid     Gastroesophageal reflux disease     Gout     Hyperglycemia     Hyperlipidemia     Hypertension     Laxative habit     Murmur     Vertigo      Past Surgical History:   Procedure Laterality Date    BLADDER REPAIR  1999    CATARACT EXTRACTION, BILATERAL  2002    HYSTERECTOMY  1999    THYROIDECTOMY, PARTIAL  1989    tumor removed - patient states was not cancer       Family History   Problem Relation Age of Onset    Emphysema Mother         smoker    Heart attack Father 85    Emphysema Brother        Social History     Socioeconomic History    Marital status: Widowed     Spouse name: Not on file    Number of children: Not on file    Years of education: Not on file    Highest education level: Not on file   Occupational History    Not on file   Social Needs    Financial resource strain: Not on file    Food insecurity     Worry: Not on file     Inability: Not on file    Transportation needs     Medical: Not on file     Non-medical: Not on file   Tobacco Use    Smoking status: Never Smoker    Smokeless tobacco: Never Used   Substance and Sexual Activity    Alcohol use: Yes    Drug use: Never    Sexual activity: Not on file   Lifestyle    Physical activity     Days per week: Not on file     Minutes per session: Not on file    Stress: Not on file   Relationships    Social connections     Talks on phone: Not on file     Gets together: Not on file     Attends religious service: Not on file     Active member of club or organization: Not on file     Attends meetings of clubs or organizations: Not on file     Relationship status: Not on file    Intimate partner violence     Fear of current or ex partner: Not on file     Emotionally abused: Not on file     Physically abused: Not on file     Forced sexual activity: Not on file   Other Topics  Concern    Not on file   Social History Narrative    Not on file     The  following portions of the patient's history were reviewed and updated as appropriate: allergies, current medications, past family history, past medical history, past social history, past surgical history and problem list.    Review of Systems   HENT: Positive for postnasal drip and rhinorrhea. Negative for congestion.    Respiratory: Negative for shortness of breath.    Cardiovascular: Negative for chest pain.   Neurological: Negative for light-headedness and headaches.        Objective:   BP 124/75 (BP Site: Left arm)    Pulse 66    Temp 98.3 F (36.8 C) (Temporal)    Ht 1.562 m (5' 1.5")    Wt 58.9 kg (129 lb 12.8 oz)    BMI 24.13 kg/m   Wt Readings from Last 3 Encounters:   05/14/19 58.9 kg (129 lb 12.8 oz)   03/30/19 59.4 kg (131 lb)   02/12/19 58.5 kg (129 lb)     Physical Exam  Vitals signs and nursing note reviewed.   Constitutional:       General: She is not in acute distress.  Neck:      Musculoskeletal: Neck supple.      Thyroid: No thyroid mass or thyromegaly.      Vascular: No carotid bruit.   Cardiovascular:      Rate and Rhythm: Normal rate and regular rhythm.      Heart sounds: S1 normal and S2 normal. No murmur.      Comments:     Pulmonary:      Effort: Pulmonary effort is normal.      Breath sounds: Normal breath sounds. No decreased breath sounds, wheezing, rhonchi or rales.   Musculoskeletal:      Right lower leg: No edema.      Left lower leg: No edema.   Lymphadenopathy:      Cervical: No cervical adenopathy.      Upper Body:      Right upper body: No supraclavicular adenopathy.      Left upper body: No supraclavicular adenopathy.          Assessment/Plan:       1. Allergic rhinitis, unspecified seasonality, unspecified trigger  Uncontrolled  More consistent use of anthistamine and Nasal CS encouraged  Nasal saline PRN 3-4 times a day  Consider trial nasal rinse    2. Mild intermittent asthma without complication  Stable, no flare  Encourage compliance with symbicort inhaler  Cont albuterol  PRN  Cont singulair    3. Hyperlipidemia, unspecified hyperlipidemia type  Controlled  Continue current statin  - Lipid panel; Future    4. Gout of foot, unspecified cause, unspecified chronicity, unspecified laterality  Controlled, no recent flare  Uric acid close to goal  Continue allopurinol  Encouraged follow low purine diet  - Uric acid; Future    5. Gastroesophageal reflux disease, esophagitis presence not specified  Controlled  Continue current PPI    6. Essential hypertension  Controlled  Home BP monitoring  Continue current med  - Basic Metabolic Panel; Future    7. Need for tetanus, diphtheria, and acellular pertussis (Tdap) vaccine  - Tdap vaccine greater than or equal to 7yo IM    8. Hypothyroidism, unspecified type  Lab Results   Component Value Date    TSH 0.55 02/12/2019     Pt is clinically and chemically euthyroid.  Continue current thyroid replacement medication regimen. Repeat TFT's in 3-6 months.  Reviewed hypo and hyperthyroid symptoms that warrant sooner evaluation    - TSH; Future    No refills needed today  Labs before next visit  Shingrix vaccine due, advised to get at pharmacy      Return in about 3 months (around 08/14/2019).    Jules Husbands, DO

## 2019-05-14 NOTE — Progress Notes (Signed)
Have you seen any specialists/other providers since your last visit with us?    No    Arm preference verified?   Yes    The patient is due for  multiple

## 2019-07-06 ENCOUNTER — Other Ambulatory Visit (INDEPENDENT_AMBULATORY_CARE_PROVIDER_SITE_OTHER): Payer: Self-pay | Admitting: Family Medicine

## 2019-07-06 NOTE — Telephone Encounter (Signed)
Pt called to request refill of atorvastatin (LIPITOR) 40 MG tablet

## 2019-07-09 MED ORDER — ATORVASTATIN CALCIUM 40 MG PO TABS
40.0000 mg | ORAL_TABLET | Freq: Every day | ORAL | 3 refills | Status: DC
Start: 2019-07-09 — End: 2020-07-18

## 2019-07-24 ENCOUNTER — Other Ambulatory Visit (INDEPENDENT_AMBULATORY_CARE_PROVIDER_SITE_OTHER): Payer: Self-pay | Admitting: Family Medicine

## 2019-07-24 MED ORDER — AMLODIPINE BESYLATE 10 MG PO TABS
10.0000 mg | ORAL_TABLET | Freq: Every day | ORAL | 1 refills | Status: DC
Start: 2019-07-24 — End: 2020-01-25

## 2019-07-24 NOTE — Telephone Encounter (Signed)
Pt called to request refill of  amLODIPine (NORVASC) 10 MG tablet

## 2019-08-08 ENCOUNTER — Other Ambulatory Visit (FREE_STANDING_LABORATORY_FACILITY): Payer: Medicare Other

## 2019-08-08 DIAGNOSIS — E785 Hyperlipidemia, unspecified: Secondary | ICD-10-CM

## 2019-08-08 DIAGNOSIS — M109 Gout, unspecified: Secondary | ICD-10-CM

## 2019-08-08 DIAGNOSIS — I10 Essential (primary) hypertension: Secondary | ICD-10-CM

## 2019-08-08 DIAGNOSIS — E039 Hypothyroidism, unspecified: Secondary | ICD-10-CM

## 2019-08-08 LAB — BASIC METABOLIC PANEL
Anion Gap: 11 (ref 5.0–15.0)
BUN: 16 mg/dL (ref 7.0–19.0)
CO2: 24 mEq/L (ref 21–29)
Calcium: 9.8 mg/dL (ref 7.9–10.2)
Chloride: 104 mEq/L (ref 100–111)
Creatinine: 1 mg/dL (ref 0.4–1.5)
Glucose: 90 mg/dL (ref 70–100)
Potassium: 5 mEq/L (ref 3.5–5.1)
Sodium: 139 mEq/L (ref 136–145)

## 2019-08-08 LAB — LIPID PANEL
Cholesterol / HDL Ratio: 4.1
Cholesterol: 168 mg/dL (ref 0–199)
HDL: 41 mg/dL (ref 40–9999)
LDL Calculated: 93 mg/dL (ref 0–99)
Triglycerides: 169 mg/dL — ABNORMAL HIGH (ref 34–149)
VLDL Calculated: 34 mg/dL (ref 10–40)

## 2019-08-08 LAB — URIC ACID: Uric acid: 6 mg/dL (ref 2.6–6.0)

## 2019-08-08 LAB — GFR: EGFR: 53

## 2019-08-08 LAB — TSH: TSH: 5.04 u[IU]/mL — ABNORMAL HIGH (ref 0.35–4.94)

## 2019-08-08 LAB — HEMOLYSIS INDEX: Hemolysis Index: 1 (ref 0–18)

## 2019-08-14 ENCOUNTER — Encounter (INDEPENDENT_AMBULATORY_CARE_PROVIDER_SITE_OTHER): Payer: Self-pay | Admitting: Family Medicine

## 2019-08-14 ENCOUNTER — Ambulatory Visit (INDEPENDENT_AMBULATORY_CARE_PROVIDER_SITE_OTHER): Payer: Medicare Other | Admitting: Family Medicine

## 2019-08-14 VITALS — BP 162/85 | HR 69 | Temp 97.7°F | Ht 61.5 in | Wt 137.0 lb

## 2019-08-14 DIAGNOSIS — E039 Hypothyroidism, unspecified: Secondary | ICD-10-CM

## 2019-08-14 DIAGNOSIS — M109 Gout, unspecified: Secondary | ICD-10-CM

## 2019-08-14 DIAGNOSIS — H6121 Impacted cerumen, right ear: Secondary | ICD-10-CM

## 2019-08-14 DIAGNOSIS — J309 Allergic rhinitis, unspecified: Secondary | ICD-10-CM

## 2019-08-14 DIAGNOSIS — J4520 Mild intermittent asthma, uncomplicated: Secondary | ICD-10-CM

## 2019-08-14 DIAGNOSIS — E785 Hyperlipidemia, unspecified: Secondary | ICD-10-CM

## 2019-08-14 DIAGNOSIS — Z23 Encounter for immunization: Secondary | ICD-10-CM

## 2019-08-14 DIAGNOSIS — I10 Essential (primary) hypertension: Secondary | ICD-10-CM

## 2019-08-14 MED ORDER — LOSARTAN POTASSIUM 100 MG PO TABS
100.0000 mg | ORAL_TABLET | Freq: Every day | ORAL | 1 refills | Status: DC
Start: 2019-08-14 — End: 2020-03-14

## 2019-08-14 MED ORDER — MONTELUKAST SODIUM 10 MG PO TABS
10.0000 mg | ORAL_TABLET | Freq: Every evening | ORAL | 1 refills | Status: DC
Start: 2019-08-14 — End: 2020-02-12

## 2019-08-14 MED ORDER — METOPROLOL SUCCINATE ER 50 MG PO TB24
50.00 mg | ORAL_TABLET | Freq: Every day | ORAL | 1 refills | Status: DC
Start: 2019-08-14 — End: 2019-11-14

## 2019-08-14 NOTE — Assessment & Plan Note (Signed)
A/P: uric acid is controlled  Pt wants to try off allopurinol  Will plan to recheck uric acid in 3 months   Continue low purine diet

## 2019-08-14 NOTE — Addendum Note (Signed)
Addended by: Quincy Simmonds on: 08/14/2019 06:25 PM     Modules accepted: Orders

## 2019-08-14 NOTE — Assessment & Plan Note (Signed)
A/P:   Continue current medication regimen  Allergen avoidance as possible  Gave contact information for Dr. Newman Pies and Dr. Orland Mustard

## 2019-08-14 NOTE — Assessment & Plan Note (Signed)
A/P: At home BP measurements lower than in office.  Advised to monitor at home more consistently for 2 weeks and send log for review.     Continue current med  Continued to counsel on regular exercise, weight control, optimizing heart healthy diet    Recommend optimizing low sodium diet measures ( less than 2 grams of sodium in the diet per day ).

## 2019-08-14 NOTE — Progress Notes (Signed)
Have you seen any specialists/other providers since your last visit with us?    Yes, opthalmology     Arm preference verified?   Yes    The patient is due for multiple

## 2019-08-14 NOTE — Progress Notes (Addendum)
Subjective:    Date: 08/14/2019 6:18 PM   Patient ID: Leslie Ray is a 84 y.o. female.    Chief Complaint   Patient presents with    Allergic Rhinitis      f/u, recent labs 08/08/2019      Asthma    Hyperlipidemia    Hypertension     HPI    Problem   Hypothyroid    Med:  Levothyroxine (Synthroid) brand only 75 mcg daily (since 1989 when had partial thyroidectomy)  Pt takes synthroid in the AM and waits at least 3-4 hours before she eats breakfast or takes any other vitamins/medications    Compliant with current regimen.  Denies side effects to therapy.  Denies any change in hair/nails/skin, constipation, unexplained wt changes.      Lab Results   Component Value Date    TSH 5.04 (H) 08/08/2019    TSH 0.55 02/12/2019          Allergic Rhinitis    Med:  Albuterol inh prn  symbicort  singulair  flonase  Allegra or other antihistamine  Nasal saline spray    Pulm: Dr. Illene Bolus     May 14, 2019  Pt c/o allergies. Occasional headache and PND.  Worse when goes outside and with head.  + throat clearing.  +itchy/watery eyes + itching nose  Occasionally rhinorrhea.  Occasional sneezing.   Pt using albuterol about twice a month.  She reports she is not using symbicort regularly as prescribed, rather tends to use it PRN when wheezing.     08/14/2019  Pt reports doing fairly well, just sneezing a little more lately.  Dull headache.    She would like to change pulmonologist to once that is more local     Mild Intermittent Asthma    Albuterol inh prn  symbicort  singulair  flonase  Allegra     Pulm: Dr. Illene Bolus, pt is due for followup, pt states previous appointment was canceled and she has not rescheduled.     PFT 12/27/2018 - mild obstruction with a moderate decrease in diffusion capacity. No significant response to bronchodilator  Pt is not taking symbicort regularly.  Last used albuterol several weeks ago.  Pt states breathing is improved, back to baseline.     May 14, 2019  Stable.  Pt reports albuterol use about  twice a week.      08/14/2019  Stable, using albuterol < weekly     Hyperlipidemia     On atorvastatin 40 mg daily      Pt has hyperlipidemia and is compliant with anti-lipidemic therapy.  Pt denies side effects of anti-lipidemic therapy - specifically denies myalgia.        Lab Results   Component Value Date    CHOL 168 08/08/2019    CHOL 128 02/12/2019     Lab Results   Component Value Date    HDL 41 08/08/2019    HDL 40 02/12/2019     Lab Results   Component Value Date    LDL 93 08/08/2019    LDL 71 02/12/2019     Lab Results   Component Value Date    TRIG 169 (H) 08/08/2019    TRIG 85 02/12/2019          Essential Hypertension    Med:  Amlodipine 10 mg   Toprol Xl 50 mg daily  Losartan 100 mg daily    Occasional shortness of breath when carrying heavy items.   Pt  endorses a little dizziness. "I've always had it"  At end of day has very slight swelling in legs.       Pt is compliant with current medication. No adverse effects reported to medication and compliance is reported to be good.   Pt denies CP/chest discomfort, palpitations, syncope or near syncope, orthopnea, or PND.    Home BP:  Pt has been monitoring, although not often, reports measurements 120-144/70s    Renal Function Trend:  Lab Results   Component Value Date    CREAT 1.3 02/12/2019       Lab Results   Component Value Date    EGFR 39.2 02/12/2019       Lab Results   Component Value Date    WBC 11.72 (H) 02/12/2019    HGB 11.3 (L) 02/12/2019    HCT 36.0 02/12/2019    MCV 93.8 02/12/2019    PLT 309 02/12/2019      Pt was in the ED on 03/22/2019 for headache and CBC showed normal WBC and kidney function nml creat 1.0     Gout       Flare has occurred on both feet. Has been ~ 5 years ago      On allopurinol 100 mg daily    Pt stopped taking allopurinol about 2 months ago.  Felt she didn't really need it since she has not had a gout flare up.      Lab Results   Component Value Date    URICACID 6.0 08/08/2019    URICACID 6.1 (H) 02/12/2019            She  feels something in right ear.  There is excess cerumen. She has used OTC wax removal before.     Patient Active Problem List   Diagnosis    Gout    GERD (gastroesophageal reflux disease)    Hyperlipidemia    Essential hypertension    Allergic rhinitis    Mild intermittent asthma    Irregular bowel habits    Hypothyroid     Patient Care Team:  Jules Husbands, DO as PCP - General (Family Medicine)  Lisette Grinder, MD as Consulting Physician (Critical Care Medicine)    Immunization History   Administered Date(s) Administered    Influenza Vacc QUAD Recombinant PF 5yrs & up 08/14/2019    Influenza quadrivalent (IM) 6 months & up PRESERVED 09/28/2018    Pneumococcal 23 valent 03/18/2002    Pneumococcal Conjugate 13-Valent 07/03/2015    Tdap 05/14/2019     Current Outpatient Medications   Medication Sig Dispense Refill    Albuterol Sulfate 108 (90 Base) MCG/ACT Aerosol Powder, Breath Activtivatede Inhale 2 puffs into the lungs every 4 (four) hours as needed         amLODIPine (NORVASC) 10 MG tablet Take 1 tablet (10 mg total) by mouth daily 90 tablet 1    atorvastatin (LIPITOR) 40 MG tablet Take 1 tablet (40 mg total) by mouth daily 90 tablet 3    budesonide-formoterol (SYMBICORT) 160-4.5 MCG/ACT inhaler daily as needed      calcium-vitamin D (OSCAL 500/200 D-3) 500-200 MG-UNIT per tablet Take 1 tablet by mouth 2 (two) times daily 600mg  (1500mg ) - 200U tab         esomeprazole (NEXIUM) 20 MG capsule Take 20 mg by mouth every morning before breakfast      levothyroxine (SYNTHROID) 75 MCG tablet Take 1 tablet (75 mcg total) by mouth daily BRAND NAME ONLY 90 tablet 1  Loratadine (CLARITIN PO) Take 5 mg by mouth daily         losartan (COZAAR) 100 MG tablet Take 1 tablet (100 mg total) by mouth daily 90 tablet 1    meclizine (ANTIVERT) 12.5 MG tablet Take 25 mg by mouth every 8 (eight) hours as needed      metoprolol succinate XL (TOPROL-XL) 50 MG 24 hr tablet Take 1 tablet (50 mg total) by mouth  daily 90 tablet 1    montelukast (SINGULAIR) 10 MG tablet Take 1 tablet (10 mg total) by mouth nightly 90 tablet 1    Omega-3 Fatty Acids (OMEGA-3 FISH OIL) 500 MG Cap Take 2 capsules by mouth 2 (two) times daily      allopurinol (ZYLOPRIM) 100 MG tablet Take 100 mg by mouth daily       No current facility-administered medications for this visit.      Allergies   Allergen Reactions    Penicillins        Past Medical History:   Diagnosis Date    Allergies     Anemia     Arthritis     Asthma     Bowel habit changes     Chest pain     Disorder of thyroid     Gastroesophageal reflux disease     Gout     Hyperglycemia     Hyperlipidemia     Hypertension     Laxative habit     Murmur     Vertigo      Past Surgical History:   Procedure Laterality Date    BLADDER REPAIR  1999    CATARACT EXTRACTION, BILATERAL  2002    HYSTERECTOMY  1999    THYROIDECTOMY, PARTIAL  1989    tumor removed - patient states was not cancer       Family History   Problem Relation Age of Onset    Emphysema Mother         smoker    Heart attack Father 73    Emphysema Brother        Social History     Socioeconomic History    Marital status: Widowed     Spouse name: Not on file    Number of children: Not on file    Years of education: Not on file    Highest education level: Not on file   Occupational History    Not on file   Social Needs    Financial resource strain: Not on file    Food insecurity     Worry: Not on file     Inability: Not on file    Transportation needs     Medical: Not on file     Non-medical: Not on file   Tobacco Use    Smoking status: Never Smoker    Smokeless tobacco: Never Used   Substance and Sexual Activity    Alcohol use: Yes    Drug use: Never    Sexual activity: Not on file   Lifestyle    Physical activity     Days per week: Not on file     Minutes per session: Not on file    Stress: Not on file   Relationships    Social connections     Talks on phone: Not on file     Gets together:  Not on file     Attends religious service: Not on file     Active member of club  or organization: Not on file     Attends meetings of clubs or organizations: Not on file     Relationship status: Not on file    Intimate partner violence     Fear of current or ex partner: Not on file     Emotionally abused: Not on file     Physically abused: Not on file     Forced sexual activity: Not on file   Other Topics Concern    Not on file   Social History Narrative    Not on file     The following portions of the patient's history were reviewed and updated as appropriate: allergies, current medications, past family history, past medical history, past social history, past surgical history and problem list.    Review of Systems   Constitutional: Negative for unexpected weight change.   HENT: Positive for sneezing. Negative for congestion.    Respiratory: Negative for shortness of breath.    Cardiovascular: Negative for chest pain.   Neurological: Negative for light-headedness and headaches.        Objective:   BP 162/85 (BP Site: Left arm)    Pulse 69    Temp 97.7 F (36.5 C) (Temporal)    Ht 1.562 m (5' 1.5")    Wt 62.1 kg (137 lb)    BMI 25.47 kg/m   Wt Readings from Last 3 Encounters:   08/14/19 62.1 kg (137 lb)   05/14/19 58.9 kg (129 lb 12.8 oz)   03/30/19 59.4 kg (131 lb)     Physical Exam  Vitals signs and nursing note reviewed.   Constitutional:       General: She is not in acute distress.  HENT:      Ears:      Comments: Excess cerumen right ear however not completely occluding ear canal    Left ear clear  Neck:      Musculoskeletal: Neck supple.      Thyroid: No thyroid mass or thyromegaly.      Vascular: No carotid bruit.   Cardiovascular:      Rate and Rhythm: Normal rate and regular rhythm.      Heart sounds: S1 normal and S2 normal. Murmur present. Systolic murmur present with a grade of 2/6.      Comments:     Pulmonary:      Effort: Pulmonary effort is normal.      Breath sounds: Normal breath sounds. No  decreased breath sounds, wheezing, rhonchi or rales.   Musculoskeletal:      Right lower leg: No edema.      Left lower leg: No edema.   Lymphadenopathy:      Cervical: No cervical adenopathy.      Upper Body:      Right upper body: No supraclavicular adenopathy.      Left upper body: No supraclavicular adenopathy.   Psychiatric:         Attention and Perception: Attention normal.         Mood and Affect: Mood normal.          Assessment/Plan:       1. Hypothyroidism, unspecified type  - TSH; Future  - T4, free; Future    2. Flu vaccine need  - Flu Vaccine Recombinant Quadrivalent 18 yrs & up PRESERVATIVE FREE (Flublok)    3. Gout of foot, unspecified cause, unspecified chronicity, unspecified laterality  - Uric acid; Future    4. Essential hypertension    5. Allergic  rhinitis, unspecified seasonality, unspecified trigger    6. Mild intermittent asthma without complication    7. Hyperlipidemia, unspecified hyperlipidemia type    8. Excessive cerumen in ear canal, right  She will try OTC Debrox and if symptoms/wax remain, will schedule appointment for ear irrigation    Essential hypertension  A/P: At home BP measurements lower than in office.  Advised to monitor at home more consistently for 2 weeks and send log for review.     Continue current med  Continued to counsel on regular exercise, weight control, optimizing heart healthy diet    Recommend optimizing low sodium diet measures ( less than 2 grams of sodium in the diet per day ).      Allergic rhinitis  A/P:   Continue current medication regimen  Allergen avoidance as possible  Gave contact information for Dr. Newman Pies and Dr. Orland Mustard    Mild intermittent asthma  A/P:    Intermittent asthma. The patient is currently having an exacerbation. In general, the patient's disease is well controlled.     Review treatment goals of symptom prevention, minimizing limitation in activity, prevention of exacerbations and use of ER/inpatient care, maintenance of  optimal pulmonary function and minimization of adverse effects of treatment.  Medications: no change.  Discussed medication dosage, use, side effects, and goals of treatment in detail.    Warning signs of respiratory distress were reviewed with the patient.   Reduce exposure to inhaled allergens: use impermeable mattress and pillow covers, wash bedding weekly in water > 130'F to kill dust mites, vacuum 2x/week (the patient should not do the vacuuming) and avoid lying on upholstered furniture..       Hypothyroid  A/P: TSH slightly elevated  Will continue current dose for now, recheck TSH in 3 months    Gout  A/P: uric acid is controlled  Pt wants to try off allopurinol  Will plan to recheck uric acid in 3 months   Continue low purine diet    Hyperlipidemia  A/P: Increese in LDL and triglycerides most likely secondary to worsening dietary habits. Pt admits diet is not optimal    Continue current medication    Consume a healthy diet that emphasizes the intake of vegetables, fruits, nuts, whole grains, lean vegetable or animal protein, and fish and minimizes the intake of trans fats, red meat and process meats, refined carbohydrates, and sugar sweetened beverages.  Engage in at least 150 minutes per week of accumulated moderate-intensity physical activity           Return in about 3 months (around 11/14/2019) for chronic condition follow-up.    Jules Husbands, DO

## 2019-08-14 NOTE — Patient Instructions (Signed)
Please get your Shingles (Shingrix) vaccine at the local pharmacy

## 2019-08-14 NOTE — Assessment & Plan Note (Signed)
A/P:    Intermittent asthma. The patient is currently having an exacerbation. In general, the patient's disease is well controlled.     Review treatment goals of symptom prevention, minimizing limitation in activity, prevention of exacerbations and use of ER/inpatient care, maintenance of optimal pulmonary function and minimization of adverse effects of treatment.  Medications: no change.  Discussed medication dosage, use, side effects, and goals of treatment in detail.    Warning signs of respiratory distress were reviewed with the patient.   Reduce exposure to inhaled allergens: use impermeable mattress and pillow covers, wash bedding weekly in water > 130'F to kill dust mites, vacuum 2x/week (the patient should not do the vacuuming) and avoid lying on upholstered furniture.Marland Kitchen

## 2019-08-14 NOTE — Assessment & Plan Note (Signed)
A/P: TSH slightly elevated  Will continue current dose for now, recheck TSH in 3 months

## 2019-08-14 NOTE — Assessment & Plan Note (Signed)
A/P: Increese in LDL and triglycerides most likely secondary to worsening dietary habits. Pt admits diet is not optimal    Continue current medication    Consume a healthy diet that emphasizes the intake of vegetables, fruits, nuts, whole grains, lean vegetable or animal protein, and fish and minimizes the intake of trans fats, red meat and process meats, refined carbohydrates, and sugar sweetened beverages.  Engage in at least 150 minutes per week of accumulated moderate-intensity physical activity

## 2019-09-04 ENCOUNTER — Telehealth (INDEPENDENT_AMBULATORY_CARE_PROVIDER_SITE_OTHER): Payer: Self-pay | Admitting: Family Medicine

## 2019-09-04 NOTE — Telephone Encounter (Signed)
Please let patient know that I received her blood pressure log.    Overall blood pressures appear controlled with average ~145/72, 65 with occasional spikes.    Recommend continue current medications  Avoid excess sodium in diet    Monitor BP for 2 weeks prior to next follow-up visit and bring log to visit.

## 2019-09-05 NOTE — Telephone Encounter (Signed)
Outgoing call made to patient, She verbalized understanding of results and recommendations. She had no questions at this time.

## 2019-10-31 ENCOUNTER — Other Ambulatory Visit (INDEPENDENT_AMBULATORY_CARE_PROVIDER_SITE_OTHER): Payer: Self-pay | Admitting: Family Medicine

## 2019-11-09 ENCOUNTER — Other Ambulatory Visit (FREE_STANDING_LABORATORY_FACILITY): Payer: Medicare Other

## 2019-11-09 DIAGNOSIS — E039 Hypothyroidism, unspecified: Secondary | ICD-10-CM

## 2019-11-09 DIAGNOSIS — M109 Gout, unspecified: Secondary | ICD-10-CM

## 2019-11-09 LAB — HEMOLYSIS INDEX: Hemolysis Index: 3 (ref 0–18)

## 2019-11-09 LAB — URIC ACID: Uric acid: 7.3 mg/dL — ABNORMAL HIGH (ref 2.6–6.0)

## 2019-11-10 LAB — T4, FREE: T4 Free: 1.11 ng/dL (ref 0.70–1.48)

## 2019-11-10 LAB — TSH: TSH: 3.86 u[IU]/mL (ref 0.35–4.94)

## 2019-11-14 ENCOUNTER — Ambulatory Visit (INDEPENDENT_AMBULATORY_CARE_PROVIDER_SITE_OTHER): Payer: Medicare Other | Admitting: Family Medicine

## 2019-11-14 ENCOUNTER — Encounter (INDEPENDENT_AMBULATORY_CARE_PROVIDER_SITE_OTHER): Payer: Self-pay | Admitting: Family Medicine

## 2019-11-14 VITALS — BP 158/86 | HR 71 | Temp 98.2°F | Ht 61.5 in | Wt 141.0 lb

## 2019-11-14 DIAGNOSIS — K219 Gastro-esophageal reflux disease without esophagitis: Secondary | ICD-10-CM

## 2019-11-14 DIAGNOSIS — J4520 Mild intermittent asthma, uncomplicated: Secondary | ICD-10-CM

## 2019-11-14 DIAGNOSIS — E039 Hypothyroidism, unspecified: Secondary | ICD-10-CM

## 2019-11-14 DIAGNOSIS — I10 Essential (primary) hypertension: Secondary | ICD-10-CM

## 2019-11-14 DIAGNOSIS — E785 Hyperlipidemia, unspecified: Secondary | ICD-10-CM

## 2019-11-14 DIAGNOSIS — M109 Gout, unspecified: Secondary | ICD-10-CM

## 2019-11-14 MED ORDER — METOPROLOL SUCCINATE ER 100 MG PO TB24
100.00 mg | ORAL_TABLET | Freq: Every day | ORAL | 0 refills | Status: DC
Start: 2019-11-14 — End: 2020-02-08

## 2019-11-14 MED ORDER — ALLOPURINOL 100 MG PO TABS
100.0000 mg | ORAL_TABLET | Freq: Every day | ORAL | 1 refills | Status: DC
Start: 2019-11-14 — End: 2020-05-09

## 2019-11-14 NOTE — Assessment & Plan Note (Signed)
A/P: resume allopurinol 100 mg daily

## 2019-11-14 NOTE — Assessment & Plan Note (Signed)
A/P: Current blood pressure is above goal. Recommend increasing dose of B-Blocker therapy.  Side effects of B-Blocker therapy include bradycardia, dizziness, fatigue, depression or GI upset. Recommend out-of-office blood pressure measurements for titration of BP-lowering medication.

## 2019-11-14 NOTE — Progress Notes (Signed)
Have you seen any specialists/other providers since your last visit with us?    No    Arm preference verified?   Yes    The patient is due for  multiple

## 2019-11-14 NOTE — Assessment & Plan Note (Signed)
A/P: stable  Controlled  Continue pulmonary follow-up, pt to schedule followup with Dr. Illene Bolus

## 2019-11-14 NOTE — Assessment & Plan Note (Signed)
A/P:   Lab Results   Component Value Date    CHOL 168 08/08/2019    CHOL 128 02/12/2019    TRIG 169 (H) 08/08/2019    TRIG 85 02/12/2019    HDL 41 08/08/2019    HDL 40 02/12/2019    LDL 93 08/08/2019    LDL 71 02/12/2019     Recommend patient consume a healthy diet that emphasizes the intake of vegetables, fruits, nuts, whole grains, lean vegetable or animal protein, and fish and minimizes the intake of trans fats, red meat and process meats, refined carbohydrates, and sugar sweetened beverages.  Prior LDL values within acceptable limits on appropriate intensity statin medication. Continue current medication therapy.  Repeat lipid indices in 6 months.  Continue to a heart healthy diet and aerobic exercise efforts.

## 2019-11-14 NOTE — Assessment & Plan Note (Signed)
A/P:   Lab Results   Component Value Date    TSH 3.86 11/09/2019    T4FREE 1.11 11/09/2019     Pt is clinically euthyroid.  Prior TFT's are within target range. Continue current thyroid replacement medication regimen. Repeat surveillance TFT's in 6-12 months.  Reviewed hypo and hyperthyroid symptoms that warrant sooner evaluation

## 2019-11-14 NOTE — Progress Notes (Signed)
Subjective:    Date: 11/14/2019 3:56 PM   Patient ID: Leslie Ray is a 85 y.o. female.    Chief Complaint   Patient presents with    Hypothyroidism     f/u, fasting    Hyperlipidemia    Hypertension     HPI    Problem   Hypothyroid    Med:  Levothyroxine (Synthroid) brand only 75 mcg daily (since 1989 when had partial thyroidectomy)  Pt takes synthroid in the AM and waits at least 3-4 hours before she eats breakfast or takes any other vitamins/medications    Compliant with current regimen.  Denies side effects to therapy.  Denies any change in hair/nails/skin, constipation, unexplained wt changes.      Lab Results   Component Value Date    TSH 3.86 11/09/2019    TSH 5.04 (H) 08/08/2019    TSH 0.55 02/12/2019          Mild Intermittent Asthma    Albuterol inh prn  symbicort  singulair  flonase  Allegra     Pulm: Dr. Illene Bolus, pt is due for followup, pt states previous appointment was canceled and she has not rescheduled.     PFT 12/27/2018 - mild obstruction with a moderate decrease in diffusion capacity. No significant response to bronchodilator  Pt is not taking symbicort regularly.  Last used albuterol several weeks ago.  Pt states breathing is improved, back to baseline.     Pt reports doing well, occasional albuterol use, has not used in >1 month.  Has not heard herself wheeze since started symbicort.     Gerd (Gastroesophageal Reflux Disease)    Esomeprazole 20 mg , taking 1 tablet daily      Pt is asymptomatic on current medication  No known history of GI ulcer.       Magnesium   Date Value Ref Range Status   02/12/2019 1.9 1.6 - 2.6 mg/dL Final     Vitamin U-98   Date Value Ref Range Status   02/12/2019 788 211 - 911 pg/mL Final          Hyperlipidemia     On atorvastatin 40 mg daily      Pt has hyperlipidemia and is compliant with anti-lipidemic therapy.  Pt denies side effects of anti-lipidemic therapy - specifically denies myalgia.        Lab Results   Component Value Date    CHOL 168 08/08/2019     CHOL 128 02/12/2019     Lab Results   Component Value Date    HDL 41 08/08/2019    HDL 40 02/12/2019     Lab Results   Component Value Date    LDL 93 08/08/2019    LDL 71 02/12/2019     Lab Results   Component Value Date    TRIG 169 (H) 08/08/2019    TRIG 85 02/12/2019          Essential Hypertension    Med:  Amlodipine 10 mg   Toprol Xl 50 mg daily - increase to 100 mg 11/14/2019  Losartan 100 mg daily    Occasional shortness of breath when carrying heavy items.   Pt endorses a little dizziness. "I've always had it"  At end of day has very slight swelling in legs.       Pt is compliant with current medication. No adverse effects reported to medication and compliance is reported to be good.   Pt denies CP/chest discomfort, palpitations, syncope  or near syncope, orthopnea, or PND.    Home BP:  Pt has been monitoring, brought in BP log, 117-166/61-79 68-78    Renal Function Trend:  Lab Results   Component Value Date    CREAT 1.3 02/12/2019       Lab Results   Component Value Date    EGFR 39.2 02/12/2019       Lab Results   Component Value Date    WBC 11.72 (H) 02/12/2019    HGB 11.3 (L) 02/12/2019    HCT 36.0 02/12/2019    MCV 93.8 02/12/2019    PLT 309 02/12/2019      Pt was in the ED on 03/22/2019 for headache and CBC showed normal WBC and kidney function nml creat 1.0     Gout       Flare has occurred on both feet. Has been ~ 5 years ago      On allopurinol 100 mg daily    Reports increased joint pains, multiple joints about last 2 months.   No joint swelling or erythema    Lab Results   Component Value Date    URICACID 7.3 (H) 11/09/2019    URICACID 6.0 08/08/2019    URICACID 6.1 (H) 02/12/2019              Patient Active Problem List   Diagnosis    Gout    GERD (gastroesophageal reflux disease)    Hyperlipidemia    Essential hypertension    Allergic rhinitis    Mild intermittent asthma    Irregular bowel habits    Hypothyroid     Patient Care Team:  Jules Husbands, DO as PCP - General (Family  Medicine)  Loman Chroman, MD as Consulting Physician (Critical Care Medicine)    Immunization History   Administered Date(s) Administered    Influenza Vacc QUAD Recombinant PF 78yrs & up 08/14/2019    Influenza quadrivalent (IM) 6 months & up PRESERVED 09/28/2018    Pneumococcal 23 valent 03/18/2002    Pneumococcal Conjugate 13-Valent 07/03/2015    Tdap 05/14/2019     Current Outpatient Medications   Medication Sig Dispense Refill    Albuterol Sulfate 108 (90 Base) MCG/ACT Aerosol Powder, Breath Activtivatede Inhale 2 puffs into the lungs every 4 (four) hours as needed         allopurinol (ZYLOPRIM) 100 MG tablet Take 1 tablet (100 mg total) by mouth daily 90 tablet 1    amLODIPine (NORVASC) 10 MG tablet Take 1 tablet (10 mg total) by mouth daily 90 tablet 1    atorvastatin (LIPITOR) 40 MG tablet Take 1 tablet (40 mg total) by mouth daily 90 tablet 3    budesonide-formoterol (SYMBICORT) 160-4.5 MCG/ACT inhaler daily as needed      calcium-vitamin D (OSCAL 500/200 D-3) 500-200 MG-UNIT per tablet Take 1 tablet by mouth 2 (two) times daily 600mg  (1500mg ) - 200U tab         esomeprazole (NEXIUM) 20 MG capsule Take 20 mg by mouth every morning before breakfast      Loratadine (CLARITIN PO) Take 5 mg by mouth daily         losartan (COZAAR) 100 MG tablet Take 1 tablet (100 mg total) by mouth daily 90 tablet 1    meclizine (ANTIVERT) 12.5 MG tablet Take 25 mg by mouth every 8 (eight) hours as needed      montelukast (SINGULAIR) 10 MG tablet Take 1 tablet (10 mg total) by mouth nightly 90  tablet 1    Omega-3 Fatty Acids (OMEGA-3 FISH OIL) 500 MG Cap Take 2 capsules by mouth 2 (two) times daily      Synthroid 75 MCG tablet TAKE ONE TABLET BY MOUTH DAILY 90 tablet 0    metoprolol succinate (TOPROL-XL) 100 MG 24 hr tablet Take 1 tablet (100 mg total) by mouth daily 90 tablet 0     No current facility-administered medications for this visit.      Allergies   Allergen Reactions    Penicillins        Past  Medical History:   Diagnosis Date    Allergies     Anemia     Arthritis     Asthma     Bowel habit changes     Chest pain     Disorder of thyroid     Gastroesophageal reflux disease     Gout     Hyperglycemia     Hyperlipidemia     Hypertension     Laxative habit     Murmur     Vertigo      Past Surgical History:   Procedure Laterality Date    BLADDER REPAIR  1999    CATARACT EXTRACTION, BILATERAL  2002    HYSTERECTOMY  1999    THYROIDECTOMY, PARTIAL  1989    tumor removed - patient states was not cancer       Family History   Problem Relation Age of Onset    Emphysema Mother         smoker    Heart attack Father 75    Emphysema Brother        Social History     Socioeconomic History    Marital status: Widowed     Spouse name: Not on file    Number of children: Not on file    Years of education: Not on file    Highest education level: Not on file   Occupational History    Not on file   Social Needs    Financial resource strain: Not on file    Food insecurity     Worry: Not on file     Inability: Not on file    Transportation needs     Medical: Not on file     Non-medical: Not on file   Tobacco Use    Smoking status: Never Smoker    Smokeless tobacco: Never Used   Substance and Sexual Activity    Alcohol use: Yes    Drug use: Never    Sexual activity: Not on file   Lifestyle    Physical activity     Days per week: Not on file     Minutes per session: Not on file    Stress: Not on file   Relationships    Social connections     Talks on phone: Not on file     Gets together: Not on file     Attends religious service: Not on file     Active member of club or organization: Not on file     Attends meetings of clubs or organizations: Not on file     Relationship status: Not on file    Intimate partner violence     Fear of current or ex partner: Not on file     Emotionally abused: Not on file     Physically abused: Not on file     Forced sexual activity: Not on file  Other Topics  Concern    Not on file   Social History Narrative    Not on file     The following portions of the patient's history were reviewed and updated as appropriate: allergies, current medications, past family history, past medical history, past social history, past surgical history and problem list.    Review of Systems   Respiratory: Negative for shortness of breath.    Cardiovascular: Negative for chest pain.   Neurological: Negative for light-headedness and headaches.        Objective:   BP 158/86 (BP Site: Right arm)    Pulse 71    Temp 98.2 F (36.8 C) (Temporal)    Ht 1.562 m (5' 1.5")    Wt 64 kg (141 lb)    BMI 26.21 kg/m   Wt Readings from Last 3 Encounters:   11/14/19 64 kg (141 lb)   08/14/19 62.1 kg (137 lb)   05/14/19 58.9 kg (129 lb 12.8 oz)     Physical Exam  Vitals signs and nursing note reviewed.   Constitutional:       General: She is not in acute distress.     Appearance: She is not ill-appearing.   Neck:      Musculoskeletal: Neck supple.      Thyroid: No thyroid mass or thyromegaly.      Vascular: No carotid bruit.   Cardiovascular:      Rate and Rhythm: Normal rate and regular rhythm.      Heart sounds: S1 normal and S2 normal. No murmur.      Comments:     Pulmonary:      Effort: Pulmonary effort is normal.      Breath sounds: Normal breath sounds. No decreased breath sounds, wheezing, rhonchi or rales.   Musculoskeletal:      Right lower leg: No edema.      Left lower leg: No edema.   Lymphadenopathy:      Cervical: No cervical adenopathy.      Upper Body:      Right upper body: No supraclavicular adenopathy.      Left upper body: No supraclavicular adenopathy.   Neurological:      Mental Status: She is alert.            Assessment/Plan:       1. Essential hypertension  - metoprolol succinate (TOPROL-XL) 100 MG 24 hr tablet; Take 1 tablet (100 mg total) by mouth daily  Dispense: 90 tablet; Refill: 0    2. Mild intermittent asthma without complication    3. Hypothyroidism, unspecified type    4.  Hyperlipidemia, unspecified hyperlipidemia type    5. Gout of foot, unspecified cause, unspecified chronicity, unspecified laterality  - allopurinol (ZYLOPRIM) 100 MG tablet; Take 1 tablet (100 mg total) by mouth daily  Dispense: 90 tablet; Refill: 1    6. Gastroesophageal reflux disease, unspecified whether esophagitis present    Mild intermittent asthma  A/P: stable  Controlled  Continue pulmonary follow-up, pt to schedule followup with Dr. Illene Bolus    Hypothyroid  A/P:   Lab Results   Component Value Date    TSH 3.86 11/09/2019    T4FREE 1.11 11/09/2019     Pt is clinically euthyroid.  Prior TFT's are within target range. Continue current thyroid replacement medication regimen. Repeat surveillance TFT's in 6-12 months.  Reviewed hypo and hyperthyroid symptoms that warrant sooner evaluation      Hyperlipidemia  A/P:   Lab Results  Component Value Date    CHOL 168 08/08/2019    CHOL 128 02/12/2019    TRIG 169 (H) 08/08/2019    TRIG 85 02/12/2019    HDL 41 08/08/2019    HDL 40 02/12/2019    LDL 93 08/08/2019    LDL 71 02/12/2019     Recommend patient consume a healthy diet that emphasizes the intake of vegetables, fruits, nuts, whole grains, lean vegetable or animal protein, and fish and minimizes the intake of trans fats, red meat and process meats, refined carbohydrates, and sugar sweetened beverages.  Prior LDL values within acceptable limits on appropriate intensity statin medication. Continue current medication therapy.  Repeat lipid indices in 6 months.  Continue to a heart healthy diet and aerobic exercise efforts.      Gout  A/P: resume allopurinol 100 mg daily    Essential hypertension  A/P: Current blood pressure is above goal. Recommend increasing dose of B-Blocker therapy.  Side effects of B-Blocker therapy include bradycardia, dizziness, fatigue, depression or GI upset. Recommend out-of-office blood pressure measurements for titration of BP-lowering medication.      Return in about 1 month (around  12/15/2019) for hypertension follow-up.    Jules Husbands, DO

## 2019-12-14 ENCOUNTER — Ambulatory Visit (INDEPENDENT_AMBULATORY_CARE_PROVIDER_SITE_OTHER): Payer: Medicare Other | Admitting: Family Medicine

## 2019-12-14 ENCOUNTER — Encounter (INDEPENDENT_AMBULATORY_CARE_PROVIDER_SITE_OTHER): Payer: Self-pay | Admitting: Family Medicine

## 2019-12-14 VITALS — BP 151/80 | HR 71 | Temp 98.0°F | Ht 61.5 in | Wt 142.0 lb

## 2019-12-14 DIAGNOSIS — I10 Essential (primary) hypertension: Secondary | ICD-10-CM

## 2019-12-14 DIAGNOSIS — N1831 Chronic kidney disease, stage 3a: Secondary | ICD-10-CM

## 2019-12-14 DIAGNOSIS — M25512 Pain in left shoulder: Secondary | ICD-10-CM

## 2019-12-14 DIAGNOSIS — M25511 Pain in right shoulder: Secondary | ICD-10-CM

## 2019-12-14 LAB — BASIC METABOLIC PANEL
Anion Gap: 11 (ref 5.0–15.0)
BUN: 19 mg/dL (ref 7.0–19.0)
CO2: 23 mEq/L (ref 21–29)
Calcium: 9.6 mg/dL (ref 7.9–10.2)
Chloride: 104 mEq/L (ref 100–111)
Creatinine: 1 mg/dL (ref 0.4–1.5)
Glucose: 92 mg/dL (ref 70–100)
Potassium: 4.6 mEq/L (ref 3.5–5.1)
Sodium: 138 mEq/L (ref 136–145)

## 2019-12-14 LAB — PTH, INTACT: PTH Intact: 49.2 pg/mL (ref 9.0–72.0)

## 2019-12-14 LAB — PHOSPHORUS: Phosphorus: 3.9 mg/dL (ref 2.3–4.7)

## 2019-12-14 LAB — MICROALBUMIN, RANDOM URINE
Urine Creatinine, Random: 155.7 mg/dL
Urine Microalbumin, Random: 19 (ref 0.0–30.0)
Urine Microalbumin/Creatinine Ratio: 12 ug/mg (ref 0–30)

## 2019-12-14 LAB — VITAMIN D,25 OH,TOTAL: Vitamin D, 25 OH, Total: 50 ng/mL (ref 30–100)

## 2019-12-14 LAB — GFR: EGFR: 53

## 2019-12-14 LAB — HEMOLYSIS INDEX: Hemolysis Index: 1 (ref 0–18)

## 2019-12-14 MED ORDER — DICLOFENAC SODIUM 1 % EX GEL
CUTANEOUS | 1 refills | Status: DC
Start: 2019-12-14 — End: 2022-05-19

## 2019-12-14 NOTE — Assessment & Plan Note (Signed)
A/P: Current blood pressure is adequately controlled on the current medication regimen. BP shows lowering/improvement since last visit. Continue to optimize low salt diet and aerobic exercise efforts. and Continue current medication regimen. Recommend checking ambulatory blood pressures once or twice a day and document in a log.  Bring the log and blood pressure cuff into the office at the next follow-up visit or utilize MyChart to communicate BP readings

## 2019-12-14 NOTE — Assessment & Plan Note (Signed)
A/P: Monitor  Discussed avoidance of NSAIDS, contrast dye  Maintain BP control  Labs as ordered

## 2019-12-14 NOTE — Progress Notes (Signed)
Have you seen any specialists/other providers since your last visit with us?    No    Arm preference verified?   Yes    The patient is due for  multiple

## 2019-12-14 NOTE — Progress Notes (Signed)
Subjective:    Date: 12/14/2019 3:57 PM   Patient ID: Leslie Ray is a 85 y.o. female.    Chief Complaint   Patient presents with    Hypertension     f/u, fasting     HPI  Pt is c/o bilateral shoulder and arm achiness, sometimes more notable in the morning.  No injury/trauma.  Has been taking ibuprofen occasionally for the pain.  denis joint swelling.  Denies back pain, fever, chills. Denies weakness.    Problem   Stage 3a Chronic Kidney Disease        Vit D:   No results found for: VITD      No results found for: PTH    Renal Function Trend:  Lab Results   Component Value Date    CREAT 1.0 08/08/2019    CREAT 1.3 02/12/2019       Lab Results   Component Value Date    EGFR 53.0 08/08/2019    EGFR 39.2 02/12/2019          Essential Hypertension    Med:  Amlodipine 10 mg   Toprol Xl 50 mg daily - increase to 100 mg 11/14/2019  Losartan 100 mg daily    Occasional shortness of breath when carrying heavy items.   Pt has long history of dizziness, however not recently  At end of day has very slight swelling in legs.       Pt is compliant with current medication. No adverse effects reported to medication and compliance is reported to be good.   Pt denies CP/chest discomfort, palpitations, syncope or near syncope, orthopnea, or PND.    Home BP:  Pt has noticed improvement.  Log reviewed - shows BP avg 135/65 HR 68    Renal Function Trend:  Lab Results   Component Value Date    CREAT 1.0 08/08/2019    CREAT 1.3 02/12/2019       Lab Results   Component Value Date    EGFR 53.0 08/08/2019    EGFR 39.2 02/12/2019       Lab Results   Component Value Date    WBC 11.72 (H) 02/12/2019    HGB 11.3 (L) 02/12/2019    HCT 36.0 02/12/2019    MCV 93.8 02/12/2019    PLT 309 02/12/2019      Pt was in the ED on 03/22/2019 for headache and CBC showed normal WBC and kidney function nml creat 1.0         Patient Active Problem List   Diagnosis    Gout    GERD (gastroesophageal reflux disease)    Hyperlipidemia    Essential  hypertension    Allergic rhinitis    Mild intermittent asthma    Irregular bowel habits    Hypothyroid    Stage 3a chronic kidney disease     Patient Care Team:  Jules Husbands, DO as PCP - General (Family Medicine)  Loman Chroman, MD as Consulting Physician (Critical Care Medicine)    Immunization History   Administered Date(s) Administered    Influenza Vacc QUAD Recombinant PF 68yrs & up 08/14/2019    Influenza quadrivalent (IM) 6 months & up PRESERVED 09/28/2018    Pneumococcal 23 valent 03/18/2002    Pneumococcal Conjugate 13-Valent 07/03/2015    Tdap 05/14/2019     Current Outpatient Medications   Medication Sig Dispense Refill    Albuterol Sulfate 108 (90 Base) MCG/ACT Aerosol Powder, Breath Activtivatede Inhale 2 puffs into the  lungs every 4 (four) hours as needed         allopurinol (ZYLOPRIM) 100 MG tablet Take 1 tablet (100 mg total) by mouth daily 90 tablet 1    amLODIPine (NORVASC) 10 MG tablet Take 1 tablet (10 mg total) by mouth daily 90 tablet 1    atorvastatin (LIPITOR) 40 MG tablet Take 1 tablet (40 mg total) by mouth daily 90 tablet 3    budesonide-formoterol (SYMBICORT) 160-4.5 MCG/ACT inhaler daily as needed      calcium-vitamin D (OSCAL 500/200 D-3) 500-200 MG-UNIT per tablet Take 1 tablet by mouth 2 (two) times daily 600mg  (1500mg ) - 200U tab         esomeprazole (NEXIUM) 20 MG capsule Take 20 mg by mouth every morning before breakfast      Loratadine (CLARITIN PO) Take 5 mg by mouth daily         losartan (COZAAR) 100 MG tablet Take 1 tablet (100 mg total) by mouth daily 90 tablet 1    meclizine (ANTIVERT) 12.5 MG tablet Take 25 mg by mouth every 8 (eight) hours as needed      metoprolol succinate (TOPROL-XL) 100 MG 24 hr tablet Take 1 tablet (100 mg total) by mouth daily 90 tablet 0    montelukast (SINGULAIR) 10 MG tablet Take 1 tablet (10 mg total) by mouth nightly 90 tablet 1    Omega-3 Fatty Acids (OMEGA-3 FISH OIL) 500 MG Cap Take 2 capsules by mouth 2 (two)  times daily      Synthroid 75 MCG tablet TAKE ONE TABLET BY MOUTH DAILY 90 tablet 0    diclofenac Sodium (VOLTAREN) 1 % Gel topical gel Apply 4 g to affected area 4 times daily as needed.  Max: 16 g/joint/day.  Do not apply to broken, inflamed, or infected skin. 100 g 1     No current facility-administered medications for this visit.      Allergies   Allergen Reactions    Penicillins        Past Medical History:   Diagnosis Date    Allergies     Anemia     Arthritis     Asthma     Bowel habit changes     Chest pain     Disorder of thyroid     Gastroesophageal reflux disease     Gout     Hyperglycemia     Hyperlipidemia     Hypertension     Laxative habit     Murmur     Vertigo      Past Surgical History:   Procedure Laterality Date    BLADDER REPAIR  1999    CATARACT EXTRACTION, BILATERAL  2002    HYSTERECTOMY  1999    THYROIDECTOMY, PARTIAL  1989    tumor removed - patient states was not cancer       Family History   Problem Relation Age of Onset    Emphysema Mother         smoker    Heart attack Father 16    Emphysema Brother        Social History     Socioeconomic History    Marital status: Widowed     Spouse name: Not on file    Number of children: Not on file    Years of education: Not on file    Highest education level: Not on file   Occupational History    Not on file   Social Needs  Financial resource strain: Not on file    Food insecurity     Worry: Not on file     Inability: Not on file    Transportation needs     Medical: Not on file     Non-medical: Not on file   Tobacco Use    Smoking status: Never Smoker    Smokeless tobacco: Never Used   Substance and Sexual Activity    Alcohol use: Yes    Drug use: Never    Sexual activity: Not on file   Lifestyle    Physical activity     Days per week: Not on file     Minutes per session: Not on file    Stress: Not on file   Relationships    Social connections     Talks on phone: Not on file     Gets together: Not on file      Attends religious service: Not on file     Active member of club or organization: Not on file     Attends meetings of clubs or organizations: Not on file     Relationship status: Not on file    Intimate partner violence     Fear of current or ex partner: Not on file     Emotionally abused: Not on file     Physically abused: Not on file     Forced sexual activity: Not on file   Other Topics Concern    Not on file   Social History Narrative    Not on file     The following portions of the patient's history were reviewed and updated as appropriate: allergies, current medications, past family history, past medical history, past social history, past surgical history and problem list.    Review of Systems   Constitutional: Negative for chills and fever.   Musculoskeletal: Positive for arthralgias and myalgias. Negative for joint swelling.        Objective:   BP 151/80 (BP Site: Right arm)    Pulse 71    Temp 98 F (36.7 C) (Temporal)    Ht 1.562 m (5' 1.5")    Wt 64.4 kg (142 lb)    BMI 26.40 kg/m   Wt Readings from Last 3 Encounters:   12/14/19 64.4 kg (142 lb)   11/14/19 64 kg (141 lb)   08/14/19 62.1 kg (137 lb)     Physical Exam  Vitals signs reviewed.   Constitutional:       General: She is not in acute distress.     Appearance: She is not ill-appearing.   Musculoskeletal:      Comments:   Left shoulder  Pt is good ROM of bilateral shoulders, almost full extension and flexion and symmetric bilaterally.  No tenderness of the deltoid, trap, proximal biceps tendon, or AC joint       Skin:     General: Skin is warm.   Neurological:      Mental Status: She is alert.          Assessment/Plan:       1. Essential hypertension    2. Stage 3a chronic kidney disease  - Phosphorus  - Basic Metabolic Panel  - PTH, Intact  - Vitamin D,25 OH, Total  - Urine Microalbumin Random    3. Acute pain of both shoulders  Almost full ROM, no weakness, no paresthesias,  Suspect DJD/OA and muscle deconditioning  Discussed regular home  exercise program to stregthen and  condition muscles especially shoulder, back/neck, amrs, chest  Avoid NSAIDS   Try tylenol PRN  Will also rx voltaren PRN  - diclofenac Sodium (VOLTAREN) 1 % Gel topical gel; Apply 4 g to affected area 4 times daily as needed.  Max: 16 g/joint/day.  Do not apply to broken, inflamed, or infected skin.  Dispense: 100 g; Refill: 1    Essential hypertension  A/P: Current blood pressure is adequately controlled on the current medication regimen. BP shows lowering/improvement since last visit. Continue to optimize low salt diet and aerobic exercise efforts. and Continue current medication regimen. Recommend checking ambulatory blood pressures once or twice a day and document in a log.  Bring the log and blood pressure cuff into the office at the next follow-up visit or utilize MyChart to communicate BP readings    Stage 3a chronic kidney disease  A/P: Monitor  Discussed avoidance of NSAIDS, contrast dye  Maintain BP control  Labs as ordered    Patient is on office waiting list for covid19 vaccine    Shingles (Shingrix) vaccine was recommended.  Advised patient get at local pharmacy.    Return in about 3 months (around 03/12/2020) for chronic disease management, Return sooner as needed.    Jules Husbands, DO

## 2020-01-04 ENCOUNTER — Other Ambulatory Visit (INDEPENDENT_AMBULATORY_CARE_PROVIDER_SITE_OTHER): Payer: Self-pay

## 2020-01-08 ENCOUNTER — Ambulatory Visit (INDEPENDENT_AMBULATORY_CARE_PROVIDER_SITE_OTHER): Payer: Medicare Other

## 2020-01-08 DIAGNOSIS — Z23 Encounter for immunization: Secondary | ICD-10-CM

## 2020-01-08 NOTE — Progress Notes (Signed)
COVID-19 Vaccination:  Vontrice Otis   [birth year]  01/08/2020     Patient presented to the office for the following COVID-19 vaccine administration:    [x]  Jansen/J+J (single dose)  []  Pfizer-BioNTech  []  Moderna      []  Dose #1   []  Dose #2   (Both doses of the series should be completed with the same product.)    [x]  A verbal consent has been obtained from patient to administer COVID-19 vaccine that is currently being offered under Emergency Use Authorization (EUA). Patient/Caregiver has been provided a copy of EUA.      [x]  Pt has not received other non-COVID vaccinations within 14 days of administration of COVID-19 vaccination.  [x]  Pt was screened for precautions and contraindications to vaccine  []  24-28+ day interval observed between first and second dose (Moderna)  []  17-21+ day interval observed between first and second dose (Pfizer-BioNTech)

## 2020-01-25 ENCOUNTER — Other Ambulatory Visit (INDEPENDENT_AMBULATORY_CARE_PROVIDER_SITE_OTHER): Payer: Self-pay | Admitting: Family Medicine

## 2020-01-29 ENCOUNTER — Other Ambulatory Visit (INDEPENDENT_AMBULATORY_CARE_PROVIDER_SITE_OTHER): Payer: Self-pay | Admitting: Family Medicine

## 2020-02-08 ENCOUNTER — Other Ambulatory Visit (INDEPENDENT_AMBULATORY_CARE_PROVIDER_SITE_OTHER): Payer: Self-pay | Admitting: Family Medicine

## 2020-02-08 DIAGNOSIS — I10 Essential (primary) hypertension: Secondary | ICD-10-CM

## 2020-02-12 ENCOUNTER — Other Ambulatory Visit (INDEPENDENT_AMBULATORY_CARE_PROVIDER_SITE_OTHER): Payer: Self-pay | Admitting: Family Medicine

## 2020-03-14 ENCOUNTER — Other Ambulatory Visit (INDEPENDENT_AMBULATORY_CARE_PROVIDER_SITE_OTHER): Payer: Self-pay | Admitting: Family Medicine

## 2020-03-14 ENCOUNTER — Encounter (INDEPENDENT_AMBULATORY_CARE_PROVIDER_SITE_OTHER): Payer: Self-pay | Admitting: Family Medicine

## 2020-03-14 ENCOUNTER — Ambulatory Visit (INDEPENDENT_AMBULATORY_CARE_PROVIDER_SITE_OTHER): Payer: Medicare Other | Admitting: Family Medicine

## 2020-03-14 VITALS — BP 143/74 | HR 71 | Ht 61.5 in

## 2020-03-14 DIAGNOSIS — K219 Gastro-esophageal reflux disease without esophagitis: Secondary | ICD-10-CM

## 2020-03-14 DIAGNOSIS — Z79899 Other long term (current) drug therapy: Secondary | ICD-10-CM

## 2020-03-14 DIAGNOSIS — N1831 Chronic kidney disease, stage 3a: Secondary | ICD-10-CM

## 2020-03-14 DIAGNOSIS — M109 Gout, unspecified: Secondary | ICD-10-CM

## 2020-03-14 DIAGNOSIS — I10 Essential (primary) hypertension: Secondary | ICD-10-CM

## 2020-03-14 DIAGNOSIS — Z780 Asymptomatic menopausal state: Secondary | ICD-10-CM

## 2020-03-14 DIAGNOSIS — E785 Hyperlipidemia, unspecified: Secondary | ICD-10-CM

## 2020-03-14 DIAGNOSIS — E039 Hypothyroidism, unspecified: Secondary | ICD-10-CM

## 2020-03-14 DIAGNOSIS — Z7189 Other specified counseling: Secondary | ICD-10-CM

## 2020-03-14 LAB — CBC AND DIFFERENTIAL
Absolute NRBC: 0 10*3/uL (ref 0.00–0.00)
Basophils Absolute Automated: 0.07 10*3/uL (ref 0.00–0.08)
Basophils Automated: 0.7 %
Eosinophils Absolute Automated: 0.56 10*3/uL — ABNORMAL HIGH (ref 0.00–0.44)
Eosinophils Automated: 5.6 %
Hematocrit: 38.9 % (ref 34.7–43.7)
Hgb: 11.4 g/dL (ref 11.4–14.8)
Immature Granulocytes Absolute: 0.06 10*3/uL (ref 0.00–0.07)
Immature Granulocytes: 0.6 %
Lymphocytes Absolute Automated: 2.64 10*3/uL (ref 0.42–3.22)
Lymphocytes Automated: 26.6 %
MCH: 29.3 pg (ref 25.1–33.5)
MCHC: 29.3 g/dL — ABNORMAL LOW (ref 31.5–35.8)
MCV: 100 fL — ABNORMAL HIGH (ref 78.0–96.0)
MPV: 9.4 fL (ref 8.9–12.5)
Monocytes Absolute Automated: 0.89 10*3/uL — ABNORMAL HIGH (ref 0.21–0.85)
Monocytes: 9 %
Neutrophils Absolute: 5.7 10*3/uL (ref 1.10–6.33)
Neutrophils: 57.5 %
Nucleated RBC: 0 /100 WBC (ref 0.0–0.0)
Platelets: 361 10*3/uL — ABNORMAL HIGH (ref 142–346)
RBC: 3.89 10*6/uL — ABNORMAL LOW (ref 3.90–5.10)
RDW: 15 % (ref 11–15)
WBC: 9.92 10*3/uL — ABNORMAL HIGH (ref 3.10–9.50)

## 2020-03-14 LAB — COMPREHENSIVE METABOLIC PANEL
ALT: 17 U/L (ref 0–55)
AST (SGOT): 23 U/L (ref 5–34)
Albumin/Globulin Ratio: 1.4 (ref 0.9–2.2)
Albumin: 4.1 g/dL (ref 3.5–5.0)
Alkaline Phosphatase: 85 U/L (ref 37–106)
Anion Gap: 9 (ref 5.0–15.0)
BUN: 15 mg/dL (ref 7.0–19.0)
Bilirubin, Total: 0.4 mg/dL (ref 0.2–1.2)
CO2: 25 mEq/L (ref 21–29)
Calcium: 9.6 mg/dL (ref 7.9–10.2)
Chloride: 104 mEq/L (ref 100–111)
Creatinine: 1.1 mg/dL (ref 0.4–1.5)
Globulin: 2.9 g/dL (ref 2.0–3.7)
Glucose: 95 mg/dL (ref 70–100)
Potassium: 4.6 mEq/L (ref 3.5–5.1)
Protein, Total: 7 g/dL (ref 6.0–8.3)
Sodium: 138 mEq/L (ref 136–145)

## 2020-03-14 LAB — LIPID PANEL
Cholesterol / HDL Ratio: 4.6
Cholesterol: 165 mg/dL (ref 0–199)
HDL: 36 mg/dL — ABNORMAL LOW (ref 40–9999)
LDL Calculated: 85 mg/dL (ref 0–99)
Triglycerides: 222 mg/dL — ABNORMAL HIGH (ref 34–149)
VLDL Calculated: 44 mg/dL — ABNORMAL HIGH (ref 10–40)

## 2020-03-14 LAB — GFR: EGFR: 47.4

## 2020-03-14 LAB — URIC ACID: Uric acid: 5.9 mg/dL (ref 2.6–6.0)

## 2020-03-14 LAB — MAGNESIUM: Magnesium: 1.8 mg/dL (ref 1.6–2.6)

## 2020-03-14 LAB — HEMOLYSIS INDEX: Hemolysis Index: 2 (ref 0–18)

## 2020-03-14 MED ORDER — LOSARTAN POTASSIUM 100 MG PO TABS
100.0000 mg | ORAL_TABLET | Freq: Every day | ORAL | 3 refills | Status: DC
Start: 2020-03-14 — End: 2021-03-05

## 2020-03-14 NOTE — Assessment & Plan Note (Signed)
A/P: Current blood pressure is adequately controlled on the current medication regimen. Continue current medication regimen. Recommend checking ambulatory blood pressures once or twice a day and document in a log.  Bring the log and blood pressure cuff into the office at the next follow-up visit or utilize MyChart to communicate BP readings     Avoid diuretic due to gout

## 2020-03-14 NOTE — Assessment & Plan Note (Signed)
A/P: continue current medication  Continue low purine diet  Check uric acid

## 2020-03-14 NOTE — Progress Notes (Signed)
Subjective:    Date: 03/14/2020 8:38 AM   Patient ID: Leslie Ray is a 85 y.o. female.    Chief Complaint   Patient presents with    Hypertension     f/u, fasting    Chronic Kidney Disease     HPI    Problem   Counseling On Health Promotion and Disease Prevention      Pt reports cost is too much for shingles vaccine and was waiting for the cost to go down     Stage 3a Chronic Kidney Disease        Vit D:     Lab Results   Component Value Date    VITD 50 12/14/2019         No results found for: PTH    Renal Function Trend:  Lab Results   Component Value Date    CREAT 1.0 12/14/2019    CREAT 1.0 08/08/2019    CREAT 1.3 02/12/2019       Lab Results   Component Value Date    EGFR 53.0 12/14/2019    EGFR 53.0 08/08/2019    EGFR 39.2 02/12/2019          Hypothyroid    Med:  Levothyroxine (Synthroid) brand only 75 mcg daily (since 1989 when had partial thyroidectomy)  Pt takes synthroid in the AM and waits at least 3-4 hours before she eats breakfast or takes any other vitamins/medications    Compliant with current regimen.  Denies side effects to therapy.  Denies any change in hair/nails/skin, constipation, unexplained wt changes.      Lab Results   Component Value Date    TSH 3.86 11/09/2019    TSH 5.04 (H) 08/08/2019    TSH 0.55 02/12/2019          Gerd (Gastroesophageal Reflux Disease)    Esomeprazole 20 mg , taking 1 tablet daily      Pt is asymptomatic on current medication  No known history of GI ulcer.       Magnesium   Date Value Ref Range Status   02/12/2019 1.9 1.6 - 2.6 mg/dL Final     Vitamin Z-61   Date Value Ref Range Status   02/12/2019 788 211 - 911 pg/mL Final          Hyperlipidemia     On atorvastatin 40 mg daily      Pt has hyperlipidemia and is compliant with anti-lipidemic therapy.  Pt denies side effects of anti-lipidemic therapy - specifically denies myalgia.        Lab Results   Component Value Date    CHOL 168 08/08/2019    CHOL 128 02/12/2019     Lab Results   Component Value Date     HDL 41 08/08/2019    HDL 40 02/12/2019     Lab Results   Component Value Date    LDL 93 08/08/2019    LDL 71 02/12/2019     Lab Results   Component Value Date    TRIG 169 (H) 08/08/2019    TRIG 85 02/12/2019          Essential Hypertension    Med:  Amlodipine 10 mg   Toprol Xl 50 mg daily - increase to 100 mg 11/14/2019  Losartan 100 mg daily    Occasional shortness of breath when carrying heavy items.   Pt has long history of dizziness, however not recently  At end of day has very slight swelling in  legs.       Pt is compliant with current medication. No adverse effects reported to medication and compliance is reported to be good.   Pt denies CP/chest discomfort, palpitations, syncope or near syncope, orthopnea, or PND.    Home BP:  Pt reports h as been checking on and off, did not bring log and does not recall measurements.     Pt was in the ED on 03/22/2019 for headache and CBC showed normal WBC and kidney function nml creat 1.0    Renal Function Trend:  Lab Results   Component Value Date    CREAT 1.0 12/14/2019    CREAT 1.0 08/08/2019    CREAT 1.3 02/12/2019       Lab Results   Component Value Date    EGFR 53.0 12/14/2019    EGFR 53.0 08/08/2019    EGFR 39.2 02/12/2019       Lab Results   Component Value Date    WBC 11.72 (H) 02/12/2019    HGB 11.3 (L) 02/12/2019    HCT 36.0 02/12/2019    MCV 93.8 02/12/2019    PLT 309 02/12/2019          Gout       Flare has occurred on both feet. Has been ~ 5 years ago      On allopurinol 100 mg daily    No interim flare    Lab Results   Component Value Date    URICACID 7.3 (H) 11/09/2019    URICACID 6.0 08/08/2019    URICACID 6.1 (H) 02/12/2019              Patient Active Problem List   Diagnosis    Gout    GERD (gastroesophageal reflux disease)    Hyperlipidemia    Essential hypertension    Allergic rhinitis    Mild intermittent asthma    Irregular bowel habits    Hypothyroid    Stage 3a chronic kidney disease    Counseling on health promotion and disease prevention      Patient Care Team:  Jules Husbands, DO as PCP - General (Family Medicine)  Loman Chroman, MD as Consulting Physician (Critical Care Medicine)    Immunization History   Administered Date(s) Administered    COVID-19 Ad26 Vaccine Preservative Free 0.5 mL (JANSSEN) 01/08/2020    Influenza Vacc QUAD Recombinant PF 17yrs & up 08/14/2019    Influenza quadrivalent (IM) 6 months & up PRESERVED 09/28/2018    Pneumococcal 23 valent 03/18/2002    Pneumococcal Conjugate 13-Valent 07/03/2015    Tdap 05/14/2019     Current Outpatient Medications   Medication Sig Dispense Refill    Albuterol Sulfate 108 (90 Base) MCG/ACT Aerosol Powder, Breath Activtivatede Inhale 2 puffs into the lungs every 4 (four) hours as needed         allopurinol (ZYLOPRIM) 100 MG tablet Take 1 tablet (100 mg total) by mouth daily 90 tablet 1    amLODIPine (NORVASC) 10 MG tablet TAKE ONE TABLET BY MOUTH DAILY 90 tablet 3    atorvastatin (LIPITOR) 40 MG tablet Take 1 tablet (40 mg total) by mouth daily 90 tablet 3    budesonide-formoterol (SYMBICORT) 160-4.5 MCG/ACT inhaler daily as needed      calcium-vitamin D (OSCAL 500/200 D-3) 500-200 MG-UNIT per tablet Take 1 tablet by mouth 2 (two) times daily 600mg  (1500mg ) - 200U tab         diclofenac Sodium (VOLTAREN) 1 % Gel topical gel Apply  4 g to affected area 4 times daily as needed.  Max: 16 g/joint/day.  Do not apply to broken, inflamed, or infected skin. 100 g 1    esomeprazole (NEXIUM) 20 MG capsule Take 20 mg by mouth every morning before breakfast      Loratadine (CLARITIN PO) Take 5 mg by mouth daily         losartan (COZAAR) 100 MG tablet Take 1 tablet (100 mg total) by mouth daily 90 tablet 3    meclizine (ANTIVERT) 12.5 MG tablet Take 25 mg by mouth every 8 (eight) hours as needed      metoprolol succinate (TOPROL-XL) 100 MG 24 hr tablet TAKE ONE TABLET BY MOUTH DAILY 90 tablet 3    montelukast (SINGULAIR) 10 MG tablet TAKE ONE TABLET BY MOUTH NIGHTLY 90 tablet 0     Omega-3 Fatty Acids (OMEGA-3 FISH OIL) 500 MG Cap Take 2 capsules by mouth 2 (two) times daily      Synthroid 75 MCG tablet TAKE ONE TABLET BY MOUTH DAILY 90 tablet 3     No current facility-administered medications for this visit.     Allergies   Allergen Reactions    Penicillins        Past Medical History:   Diagnosis Date    Allergies     Anemia     Arthritis     Asthma     Bowel habit changes     Chest pain     Disorder of thyroid     Gastroesophageal reflux disease     Gout     Hyperglycemia     Hyperlipidemia     Hypertension     Laxative habit     Murmur     Vertigo      Past Surgical History:   Procedure Laterality Date    BLADDER REPAIR  1999    CATARACT EXTRACTION, BILATERAL  2002    HYSTERECTOMY  1999    THYROIDECTOMY, PARTIAL  1989    tumor removed - patient states was not cancer       Family History   Problem Relation Age of Onset    Emphysema Mother         smoker    Heart attack Father 54    Emphysema Brother        Social History     Socioeconomic History    Marital status: Widowed     Spouse name: Not on file    Number of children: Not on file    Years of education: Not on file    Highest education level: Not on file   Occupational History    Not on file   Tobacco Use    Smoking status: Never Smoker    Smokeless tobacco: Never Used   Substance and Sexual Activity    Alcohol use: Yes    Drug use: Never    Sexual activity: Not on file   Other Topics Concern    Not on file   Social History Narrative    Not on file     Social Determinants of Health     Financial Resource Strain:     Difficulty of Paying Living Expenses:    Food Insecurity:     Worried About Running Out of Food in the Last Year:     Ran Out of Food in the Last Year:    Transportation Needs:     Freight forwarder (Medical):     Lack  of Transportation (Non-Medical):    Physical Activity:     Days of Exercise per Week:     Minutes of Exercise per Session:    Stress:     Feeling of Stress :     Social Connections:     Frequency of Communication with Friends and Family:     Frequency of Social Gatherings with Friends and Family:     Attends Religious Services:     Active Member of Clubs or Organizations:     Attends Banker Meetings:     Marital Status:    Intimate Partner Violence:     Fear of Current or Ex-Partner:     Emotionally Abused:     Physically Abused:     Sexually Abused:      The following portions of the patient's history were reviewed and updated as appropriate: allergies, current medications, past family history, past medical history, past social history, past surgical history and problem list.    Review of Systems   Constitutional: Negative for chills and fever.   Respiratory: Negative for shortness of breath.    Cardiovascular: Negative for chest pain.   Gastrointestinal: Positive for constipation.   Neurological: Negative for light-headedness and headaches.        Objective:   BP 143/74 (BP Site: Left arm, Patient Position: Sitting, Cuff Size: Small)    Pulse 71    Ht 1.562 m (5' 1.5")    BMI 26.40 kg/m   Wt Readings from Last 3 Encounters:   12/14/19 64.4 kg (142 lb)   11/14/19 64 kg (141 lb)   08/14/19 62.1 kg (137 lb)     Physical Exam  Vitals and nursing note reviewed.   Constitutional:       General: She is not in acute distress.  Neck:      Thyroid: No thyroid mass or thyromegaly.      Vascular: No carotid bruit.   Cardiovascular:      Rate and Rhythm: Normal rate and regular rhythm.      Heart sounds: S1 normal and S2 normal. No murmur.      Comments:     Pulmonary:      Effort: Pulmonary effort is normal.      Breath sounds: Normal breath sounds. No decreased breath sounds, wheezing, rhonchi or rales.   Musculoskeletal:      Cervical back: Neck supple.      Right lower leg: No edema.      Left lower leg: No edema.   Lymphadenopathy:      Cervical: No cervical adenopathy.      Upper Body:      Right upper body: No supraclavicular adenopathy.      Left  upper body: No supraclavicular adenopathy.            Assessment/Plan:       1. Hyperlipidemia, unspecified hyperlipidemia type  - Lipid panel  - Comprehensive metabolic panel    2. Essential hypertension  - CBC and differential  - Comprehensive metabolic panel    3. Gastroesophageal reflux disease, unspecified whether esophagitis present  - Vitamin B12  - Magnesium    4. Hypothyroidism, unspecified type    5. Stage 3a chronic kidney disease  - CBC and differential  - Comprehensive metabolic panel    6. Gout of foot, unspecified cause, unspecified chronicity, unspecified laterality  - Uric acid    7. Counseling on health promotion and disease prevention    8. Postmenopausal  state  - Dxa Bone Density Axial Skeleton    9. Medication management  - Vitamin B12  - Magnesium    Gout  A/P: continue current medication  Continue low purine diet  Check uric acid     Hyperlipidemia  A/P:   Lab Results   Component Value Date    CHOL 168 08/08/2019    CHOL 128 02/12/2019    TRIG 169 (H) 08/08/2019    TRIG 85 02/12/2019    HDL 41 08/08/2019    HDL 40 02/12/2019    LDL 93 08/08/2019    LDL 71 02/12/2019     Recommend patient consume a healthy diet that emphasizes the intake of vegetables, fruits, nuts, whole grains, lean vegetable or animal protein, and fish and minimizes the intake of trans fats, red meat and process meats, refined carbohydrates, and sugar sweetened beverages.  Recommend patient engage in at least 150 minutes per week of accumulated moderate-intensity physical activity or 75 minutes per week of vigorous-intensity physical activity.  Prior LDL values within acceptable limits on appropriate intensity statin medication.  Continue current medication therapy.  Obtain lipid indices today.  Continue to optimize a heart healthy diet and aerobic exercise efforts.      Stage 3a chronic kidney disease  A/P: stable, maintain BP control  Avoid NSAIDS    Essential hypertension  A/P: Current blood pressure is adequately  controlled on the current medication regimen. Continue current medication regimen. Recommend checking ambulatory blood pressures once or twice a day and document in a log.  Bring the log and blood pressure cuff into the office at the next follow-up visit or utilize MyChart to communicate BP readings     Avoid diuretic due to gout      Encouraged to get shingles vaccine, pt concerned with cost and waiting for cost to go down    Return in about 6 months (around 09/14/2020) for medicare wellness visit, Return sooner as needed.    Jules Husbands, DO

## 2020-03-14 NOTE — Assessment & Plan Note (Signed)
A/P:   Lab Results   Component Value Date    CHOL 168 08/08/2019    CHOL 128 02/12/2019    TRIG 169 (H) 08/08/2019    TRIG 85 02/12/2019    HDL 41 08/08/2019    HDL 40 02/12/2019    LDL 93 08/08/2019    LDL 71 02/12/2019     Recommend patient consume a healthy diet that emphasizes the intake of vegetables, fruits, nuts, whole grains, lean vegetable or animal protein, and fish and minimizes the intake of trans fats, red meat and process meats, refined carbohydrates, and sugar sweetened beverages.  Recommend patient engage in at least 150 minutes per week of accumulated moderate-intensity physical activity or 75 minutes per week of vigorous-intensity physical activity.  Prior LDL values within acceptable limits on appropriate intensity statin medication.  Continue current medication therapy.  Obtain lipid indices today.  Continue to optimize a heart healthy diet and aerobic exercise efforts.

## 2020-03-14 NOTE — Assessment & Plan Note (Signed)
A/P: stable, maintain BP control  Avoid NSAIDS

## 2020-03-14 NOTE — Patient Instructions (Signed)
Please use this website as a resource to start completing your Advance Directive for healthcare    [email or web address]    We ask that you return a copy of to our office for your records

## 2020-03-15 LAB — VITAMIN B12: Vitamin B-12: 749 pg/mL (ref 211–911)

## 2020-04-10 ENCOUNTER — Encounter (INDEPENDENT_AMBULATORY_CARE_PROVIDER_SITE_OTHER): Payer: Self-pay | Admitting: Family Medicine

## 2020-04-17 ENCOUNTER — Other Ambulatory Visit (INDEPENDENT_AMBULATORY_CARE_PROVIDER_SITE_OTHER): Payer: Self-pay | Admitting: Family Medicine

## 2020-04-17 DIAGNOSIS — R0600 Dyspnea, unspecified: Secondary | ICD-10-CM

## 2020-04-17 MED ORDER — ALBUTEROL SULFATE 108 (90 BASE) MCG/ACT IN AEPB
2.00 | INHALATION_SPRAY | RESPIRATORY_TRACT | 1 refills | Status: DC | PRN
Start: 2020-04-17 — End: 2021-12-25

## 2020-04-17 NOTE — Telephone Encounter (Signed)
Outgoing call, patient informed and verbalized understanding of results and recommenedations. She had no questions at this time.     Requesting a refill for albuterol     Please advise

## 2020-04-17 NOTE — Telephone Encounter (Signed)
Refill sent to pharmacy.  Please follow-up with patient regarding seeing another pulmonologist. Last visit she was asking to see a pulmonologist closer to her home and I have her a few contacts.  Has she made appointment with either?

## 2020-04-17 NOTE — Telephone Encounter (Signed)
Pt. States she has had phone issues. Is asking for a call back with lab results from May. Please advise. Thank you!

## 2020-05-08 ENCOUNTER — Other Ambulatory Visit (INDEPENDENT_AMBULATORY_CARE_PROVIDER_SITE_OTHER): Payer: Self-pay | Admitting: Family Medicine

## 2020-05-09 ENCOUNTER — Other Ambulatory Visit (INDEPENDENT_AMBULATORY_CARE_PROVIDER_SITE_OTHER): Payer: Self-pay | Admitting: Family Medicine

## 2020-05-09 DIAGNOSIS — M109 Gout, unspecified: Secondary | ICD-10-CM

## 2020-05-13 ENCOUNTER — Ambulatory Visit
Admission: RE | Admit: 2020-05-13 | Discharge: 2020-05-13 | Disposition: A | Discharge status: Home / Self Care | Payer: Medicare Other | Source: Ambulatory Visit | Attending: Family Medicine | Admitting: Family Medicine

## 2020-05-13 DIAGNOSIS — Z780 Asymptomatic menopausal state: Secondary | ICD-10-CM | POA: Insufficient documentation

## 2020-05-15 ENCOUNTER — Encounter (INDEPENDENT_AMBULATORY_CARE_PROVIDER_SITE_OTHER): Payer: Self-pay | Admitting: Family Medicine

## 2020-05-15 DIAGNOSIS — M810 Age-related osteoporosis without current pathological fracture: Secondary | ICD-10-CM | POA: Insufficient documentation

## 2020-05-16 NOTE — Progress Notes (Signed)
Called and spoke with patient.  Read lab results reviewed the provider's note and recommendations. Patient verbalized understanding.  Other than that, patient had no other questions or concerns at this time.  Patient will call back office with any other questions or concerns and schedule appointment

## 2020-05-27 NOTE — Progress Notes (Signed)
Subjective:    Date: 05/28/2020 9:16 AM   Patient ID: Leslie Ray is a 85 y.o. female.    Chief Complaint   Patient presents with    Osteoporosis     HPI  Renal Function Trend:  Lab Results   Component Value Date    CREAT 1.1 03/14/2020    CREAT 1.0 12/14/2019    CREAT 1.0 08/08/2019    CREAT 1.3 02/12/2019       Lab Results   Component Value Date    EGFR 47.4 03/14/2020    EGFR 53.0 12/14/2019    EGFR 53.0 08/08/2019    EGFR 39.2 02/12/2019     Osteoporosis  Patient complains of osteoporosis. She was diagnosed with osteoporosis by bone density scan in 05/13/2020. Patient denies history of fracture.The cause of osteoporosis is felt to be due to postmenopausal estrogen deficiency.   She is currently being treated with calcium and vitamin D supplementation.  She is not currently being treated with bisphosphonates   Osteoporosis Risk Factors   Nonmodifiable  Personal Hx of fracture as an adult: no  Hx of fracture in first-degree relative: yes - mother - hip/ribs  Caucasian race: yes  Advanced age: yes  Female sex: yes  Dementia: no  Poor health/frailty: no     Potentially modifiable:  Tobacco use: no  Low body weight (<127 lbs): no  Estrogen deficiency     early menopause (age <45) or bilateral ovariectomy: no     prolonged premenopausal amenorrhea (>1 yr): no  Low calcium intake (lifelong): no  Alcoholism: no  Recurrent falls: no  Inadequate physical activity: yes; pt states she goes up and down steps at home several times a day, walks around the house    Current calcium and Vit D intake:  Dietary sources: occasional milk, yogurt, beans, oatmeal, greens  Supplements: yes    Patient has all dentures.     Pt has occasional back pain when vacuums or when first gets up.     Problem   Gerd (Gastroesophageal Reflux Disease)    Esomeprazole 20 mg , taking 1 tablet daily      Pt is asymptomatic on current medication  No known history of GI ulcer.       Magnesium   Date Value Ref Range Status   03/14/2020 1.8 1.6 - 2.6  mg/dL Final   16/07/9603 1.9 1.6 - 2.6 mg/dL Final     Vitamin V-40   Date Value Ref Range Status   03/14/2020 749 211 - 911 pg/mL Final   02/12/2019 788 211 - 911 pg/mL Final          Essential Hypertension    Med:  Amlodipine 10 mg   Toprol Xl 50 mg daily - increase to 100 mg 11/14/2019  Losartan 100 mg daily    Occasional shortness of breath when carrying heavy items.   Pt has long history of dizziness, however not recently  At end of day has very slight swelling in legs.       Pt is compliant with current medication. No adverse effects reported to medication and compliance is reported to be good.   Pt denies CP/chest discomfort, palpitations, syncope or near syncope, orthopnea, or PND.    Home BP:  Pt reports h as been checking on and off, did not bring log and does not recall measurements. Pt reports last she was checking it was 120s/70s.     Pt was in the ED on 03/22/2019 for  headache and CBC showed normal WBC and kidney function nml creat 1.0    Renal Function Trend:  Lab Results   Component Value Date    CREAT 1.0 12/14/2019    CREAT 1.0 08/08/2019    CREAT 1.3 02/12/2019       Lab Results   Component Value Date    EGFR 53.0 12/14/2019    EGFR 53.0 08/08/2019    EGFR 39.2 02/12/2019       Lab Results   Component Value Date    WBC 11.72 (H) 02/12/2019    HGB 11.3 (L) 02/12/2019    HCT 36.0 02/12/2019    MCV 93.8 02/12/2019    PLT 309 02/12/2019              Patient Active Problem List   Diagnosis    Gout    GERD (gastroesophageal reflux disease)    Hyperlipidemia    Essential hypertension    Allergic rhinitis    Mild intermittent asthma    Irregular bowel habits    Hypothyroid    Stage 3a chronic kidney disease    Counseling on health promotion and disease prevention    Osteoporosis     Patient Care Team:  Jules Husbands, DO as PCP - General (Family Medicine)  Loman Chroman, MD as Consulting Physician (Critical Care Medicine)    Immunization History   Administered Date(s) Administered     COVID-19 Ad26 Vaccine Preservative Free 0.5 mL (JANSSEN) 01/08/2020    Hepatitis B (Adult) 07/21/1992, 08/18/1992, 02/09/1993    Influenza (Im) Preservative Free 11/10/2012, 07/30/2013    Influenza (Im) Preserved TRIVALENT VACCINE 11/04/2005    Influenza Vacc QUAD Recombinant PF 80yrs & up 08/14/2019    Influenza quadrivalent (IM) 6 months & up PRESERVED 09/28/2018    Pneumococcal 23 valent 03/18/2002    Pneumococcal Conjugate 13-Valent 07/03/2015    Tdap 05/14/2019     Current Outpatient Medications   Medication Sig Dispense Refill    Albuterol Sulfate 108 (90 Base) MCG/ACT Aerosol Pwdr, Breath Activated Inhale 2 puffs into the lungs every 4 (four) hours as needed (shortness of breath) 1 each 1    allopurinol (ZYLOPRIM) 100 MG tablet TAKE ONE TABLET BY MOUTH DAILY 90 tablet 3    amLODIPine (NORVASC) 10 MG tablet TAKE ONE TABLET BY MOUTH DAILY 90 tablet 3    atorvastatin (LIPITOR) 40 MG tablet Take 1 tablet (40 mg total) by mouth daily 90 tablet 3    budesonide-formoterol (SYMBICORT) 160-4.5 MCG/ACT inhaler daily as needed      calcium-vitamin D (OSCAL 500/200 D-3) 500-200 MG-UNIT per tablet Take 1 tablet by mouth 2 (two) times daily 600mg  (1500mg ) - 200U tab         diclofenac Sodium (VOLTAREN) 1 % Gel topical gel Apply 4 g to affected area 4 times daily as needed.  Max: 16 g/joint/day.  Do not apply to broken, inflamed, or infected skin. 100 g 1    esomeprazole (NEXIUM) 20 MG capsule Take 20 mg by mouth every morning before breakfast      Loratadine (CLARITIN PO) Take 5 mg by mouth daily         losartan (COZAAR) 100 MG tablet Take 1 tablet (100 mg total) by mouth daily 90 tablet 3    meclizine (ANTIVERT) 12.5 MG tablet Take 25 mg by mouth every 8 (eight) hours as needed      metoprolol succinate (TOPROL-XL) 100 MG 24 hr tablet TAKE ONE TABLET BY MOUTH DAILY  90 tablet 3    montelukast (SINGULAIR) 10 MG tablet TAKE ONE TABLET BY MOUTH ONCE NIGHTLY 90 tablet 3    Omega-3 Fatty Acids (OMEGA-3  FISH OIL) 500 MG Cap Take 2 capsules by mouth 2 (two) times daily      Synthroid 75 MCG tablet TAKE ONE TABLET BY MOUTH DAILY 90 tablet 3     No current facility-administered medications for this visit.     Allergies   Allergen Reactions    Penicillins        Past Medical History:   Diagnosis Date    Allergies     Anemia     Arthritis     Asthma     Bowel habit changes     Chest pain     Disorder of thyroid     Gastroesophageal reflux disease     Gout     Hyperglycemia     Hyperlipidemia     Hypertension     Laxative habit     Murmur     Vertigo      Past Surgical History:   Procedure Laterality Date    BLADDER REPAIR  1999    CATARACT EXTRACTION, BILATERAL  2002    HYSTERECTOMY  1999    THYROIDECTOMY, PARTIAL  1989    tumor removed - patient states was not cancer       Family History   Problem Relation Age of Onset    Emphysema Mother         smoker    Heart attack Father 22    Emphysema Brother        Social History     Socioeconomic History    Marital status: Widowed     Spouse name: Not on file    Number of children: Not on file    Years of education: Not on file    Highest education level: Not on file   Occupational History    Not on file   Tobacco Use    Smoking status: Never Smoker    Smokeless tobacco: Never Used   Vaping Use    Vaping Use: Never used   Substance and Sexual Activity    Alcohol use: Never    Drug use: Never    Sexual activity: Not on file   Other Topics Concern    Not on file   Social History Narrative    Not on file     Social Determinants of Health     Financial Resource Strain:     Difficulty of Paying Living Expenses:    Food Insecurity:     Worried About Programme researcher, broadcasting/film/video in the Last Year:     Barista in the Last Year:    Transportation Needs:     Freight forwarder (Medical):     Lack of Transportation (Non-Medical):    Physical Activity:     Days of Exercise per Week:     Minutes of Exercise per Session:    Stress:     Feeling  of Stress :    Social Connections:     Frequency of Communication with Friends and Family:     Frequency of Social Gatherings with Friends and Family:     Attends Religious Services:     Active Member of Clubs or Organizations:     Attends Banker Meetings:     Marital Status:    Intimate Partner Violence:  Fear of Current or Ex-Partner:     Emotionally Abused:     Physically Abused:     Sexually Abused:      The following portions of the patient's history were reviewed and updated as appropriate: allergies, current medications, past family history, past medical history, past social history, past surgical history and problem list.    Review of Systems     Objective:   BP 153/72 (BP Site: Right arm, Patient Position: Sitting, Cuff Size: Medium)    Pulse 67    Temp 98.2 F (36.8 C) (Temporal)    Resp 14    Wt 64.3 kg (141 lb 12.8 oz)    BMI 26.36 kg/m   Wt Readings from Last 3 Encounters:   05/28/20 64.3 kg (141 lb 12.8 oz)   12/14/19 64.4 kg (142 lb)   11/14/19 64 kg (141 lb)     Physical Exam  Vitals reviewed.   Constitutional:       General: She is not in acute distress.     Appearance: She is not ill-appearing.   Neurological:      Mental Status: She is alert.          Assessment/Plan:       1. Osteoporosis, unspecified osteoporosis type, unspecified pathological fracture presence  Evaluate for CKD-MBD  If normal, then start bisphosphonate  Discussed alendronate with patient  Risk & Benefits of the new medication(s) were explained to the patient (and family) who verbalized understanding & agreed to the treatment plan. Patient (family) encouraged to contact me/clinical staff with any questions/concerns  - Phosphorus  - Vitamin D,25 OH, Total  - PTH, Intact  - Referral to Physical Therapy-[facility]  - Basic Metabolic Panel    2. Stage 3a chronic kidney disease  - Phosphorus  - Vitamin D,25 OH, Total  - PTH, Intact  - Referral to Physical Therapy-[facility]  - Basic Metabolic  Panel    3. Essential hypertension  BP elevated in office, however pt reports lower at home, advised to keep a more consistent log for review  - Basic Metabolic Panel    4. Gastroesophageal reflux disease, unspecified whether esophagitis present  Symptoms are controlled on esomeprazole. She has tried discontinuing this medication with recurrence of symptoms.   Long term use of PPIs can cause low magnesium and vitamin B12 levels and can be associated with chronic kidney disease, osteoporosis, dementia, pneumonia, and Clostridioides (formerly Clostridium) difficile infection, other enteric infections, and microscopic colitis    Ensure using calcium supplements that do not require acid for absorption, such as calcium citrate.    Keep followup currently scheduled ([facility])      Jules Husbands, DO

## 2020-05-28 ENCOUNTER — Encounter (INDEPENDENT_AMBULATORY_CARE_PROVIDER_SITE_OTHER): Payer: Self-pay | Admitting: Family Medicine

## 2020-05-28 ENCOUNTER — Ambulatory Visit (INDEPENDENT_AMBULATORY_CARE_PROVIDER_SITE_OTHER): Payer: Medicare Other | Admitting: Family Medicine

## 2020-05-28 VITALS — BP 153/72 | HR 67 | Temp 98.2°F | Resp 14 | Wt 141.8 lb

## 2020-05-28 DIAGNOSIS — N1831 Chronic kidney disease, stage 3a: Secondary | ICD-10-CM

## 2020-05-28 DIAGNOSIS — I10 Essential (primary) hypertension: Secondary | ICD-10-CM

## 2020-05-28 DIAGNOSIS — M810 Age-related osteoporosis without current pathological fracture: Secondary | ICD-10-CM

## 2020-05-28 DIAGNOSIS — K219 Gastro-esophageal reflux disease without esophagitis: Secondary | ICD-10-CM

## 2020-05-28 LAB — PTH, INTACT: PTH Intact: 49.8 pg/mL (ref 9.0–72.0)

## 2020-05-28 LAB — BASIC METABOLIC PANEL
Anion Gap: 11 (ref 5.0–15.0)
BUN: 15 mg/dL (ref 7.0–19.0)
CO2: 23 mEq/L (ref 21–29)
Calcium: 10.1 mg/dL (ref 7.9–10.2)
Chloride: 106 mEq/L (ref 100–111)
Creatinine: 1 mg/dL (ref 0.4–1.5)
Glucose: 92 mg/dL (ref 70–100)
Potassium: 4.2 mEq/L (ref 3.5–5.1)
Sodium: 140 mEq/L (ref 136–145)

## 2020-05-28 LAB — VITAMIN D,25 OH,TOTAL: Vitamin D, 25 OH, Total: 56 ng/mL (ref 30–100)

## 2020-05-28 LAB — HEMOLYSIS INDEX: Hemolysis Index: 6 (ref 0–18)

## 2020-05-28 LAB — GFR: EGFR: 52.9

## 2020-05-28 LAB — PHOSPHORUS: Phosphorus: 3.8 mg/dL (ref 2.3–4.7)

## 2020-05-28 NOTE — Patient Instructions (Signed)
Preventing Osteoporosis: Meeting Your Calcium Needs    Your body needs calcium to build and repair bones. But it can't make calcium on its own. That's why it's important to eat calcium-rich foods. Some foods are naturally rich in calcium. Others have calcium added (fortified). It's best to get calcium from the foods you eat. But if you can't get enough, you may want to take calcium supplements. To meet your daily calcium needs, try the foods listed below.  Dairy Fish & beans Other sources   Source  Calcium (mg) per serving  Source  Calcium (mg) per serving  Source  Calcium (mg) per serving   Low-fat yogurt, plain  415 mg/8 oz.  Sardines, Atlantic, canned, with bones  351 mg/3 oz.  Oatmeal, instant, fortified  215 mg/1 cup   Nonfat milk  302 mg/1 cup  Salmon, sockeye, canned, with bones  239 mg/3 oz.  Tofu made with calcium sulfate  204 mg/3 oz.   Low-fat milk  297 mg/1 cup  Soybeans, fresh, boiled  131 mg/1/2 cup  Collards  179 mg/1/2 cup   Swiss cheese  272 mg/1 oz.  White beans, cooked  81 mg/1/2 cup  English muffin, whole wheat  175 mg/1 muffin   Cheddar cheese  205 mg/1 oz.  Navy beans, cooked  79 mg/1/2 cup  Kale  90 mg/1/2 cup   Ice cream strawberry  79 mg/1/2 cup      Orange, navel  56 mg/1 medium   Note: Calcium levels may vary depending on brand and size.  Daily calcium needs  14 to 83 years old: 1,300 mg  19to30 years old: 1,000 mg  31to50 years old: 1,000 mg  51to70 years old, women: 1,200 mg  51to70 years old, men: 1,000 mg  Pregnant or nursing: 14to18 years old: 1,300 mg, 19to50 years old: 1,000 mg  Older than 70 (women and men): 1,200 mg   [employer] last reviewed this educational content on 02/22/2017   2000-2021 [employer]. All rights reserved. This information is not intended as a substitute for professional medical care. Always follow your healthcare professional's instructions.

## 2020-05-28 NOTE — Progress Notes (Signed)
Have you seen any specialists/other providers since your last visit with [place]?    No    Arm preference verified?   Yes    The patient is due for Advance Directive, [facility] care plan letter, Medicare Annual Wellness, Shingrix, Fall risk,Influenza vaccine

## 2020-05-29 ENCOUNTER — Other Ambulatory Visit (INDEPENDENT_AMBULATORY_CARE_PROVIDER_SITE_OTHER): Payer: Self-pay | Admitting: Family Medicine

## 2020-05-29 DIAGNOSIS — M810 Age-related osteoporosis without current pathological fracture: Secondary | ICD-10-CM

## 2020-05-29 MED ORDER — ALENDRONATE SODIUM 70 MG PO TABS
70.0000 mg | ORAL_TABLET | ORAL | 3 refills | Status: DC
Start: 2020-05-29 — End: 2021-04-29

## 2020-07-18 ENCOUNTER — Other Ambulatory Visit (INDEPENDENT_AMBULATORY_CARE_PROVIDER_SITE_OTHER): Payer: Self-pay | Admitting: Family Medicine

## 2020-09-15 ENCOUNTER — Encounter (INDEPENDENT_AMBULATORY_CARE_PROVIDER_SITE_OTHER): Payer: Self-pay | Admitting: Family Medicine

## 2020-09-15 ENCOUNTER — Ambulatory Visit (INDEPENDENT_AMBULATORY_CARE_PROVIDER_SITE_OTHER): Payer: Medicare Other | Admitting: Family Medicine

## 2020-09-15 VITALS — BP 163/77 | HR 69 | Temp 98.0°F | Resp 14 | Ht 61.5 in | Wt 139.2 lb

## 2020-09-15 DIAGNOSIS — J42 Unspecified chronic bronchitis: Secondary | ICD-10-CM

## 2020-09-15 DIAGNOSIS — Z0000 Encounter for general adult medical examination without abnormal findings: Secondary | ICD-10-CM

## 2020-09-15 DIAGNOSIS — M109 Gout, unspecified: Secondary | ICD-10-CM

## 2020-09-15 DIAGNOSIS — Z23 Encounter for immunization: Secondary | ICD-10-CM

## 2020-09-15 DIAGNOSIS — K219 Gastro-esophageal reflux disease without esophagitis: Secondary | ICD-10-CM

## 2020-09-15 DIAGNOSIS — I10 Essential (primary) hypertension: Secondary | ICD-10-CM

## 2020-09-15 DIAGNOSIS — Z0110 Encounter for examination of ears and hearing without abnormal findings: Secondary | ICD-10-CM

## 2020-09-15 DIAGNOSIS — E785 Hyperlipidemia, unspecified: Secondary | ICD-10-CM

## 2020-09-15 DIAGNOSIS — Z79899 Other long term (current) drug therapy: Secondary | ICD-10-CM

## 2020-09-15 DIAGNOSIS — J209 Acute bronchitis, unspecified: Secondary | ICD-10-CM

## 2020-09-15 DIAGNOSIS — E039 Hypothyroidism, unspecified: Secondary | ICD-10-CM

## 2020-09-15 DIAGNOSIS — J309 Allergic rhinitis, unspecified: Secondary | ICD-10-CM

## 2020-09-15 MED ORDER — PREDNISONE 20 MG PO TABS
40.0000 mg | ORAL_TABLET | Freq: Every day | ORAL | 0 refills | Status: AC
Start: 2020-09-15 — End: 2020-09-20

## 2020-09-15 MED ORDER — BUDESONIDE-FORMOTEROL FUMARATE 160-4.5 MCG/ACT IN AERO
2.0000 | INHALATION_SPRAY | Freq: Two times a day (BID) | RESPIRATORY_TRACT | 2 refills | Status: DC
Start: 2020-09-15 — End: 2022-06-21

## 2020-09-15 MED ORDER — FLUTICASONE PROPIONATE 50 MCG/ACT NA SUSP
2.0000 | Freq: Every day | NASAL | 3 refills | Status: DC
Start: 2020-09-15 — End: 2021-09-16

## 2020-09-15 MED ORDER — AZITHROMYCIN 250 MG PO TABS
ORAL_TABLET | ORAL | 0 refills | Status: AC
Start: 2020-09-15 — End: 2020-09-21

## 2020-09-15 NOTE — Progress Notes (Signed)
[facility] [place]            Leslie Ray is a 85 y.o. female who presents today for the following Medicare Wellness Visit:  []  Initial Preventive Physical Exam (IPPE) - "Welcome to Medicare" preventive visit (Vision Screening required)   []  Annual Wellness Visit - Initial  [x]  Annual Wellness Visit - Subsequent     Health Risk Assessment:   During the past month, how would you rate your general health?:  Good  Which of the following tasks can you do without assistance  drive or take the bus alone; shop for groceries or clothes; prepare your own meals; do your own housework/laundry; handle your own finances/pay bills; eat, bathe or get around your home?: Drive or take the bus alone,Handle your own finances/pay bills,Shop for groceries or clothes,Eat, bathe, dress or get around your home,Prepare your own meals,Do your own housework/laundry  Which of the following problems have you been bothered by in the past month  dizzy when standing up; problems using the phone; feeling tired or fatigued; moderate or severe body pain?: Feeling tired or fatigued (sometimes)  Do you exercise for about 20 minutes 3 or more days per week?:Yes  During the past month was someone available to help if you needed and wanted help?  For example, if you felt nervous, lonely, got sick and had to stay in bed, needed someone to talk to, needed help with daily chores or needed help just taking care of yourself.: Yes  Do you always wear a seat belt?: Yes  Do you have any trouble taking medications the way you have been told to take them?: No  Have you been given any information that can help you with keeping track of your medications?: Yes  Do you have trouble paying for your medications?: No  Have you been given any information that can help you with hazards in your house, such as scatter rugs, furniture, etc?: No  Do you feel unsteady when standing or walking?: Yes (sometimes)  Do you worry about falling?:  No  Have you fallen two or more times in the past year?: No  Did you suffer any injuries from your falls in the past year?: No     Care Team:   Patient Care Team:  Jules Husbands, DO as PCP - General (Family Medicine)  Loman Chroman, MD as Consulting Physician (Critical Care Medicine)  [employer] as Consulting Physician (Optometry)      Hospitalizations:   Hospitalization within past year: [x]  No  []  Yes     Diagnosis:      Screenings:   Ambulatory Screenings 03/30/2019 05/28/2020 09/15/2020   Falls Risk: [place] more than 2 times in past year N N N   Falls Risk: Suffer any injuries? N N N        Substance Use Disorder Screen:  In the past year, how often have you used the following?  1) Alcohol (For men, 5 or more drinks a day. For women, 4 or more drinks a day)  [x]  Never []  Once or Twice []  Monthly []  Weekly []  Daily or Almost Daily  2) Tobacco Products  [x]  Never []  Once or Twice []  Monthly []  Weekly []  Daily or Almost Daily  3) Prescription Drugs for Non-Medical Reasons  [x]  Never []  Once or Twice []  Monthly []  Weekly []  Daily or Almost Daily  4) Illegal Drugs  [x]  Never []  Once or Twice []  Monthly []  Weekly []   Daily or Almost Daily             Functional Ability/Level of Safety:   Falls Risk/Home Safety Assessment:  ( see HRA and Screenings sections for additional assessment)  Home Safety: [x]  Stair handrails  []  Skid-resistant rugs/remove throw rugs   [x]  Grab bars  [x]  Clear pathways between rooms  []  Proper lighting stairs/ bathrooms/bedrooms  Patient declined home health referral for home safety assessment    Get Up and Go (optional):  [x]   <20 secs  []   >20 secs    []   High risk for falls - Home Safety/Falls Risk Precautions reviewed with pt/family    Hearing Assessment:  Concerns for hearing loss: [x]  Yes  []   No  Hearing aids:   []   Right  []   Left  []   Bilateral   []   None  Whisper Test (optional):  []  Normal  []   Slightly decreased  []   Significantly decreased    Exercise:  Frequency:  [x]   No  formal exercise  []   1-2x/wk  []   3-4x/wk  []   >4x/wk  Duration:  []   15-30 mins/day  []   30-45 mins/day  []   45+ mins/day  Intensity:  []   Light  []   Moderate  []   Heavy  Does housework        Activities of Daily Living:   ADL's Independent Minimal  Assistance Moderate  Assistance Total   Assistance   Bathing [x]  []  []  []    Dressing [x]  []  []  []    Mobility   [x]  []  []  []    Transfer [x]  []  []  []    Eating [x]  []  []  []    Toileting [x]  []  []  []      IADL's Independent Minimal  Assistance Moderate  Assistance Total   Assistance   Phone [x]  []  []  []    Housekeeping [x]  []  []  []    Laundry [x]  []  []  []    Transportation []  [x] Pt does not drive []  []    Medications [x]  []  []  []    Finances [x]  []  []  []       ADL assistance: []  No assistance needed  []  Spouse  []  Sibling  []  Son   []  Daughter [x]  Children  []  Home Health Aide []  Other:       Advance Care Planning:   Discussion of Advance Directives:   []  Advance Directive in chart  [x]  Advance Directive not in chart - requested to provide []  No Advance Directive.  Form Provided  []  No Advance Directive.  Pt declines. []  Not addressed today  []  Other:     Exam:   BP 163/77 (BP Site: Right arm, Patient Position: Sitting, Cuff Size: Medium)    Pulse 69    Temp 98 F (36.7 C)    Resp 14    Ht 1.562 m (5' 1.5")    Wt 63.1 kg (139 lb 3.2 oz)    SpO2 97% Comment: room air   BMI 25.88 kg/m      Physical Exam  Vitals and nursing note reviewed.   Constitutional:       General: She is not in acute distress.  HENT:      Right Ear: Tympanic membrane normal.      Left Ear: Tympanic membrane normal.      Nose:      Right Sinus: No maxillary sinus tenderness or frontal sinus tenderness.      Left Sinus: No maxillary sinus tenderness or frontal sinus tenderness.  Mouth/Throat:      Mouth: Mucous membranes are moist.      Pharynx: Oropharynx is clear. Uvula midline. No pharyngeal swelling, oropharyngeal exudate, posterior oropharyngeal erythema or uvula swelling.      Tonsils: No tonsillar  exudate.   Eyes:      Conjunctiva/sclera: Conjunctivae normal.   Cardiovascular:      Rate and Rhythm: Normal rate and regular rhythm.      Heart sounds: S1 normal and S2 normal.   Pulmonary:      Effort: Pulmonary effort is normal. No accessory muscle usage or respiratory distress.      Breath sounds: Examination of the right-upper field reveals rhonchi. Examination of the left-upper field reveals rhonchi. Examination of the right-middle field reveals rhonchi. Examination of the left-middle field reveals rhonchi. Examination of the right-lower field reveals rhonchi. Examination of the left-lower field reveals rhonchi. Wheezing and rhonchi present. No decreased breath sounds or rales.   Chest:   Breasts:      Right: No supraclavicular adenopathy.      Left: No supraclavicular adenopathy.       Musculoskeletal:      Cervical back: Neck supple.   Lymphadenopathy:      Head:      Right side of head: No submental, submandibular or tonsillar adenopathy.      Left side of head: No submental, submandibular or tonsillar adenopathy.      Cervical: No cervical adenopathy.      Upper Body:      Right upper body: No supraclavicular adenopathy.      Left upper body: No supraclavicular adenopathy.   Skin:     Findings: No rash.               Evaluation of Cognitive Function:   Mood/affect: [x]  Appropriate  []   Other:   Appearance: [x]  Neatly groomed  [x]  Adequately nourished  []  Other:  Family member/caregiver input: []  Present - no concerns  [x]   Not present in room  []  Present - concerns:    Cognitive Assessment:  Mini-Cog Result (three word registration- banana, sunrise, chair / clock drawing):   []   > 3 points - negative screen for dementia   []  3 recalled words - negative screen for dementia   [x]  1-2 recalled words and normal clock draw - negative for cognitive impairment   []  1-2 recalled words and abnormal clock draw - positive for cognitive impairment   []  0 recalled words - positive for cognitive impairment          Assessment/Plan:   1. Medicare annual wellness visit, subsequent  - Flu vaccine HIGH-DOSE QUAD (PF) 65 yrs and older    2. Hypothyroidism, unspecified type  - TSH; Future    3. Hyperlipidemia, unspecified hyperlipidemia type  - Lipid panel; Future  - Comprehensive metabolic panel; Future    4. Essential hypertension    5. Encounter for hearing examination, unspecified whether abnormal findings  - Ambulatory referral to ENT    6. Gout of foot, unspecified cause, unspecified chronicity, unspecified laterality  - Uric acid; Future    7. Gastroesophageal reflux disease, unspecified whether esophagitis present  - Vitamin B12; Future  - Magnesium; Future    8. Medication management  - Vitamin B12; Future  - Magnesium; Future    9. Acute bronchitis, unspecified organism  - predniSONE (DELTASONE) 20 MG tablet; Take 2 tablets (40 mg total) by mouth daily for 5 days  Dispense: 10 tablet; Refill: 0  -  azithromycin (Zithromax Z-Pak) 250 MG tablet; Take 500 mg on day 1, followed by 250 mg days 2-5  Dispense: 6 tablet; Refill: 0    10. Allergic rhinitis, unspecified seasonality, unspecified trigger  - fluticasone (Flonase) 50 MCG/ACT nasal spray; 2 sprays by Nasal route daily  Dispense: 16 g; Refill: 3    11. Chronic bronchitis, unspecified chronic bronchitis type  - budesonide-formoterol (SYMBICORT) 160-4.5 MCG/ACT inhaler; Inhale 2 puffs into the lungs 2 (two) times daily  Dispense: 10.2 g; Refill: 2      Education/Counseling:  During the course of the visit, the patient was educated and counseled about appropriate screening and preventive services including:   Influenza vaccine  Shingrix vaccine  COVID vaccine  Glaucoma screening  Nutrition counseling  Physical activity/exercise counseling  Heart healthy diet counseling  Falls risk prevention and home safety      Falls Risk/Home Safety:  The following falls risk/home safety measures were discussed with the patient:  [x]  Home Environment - 1) clear floors, walkways, and  stairs of items 2) Remove throw rugs or use non-skid rugs 3) Install grab bars in bathroom 4) Use of handrails with stairs 5) Proper lighting of hallways, bathroom, stairs, bedroom  [x]  Physical Activity - age appropriate activity/exercise 150 mins per week.  [x]  Nutrition - adequate hydration, limit soda, coffee, alcohol   [x]  Vision - yearly eye exam, keep glasses clean  [x]  Medications - side effect education        Advanced Care Planning:  The following were discussed with the patient/family:  []  Explanation of advance directives        References:  [employer] on Aging - [email or web address]       Jules Husbands, DO    09/15/2020    The following sections were reviewed this encounter by the provider:   Tobacco   Allergies   Meds   Med Hx   Surg Hx   Fam Hx   Soc Hx         History:   Patient Active Problem List   Diagnosis    Gout    GERD (gastroesophageal reflux disease)    Hyperlipidemia    Essential hypertension    Allergic rhinitis    Mild intermittent asthma    Irregular bowel habits    Hypothyroid    Stage 3a chronic kidney disease    Counseling on health promotion and disease prevention    Osteoporosis      Past Medical History:   Diagnosis Date    Allergies     Anemia     Arthritis     Asthma     Bowel habit changes     Chest pain     Disorder of thyroid     Gastroesophageal reflux disease     Gout     Hyperglycemia     Hyperlipidemia     Hypertension     Laxative habit     Murmur     Vertigo      Past Surgical History:   Procedure Laterality Date    BLADDER REPAIR  1999    CATARACT EXTRACTION, BILATERAL  2002    HYSTERECTOMY  1999    THYROIDECTOMY, PARTIAL  1989    tumor removed - patient states was not cancer     Allergies   Allergen Reactions    Penicillins       Outpatient Medications Marked as Taking for the 09/15/20 encounter (Office Visit) with Jules Husbands, DO  Medication Sig Dispense Refill     Albuterol Sulfate 108 (90 Base) MCG/ACT Aerosol Pwdr, Breath Activated Inhale 2 puffs into the lungs every 4 (four) hours as needed (shortness of breath) 1 each 1    alendronate (FOSAMAX) 70 MG tablet Take 1 tablet (70 mg total) by mouth once a week Take 1 tab by mouth every 7 days with a full glass of water on an empty stomach. Do not take anything else by mouth or lie down for 30 mins. 12 tablet 3    allopurinol (ZYLOPRIM) 100 MG tablet TAKE ONE TABLET BY MOUTH DAILY 90 tablet 3    amLODIPine (NORVASC) 10 MG tablet TAKE ONE TABLET BY MOUTH DAILY 90 tablet 3    atorvastatin (LIPITOR) 40 MG tablet TAKE ONE TABLET BY MOUTH DAILY 90 tablet 3    budesonide-formoterol (SYMBICORT) 160-4.5 MCG/ACT inhaler Inhale 2 puffs into the lungs 2 (two) times daily 10.2 g 2    calcium-vitamin D (OSCAL 500/200 D-3) 500-200 MG-UNIT per tablet Take 1 tablet by mouth 2 (two) times daily 600mg  (1500mg ) - 200U tab         diclofenac Sodium (VOLTAREN) 1 % Gel topical gel Apply 4 g to affected area 4 times daily as needed.  Max: 16 g/joint/day.  Do not apply to broken, inflamed, or infected skin. 100 g 1    esomeprazole (NEXIUM) 20 MG capsule Take 20 mg by mouth every morning before breakfast      Loratadine (CLARITIN PO) Take 5 mg by mouth daily         losartan (COZAAR) 100 MG tablet Take 1 tablet (100 mg total) by mouth daily 90 tablet 3    meclizine (ANTIVERT) 12.5 MG tablet Take 25 mg by mouth every 8 (eight) hours as needed      metoprolol succinate (TOPROL-XL) 100 MG 24 hr tablet TAKE ONE TABLET BY MOUTH DAILY 90 tablet 3    montelukast (SINGULAIR) 10 MG tablet TAKE ONE TABLET BY MOUTH ONCE NIGHTLY 90 tablet 3    Omega-3 Fatty Acids (OMEGA-3 FISH OIL) 500 MG Cap Take 2 capsules by mouth 2 (two) times daily      Synthroid 75 MCG tablet TAKE ONE TABLET BY MOUTH DAILY 90 tablet 3    [DISCONTINUED] budesonide-formoterol (SYMBICORT) 160-4.5 MCG/ACT inhaler daily as needed       Social History     Tobacco Use     Smoking status: Never Smoker    Smokeless tobacco: Never Used   Vaping Use    Vaping Use: Never used   Substance Use Topics    Alcohol use: Never    Drug use: Never      Family History   Problem Relation Age of Onset    Emphysema Mother         smoker    Heart attack Father 19    Emphysema Brother            ===================================================================    Additional Documentation:     Chief Complaint:   Coughing, wheezing, sneezing.         HPI:   Pt c/o Coughing, wheezing, sneezing x months, however worse over the past month.     Pt states she is overdue for pulmonary followup and was running low on symbicort, so she was taking less than prescribed. Has been taking less symbicort than prescribed due to running out and well overdue for follow-up with pulmonary. Using albuterol 2-3 times a day for the past few weeks.  Denies fever/chills.  Pt reports very little phlegm.           ROS:             Physical Exam:   See above        Assessment/Plan:   1. Medicare annual wellness visit, subsequent  - Flu vaccine HIGH-DOSE QUAD (PF) 65 yrs and older    2. Hypothyroidism, unspecified type  - TSH; Future    3. Hyperlipidemia, unspecified hyperlipidemia type  - Lipid panel; Future  - Comprehensive metabolic panel; Future    4. Essential hypertension    5. Encounter for hearing examination, unspecified whether abnormal findings  - Ambulatory referral to ENT    6. Gout of foot, unspecified cause, unspecified chronicity, unspecified laterality  - Uric acid; Future    7. Gastroesophageal reflux disease, unspecified whether esophagitis present  - Vitamin B12; Future  - Magnesium; Future    8. Medication management  - Vitamin B12; Future  - Magnesium; Future    9. Acute bronchitis, unspecified organism  - predniSONE (DELTASONE) 20 MG tablet; Take 2 tablets (40 mg total) by mouth daily for 5 days  Dispense: 10 tablet; Refill: 0  - azithromycin (Zithromax Z-Pak) 250 MG tablet; Take 500 mg on day 1,  followed by 250 mg days 2-5  Dispense: 6 tablet; Refill: 0    10. Allergic rhinitis, unspecified seasonality, unspecified trigger  - fluticasone (Flonase) 50 MCG/ACT nasal spray; 2 sprays by Nasal route daily  Dispense: 16 g; Refill: 3    11. Chronic bronchitis, unspecified chronic bronchitis type  - budesonide-formoterol (SYMBICORT) 160-4.5 MCG/ACT inhaler; Inhale 2 puffs into the lungs 2 (two) times daily  Dispense: 10.2 g; Refill: 2    Schedule follow-up with pulmonary Dr. Illene Bolus            Follow-up:   Return in about 6 months (around 03/15/2021) for chronic disease management, Return sooner as needed.       Jules Husbands, DO

## 2020-09-15 NOTE — Progress Notes (Signed)
Have you seen any specialists/other providers since your last visit with [place]?    No    Arm preference verified?   Yes    The patient is due for Advance directive, [facility], [employer],

## 2020-09-15 NOTE — Patient Instructions (Signed)
MEDICARE WELLNESS PERSONAL PREVENTION PLAN   As part of the Medicare Wellness portion of your visit today, we are providing you with this personalized preventative plan of care. We have listed below some of the preventative services that are recommended for patients based upon their age and gender. These recommendations are taken directly from the [place] [employer] (USPSTF) and the [employer] (ACIP).     Health Maintenance   Topic Date Due    Advance Directive on File  Never done    PCMH CARE PLAN LETTER  Never done    Shingrix Vaccine 50+ (2) 05/13/2002    INFLUENZA VACCINE  05/25/2020    DEPRESSION SCREENING  11/13/2020    FALLS RISK ANNUAL  05/28/2021    Medicare Annual Wellness Visit  09/15/2021    DXA Scan  Completed    COVID-19 Vaccine  Completed    Pneumonia Vaccine Age 55+  Completed       Health Maintenance Topics with due status: Overdue       Topic Date Due    Advance Directive on File Never done    PCMH CARE PLAN LETTER Never done    Shingrix Vaccine 50+ 05/13/2002    INFLUENZA VACCINE 05/25/2020        Immunization History   Administered Date(s) Administered    COVID-19 Ad26 Vaccine Preservative Free 0.5 mL (JANSSEN) 01/08/2020    Hepatitis B (Adult) 07/21/1992, 08/18/1992, 02/09/1993    Influenza (Im) Preservative Free 11/10/2012, 07/30/2013    Influenza (Im) Preserved TRIVALENT VACCINE 11/04/2005    Influenza Vacc QUAD Recombinant PF 44yrs & up 08/14/2019    Influenza quadrivalent (IM) 6 months & up PRESERVED (Afluria/Fluzone) 09/28/2018    Pneumococcal 23 valent 03/18/2002    Pneumococcal Conjugate 13-Valent 07/03/2015    Tdap 05/14/2019        Your major risk factors: Hypertension, Osteoporosis, Asthma, Falls Risk and Sedentary lifestyle  Recommendations for improvement:  Elevated Cholesterol/Low Salt Diet - Optimize adherence with low cholesterol diet.  Here is a good reference website for tips on healthy eating and  ways to lower your cholesterol -  (NoCareers.gl)  Exercise - Engage in age appropriate exercise 150 minutes per week   Osteoporosis - Weight bearing exercises (walking, yoga, Tai-Chi) 150 minutes per week.  Here is a good reference for tips on engaging in weight bearing exercise - https://www.bones.ToysManual.co.za  Hypertension - Monitor your blood pressure daily. Here is a good reference website for tips on managing hypertension - MotivationalSites.no  Falls Prevention - Here is a good reference website for tips on avoiding falls - BikingRewards.pl  Compliance with prescription medications  Make sure that you follow-up with your specialists on a regular basis    The list below includes many common screening recommendations but is not meant to be comprehensive. You may be eligible for other preventative services depending upon your personal risk factors.    Colorectal Cancer Screening  All adults age 39-75 should undergo periodic colorectal cancer screening. The decision to screen for colorectal cancer in adults aged 53 to 68 years should be an individual one,taking into account your overall health and prior screening history.    Breast Cancer Screening  Women age 10-74 should have mammograms every other year (please note that this recommendation may not be appropriate for every woman - your physician can answer specific questions you may have). The USPSTF concludes that the current evidence is insufficient to assess the balance of benefits and  harms of screening mammography in women aged 46 years or older.     Cervical Cancer Screening  Women over 73 do not require pap smears as long as prior screening has been normal and are not otherwise at high risk for cervical cancer. For women aged 73 to 17 years, the USPSTF recommends  screening every 3 years with cervical cytology alone, every 5 years with high-risk human papillomavirus (hrHPV) testing alone, or every 5 years with hrHPV testing in combination with cytology (cotesting).    Osteoporosis Screening   The USPSTF recommends screening for osteoporosis with bone measurement testing to prevent osteoporotic fractures in women 65 years and older.   Hepatitis C Screening  Recommend screening for hepatitis C virus (HCV) infection in all adults aged 68 to 95 years.   Lung cancer Screening - Recommend annual screening for lung cancer with low-dose computed tomography (LDCT) in adults ages 86 to 79 years who have a 20 pack-year smoking history and currently smoke or have quit within the past 15 years.   Recommended Vaccinations    Influenza one dose annually    Tetanus/diphtheria one booster every 10 years    Zoster/Shingles - Shingrix two doses after age 71 (second dose given 2-6 months after first dose)   Pneumovax (PPSV23) one dose for adults aged ?65 years   Prevnar(PCV13) shared clinical decision-making is recommended regarding administration of this vaccine to persons aged ?65 years who do not have an immunocompromising condition, cerebrospinal fluid leak, or cochlear implant.     PERSONAL PREVENTION PLAN   Your Personal Prevention Plan is based on your overall health and your responses to the health questionnaire you completed. The following information is for you to review in addition to the recommendations, referrals, and tests we have discussed at your visit.     Physical Activity:   Physical activity can help you maintain a healthy weight, prevent or control illness, reduce stress, and sleep better. It can also help you improve your balance to avoid falls. Try to build up to and maintain a total of 30 minutes of activity each day. If you are able, try walking, doing yard or housework, and taking the stairs more often. You can also strengthen your muscles with exercises done  while sitting or lying down.   Emotional Health:   Feeling down in the dumps or anxious every now and then is a natural part of life. If this feeling lasts for a few weeks or more, talk with me as soon as possible. It could be a sign of a problem that needs treatment. There are many types of treatment available.   Falls:   You can reduce your risk of falling by making changes in your home. Remove items that may cause tripping, improve lighting, and consider installing grab bars.   Talk with me if you have problems with balance and walking. To prevent falls, you may need your vision, hearing, or blood pressure checked. Exercises to improve your strength and balance, or using a cane or walker, may help. Review your medicines with me at every visit, because some can affect balance. Please be sure to let me know if you fall or are fearful you may fall.   Urinary Leakage:   Urine leakage is common, but it is not a normal part of aging. Talk with me about any urine leakage so that the cause can be found and treated. Treatment can include bladder training, exercises, medicine or surgery.   Pain:  We all have aches and pains at times, but chronic pain can change how you feel and live every day. Please talk with me about any symptoms of chronic pain so that we can determine how best to treat.   Sleep:   Getting a good nights sleep is vital to your health and well-being and can help prevent or manage health problems. Often, sleep can be improved by changing behaviors, including when you go to bed and what you do before bed. Sleep apnea can cause problems such as struggling to stay awake during the day. Please let me know if you would like to learn more about improving your sleep and/or think you may have sleep apnea.   Seat Belt:   Please remember to wear a seat belt when driving or riding in a vehicle. It is one of the most important things you can do to stay safe in a car.   Nutrition:   Remember to eat plenty of  fruits, vegetables, whole grains, and dairy. Drink at least 64 ounces (8 full glasses) of water a day, unless you have been advised to limit fluids.   Alcohol:   Alcohol can have a greater effect on older people, who may feel its effects at a lower amount. Older people should limit alcoholic drinks (no more than one a day for women and no more than two a day for men). Please let me know if alcohol use becomes a problem.   Tobacco:   Not smoking or using other forms of tobacco is one of the most important things you can do for your health. Here is some more information about the importance of quitting smoking and how to quit smoking - BroadJournal.com.pt  Advance Directives:   There may come a time when medical decisions need to be made on your behalf. Please talk with your family, and with me, about your wishes. It is important to provide information about your decisions, and any formal advance directives, for your medical record. Here is additional information on advanced directives - http://wilson-mayo.[email or web address]  Additional Support:   Sometimes it can be challenging to manage all aspects of daily life. Finding the right support can help you maintain or improve your health and independence. Please let me know if you would like to talk further about finding resources to assist you.

## 2020-09-25 ENCOUNTER — Ambulatory Visit (INDEPENDENT_AMBULATORY_CARE_PROVIDER_SITE_OTHER): Payer: Medicare Other

## 2020-09-25 DIAGNOSIS — Z23 Encounter for immunization: Secondary | ICD-10-CM

## 2020-09-25 NOTE — Progress Notes (Signed)
COVID-19 Vaccination:  Leslie Ray   05-14-1935  09/25/2020     Patient presented to the office for the following COVID-19 vaccine administration:      [x]  Pfizer-BioNTech  []  Moderna     []  Dose #1   []  Dose #2      (Both doses of the series should be completed)     []  Additional 3rd Dose (Pt must have completed initial Pfizer/Moderna series at least 28 days ago. Authorized for persons with solid organ transplants, or diagnosed with conditions considered to have an equivalent level of moderate/severe immunocompromise)    [x]  Booster (18+ yrs - Pt must have completed Pfizer/Moderna series at least 6 months ago or completed J+J vaccination at least 2 months ago)     []  Jansen/J+J        []  Dose #1       []  Booster (18+ yrs - Pt must have completed J+J vaccination at least 2 months ago or completed Pfizer/Moderna series at least 6 months ago)          [x]  A verbal consent has been obtained from patient to administer COVID-19 vaccine that is currently being offered under Emergency Use Authorization (EUA). Patient/Caregiver has been provided a copy of EUA.      [x]  Pt was screened for precautions and contraindications to vaccine  []  24-28+ day interval observed between first and second dose (Moderna)  []  17-21+ day interval observed between first and second dose (Pfizer-BioNTech)  []  28+ day interval observed between additional third dose and initial Pfizer-BioNTech / Moderna series)  []  > 6 months interval observed between booster and initial Pfizer-BioNTech / Moderna series)  [x]  > 2 months interval observed between initial Jansen/J+J single dose)      COVID-19 Post-Vaccination Observation:  Leslie Ray   May 20, 1935  09/25/2020     Pt was observed post-vaccination for the occurrence of immediate adverse reactions:   [x]  15 minutes: No prior history of significant allergic reaction to vaccine or injectable therapy or history of anaphylaxis due to any cause   []  30 minutes: Prior history of an immediate allergic  reaction of any severity to a vaccine or injectable therapy (urticaria, angioedema, respiratory distress (e.g., wheezing, stridor))  []  30 minutes: Prior history of anaphylaxis due to any cause     Post-Vaccination Observation:  [x]  No reaction was noted and patient left in good condition.  []  Arm Soreness  []  Erythema  []  Hives  []  Other:    2nd Dose Instructions:  []  Pt instructed to schedule 2nd dose at front desk before leaving facility

## 2020-10-06 ENCOUNTER — Encounter (INDEPENDENT_AMBULATORY_CARE_PROVIDER_SITE_OTHER): Payer: Self-pay | Admitting: Family Medicine

## 2021-01-15 ENCOUNTER — Other Ambulatory Visit (INDEPENDENT_AMBULATORY_CARE_PROVIDER_SITE_OTHER): Payer: Self-pay | Admitting: Family Medicine

## 2021-01-23 ENCOUNTER — Other Ambulatory Visit (INDEPENDENT_AMBULATORY_CARE_PROVIDER_SITE_OTHER): Payer: Self-pay | Admitting: Family Medicine

## 2021-01-31 ENCOUNTER — Other Ambulatory Visit (INDEPENDENT_AMBULATORY_CARE_PROVIDER_SITE_OTHER): Payer: Self-pay | Admitting: Family Medicine

## 2021-01-31 DIAGNOSIS — I10 Essential (primary) hypertension: Secondary | ICD-10-CM

## 2021-03-05 ENCOUNTER — Other Ambulatory Visit (INDEPENDENT_AMBULATORY_CARE_PROVIDER_SITE_OTHER): Payer: Self-pay | Admitting: Family Medicine

## 2021-03-16 ENCOUNTER — Encounter (INDEPENDENT_AMBULATORY_CARE_PROVIDER_SITE_OTHER): Payer: Self-pay | Admitting: Family Medicine

## 2021-03-16 ENCOUNTER — Ambulatory Visit (INDEPENDENT_AMBULATORY_CARE_PROVIDER_SITE_OTHER): Payer: Medicare Other | Admitting: Family Medicine

## 2021-03-16 VITALS — BP 160/63 | HR 79 | Temp 97.6°F | Resp 16 | Ht 61.5 in | Wt 144.4 lb

## 2021-03-16 DIAGNOSIS — M109 Gout, unspecified: Secondary | ICD-10-CM

## 2021-03-16 DIAGNOSIS — K219 Gastro-esophageal reflux disease without esophagitis: Secondary | ICD-10-CM

## 2021-03-16 DIAGNOSIS — E785 Hyperlipidemia, unspecified: Secondary | ICD-10-CM

## 2021-03-16 DIAGNOSIS — N1831 Chronic kidney disease, stage 3a: Secondary | ICD-10-CM

## 2021-03-16 DIAGNOSIS — E039 Hypothyroidism, unspecified: Secondary | ICD-10-CM

## 2021-03-16 DIAGNOSIS — L309 Dermatitis, unspecified: Secondary | ICD-10-CM

## 2021-03-16 DIAGNOSIS — J4520 Mild intermittent asthma, uncomplicated: Secondary | ICD-10-CM

## 2021-03-16 DIAGNOSIS — Z79899 Other long term (current) drug therapy: Secondary | ICD-10-CM

## 2021-03-16 DIAGNOSIS — I10 Essential (primary) hypertension: Secondary | ICD-10-CM

## 2021-03-16 DIAGNOSIS — Z23 Encounter for immunization: Secondary | ICD-10-CM

## 2021-03-16 LAB — COMPREHENSIVE METABOLIC PANEL
ALT: 21 U/L (ref 0–55)
AST (SGOT): 17 U/L (ref 5–34)
Albumin/Globulin Ratio: 1.3 (ref 0.9–2.2)
Albumin: 4 g/dL (ref 3.5–5.0)
Alkaline Phosphatase: 64 U/L (ref 37–117)
Anion Gap: 8 (ref 5.0–15.0)
BUN: 15 mg/dL (ref 7.0–19.0)
Bilirubin, Total: 0.3 mg/dL (ref 0.2–1.2)
CO2: 25 mEq/L (ref 21–29)
Calcium: 9.5 mg/dL (ref 7.9–10.2)
Chloride: 105 mEq/L (ref 100–111)
Creatinine: 1 mg/dL (ref 0.4–1.5)
Globulin: 3.1 g/dL (ref 2.0–3.6)
Glucose: 97 mg/dL (ref 70–100)
Potassium: 4.2 mEq/L (ref 3.5–5.1)
Protein, Total: 7.1 g/dL (ref 6.0–8.3)
Sodium: 138 mEq/L (ref 136–145)

## 2021-03-16 LAB — HEMOLYSIS INDEX: Hemolysis Index: 2 (ref 0–24)

## 2021-03-16 LAB — TSH: TSH: 3.15 u[IU]/mL (ref 0.35–4.94)

## 2021-03-16 LAB — LIPID PANEL
Cholesterol / HDL Ratio: 4
Cholesterol: 143 mg/dL (ref 0–199)
HDL: 36 mg/dL — ABNORMAL LOW (ref 40–9999)
LDL Calculated: 75 mg/dL (ref 0–99)
Triglycerides: 161 mg/dL — ABNORMAL HIGH (ref 34–149)
VLDL Calculated: 32 mg/dL (ref 10–40)

## 2021-03-16 LAB — GFR: EGFR: 52.8

## 2021-03-16 LAB — URIC ACID: Uric acid: 5.2 mg/dL (ref 2.6–6.0)

## 2021-03-16 LAB — MAGNESIUM: Magnesium: 1.9 mg/dL (ref 1.6–2.6)

## 2021-03-16 LAB — VITAMIN B12: Vitamin B-12: 573 pg/mL (ref 211–911)

## 2021-03-16 MED ORDER — SHINGRIX 50 MCG/0.5ML IM SUSR
50.0000 ug | Freq: Once | INTRAMUSCULAR | 0 refills | Status: AC
Start: 2021-03-16 — End: 2021-03-16

## 2021-03-16 MED ORDER — TRIAMCINOLONE ACETONIDE 0.1 % EX CREA
TOPICAL_CREAM | Freq: Two times a day (BID) | CUTANEOUS | 1 refills | Status: DC
Start: 2021-03-16 — End: 2022-05-19

## 2021-03-16 NOTE — Assessment & Plan Note (Signed)
A/P: stable, no interim flare  Check uric acid  Continue low purine diet  Continue current med

## 2021-03-16 NOTE — Assessment & Plan Note (Signed)
A/P: If office BP is elevated.  Pt has not been monitoring at home.  Advised to monitor at home daily x 2 weeks and send log.    Continued to counsel on regular exercise, weight control, optimizing heart healthy diet    Recommend optimizing low sodium diet measures ( less than 2 grams of sodium in the diet per day ).  Labs as ordered

## 2021-03-16 NOTE — Assessment & Plan Note (Signed)
A/P: continue current medications  Referred to [facility]  [address]  [address]  [place], [place] [address]  [phone]  [phone]

## 2021-03-16 NOTE — Progress Notes (Signed)
Subjective:    Date: 03/16/2021 8:27 AM   Patient ID: Leslie Ray is a 86 y.o. female.    Chief Complaint   Patient presents with   . Rash     Rash on the lower part of the left leg, itchy, about 3 months   . Hyperlipidemia     Routine health maintenance   . Hypertension     HPI    Pt c/o rash left lower leg x couple months, occasional itch, has been using corisone10 and moisturizing it.     Problem   Stage 3a Chronic Kidney Disease        Vit D:     Lab Results   Component Value Date    VITD 56 05/28/2020    VITD 50 12/14/2019         No results found for: PTH    Renal Function Trend:  Lab Results   Component Value Date    CREAT 1.0 05/28/2020    CREAT 1.1 03/14/2020    CREAT 1.0 12/14/2019    CREAT 1.0 08/08/2019    CREAT 1.3 02/12/2019       Lab Results   Component Value Date    EGFR 52.9 05/28/2020    EGFR 47.4 03/14/2020    EGFR 53.0 12/14/2019    EGFR 53.0 08/08/2019    EGFR 39.2 02/12/2019          Hypothyroid    Med:  Levothyroxine (Synthroid) brand only 75 mcg daily (since 1989 when had partial thyroidectomy)  Pt takes synthroid in the AM and waits at least 3-4 hours before she eats breakfast or takes any other vitamins/medications    Compliant with current regimen.  Denies side effects to therapy.  Denies any change in hair/nails/skin, constipation, unexplained wt changes.      Lab Results   Component Value Date    TSH 3.86 11/09/2019    TSH 5.04 (H) 08/08/2019    TSH 0.55 02/12/2019          Mild Intermittent Asthma    Albuterol inh prn  symbicort  singulair  flonase  Allegra     Pulm: Dr. Illene Bolus, pt is due for followup, pt states previous appointment was canceled and she has not rescheduled.     PFT 12/27/2018 - mild obstruction with a moderate decrease in diffusion capacity. No significant response to bronchodilator  Pt is not taking symbicort regularly.  Last used albuterol several weeks ago.  Pt states breathing is improved, back to baseline.     Pt reports doing well, occasional albuterol use,  has not used in >1 month.  Has not heard herself wheeze since started symbicort.    03/16/2021- pt states she lost the pulmonary contact information so has not followed up. She also would like a close pulmonary specialist.      Genella Rife (Gastroesophageal Reflux Disease)    Esomeprazole 20 mg , taking 1 tablet daily      Pt is asymptomatic on current medication  No known history of GI ulcer.       Magnesium   Date Value Ref Range Status   03/14/2020 1.8 1.6 - 2.6 mg/dL Final   60/45/4098 1.9 1.6 - 2.6 mg/dL Final     Vitamin J-19   Date Value Ref Range Status   03/14/2020 749 211 - 911 pg/mL Final   02/12/2019 788 211 - 911 pg/mL Final          Hyperlipidemia  On atorvastatin 40 mg daily      Pt has hyperlipidemia and is compliant with anti-lipidemic therapy.  Pt denies side effects of anti-lipidemic therapy - specifically denies myalgia.        Lab Results   Component Value Date    CHOL 165 03/14/2020    CHOL 168 08/08/2019    CHOL 128 02/12/2019     Lab Results   Component Value Date    HDL 36 (L) 03/14/2020    HDL 41 08/08/2019    HDL 40 02/12/2019     Lab Results   Component Value Date    LDL 85 03/14/2020    LDL 93 08/08/2019    LDL 71 02/12/2019     Lab Results   Component Value Date    TRIG 222 (H) 03/14/2020    TRIG 169 (H) 08/08/2019    TRIG 85 02/12/2019          Essential Hypertension    Med:  Amlodipine 10 mg   Toprol Xl 50 mg daily - increase to 100 mg 11/14/2019  Losartan 100 mg daily    Occasional shortness of breath when carrying heavy items.   Pt has long history of dizziness, however not recently  At end of day has very slight swelling in legs.       Pt is compliant with current medication. No adverse effects reported to medication and compliance is reported to be good.   Pt denies CP/chest discomfort, palpitations, syncope or near syncope, orthopnea, or PND.    Home BP:  Pt reports has not checked BP for a while.      Pt was in the ED on 03/22/2019 for headache and CBC showed normal WBC and kidney  function nml creat 1.0    Renal Function Trend:  Lab Results   Component Value Date    CREAT 1.0 05/28/2020    CREAT 1.1 03/14/2020    CREAT 1.0 12/14/2019    CREAT 1.0 08/08/2019    CREAT 1.3 02/12/2019       Lab Results   Component Value Date    EGFR 52.9 05/28/2020    EGFR 47.4 03/14/2020    EGFR 53.0 12/14/2019    EGFR 53.0 08/08/2019    EGFR 39.2 02/12/2019       Lab Results   Component Value Date    WBC 9.92 (H) 03/14/2020    HGB 11.4 03/14/2020    HCT 38.9 03/14/2020    MCV 100.0 (H) 03/14/2020    PLT 361 (H) 03/14/2020          Gout       Flare has occurred on both feet. Has been ~ 5 years ago      On allopurinol 100 mg daily    No interim flare    Lab Results   Component Value Date    URICACID 5.9 03/14/2020    URICACID 7.3 (H) 11/09/2019    URICACID 6.0 08/08/2019    URICACID 6.1 (H) 02/12/2019              Patient Active Problem List   Diagnosis   . Gout   . GERD (gastroesophageal reflux disease)   . Hyperlipidemia   . Essential hypertension   . Allergic rhinitis   . Mild intermittent asthma   . Irregular bowel habits   . Hypothyroid   . Stage 3a chronic kidney disease   . Counseling on health promotion and disease prevention   . Osteoporosis  Patient Care Team:  Jules Husbands, DO as PCP - General (Family Medicine)  Loman Chroman, MD as Consulting Physician (Critical Care Medicine)  [employer] [facility] as Consulting Physician (Optometry)    Immunization History   Administered Date(s) Administered   . COVID-19 Ad26 Vaccine Preservative Free 0.5 mL (JANSSEN) 01/08/2020   . COVID-19 mRNA vaccine 12 years and above [employer]) 30 mcg/0.3 mL 09/25/2020   . Hepatitis B (Adult) 07/21/1992, 08/18/1992, 02/09/1993   . INFLUENZA HIGH DOSE 65 YRS+ Quad 0.7 mL 09/15/2020   . Influenza (Im) Preservative Free 11/10/2012, 07/30/2013   . Influenza (Im) Preserved TRIVALENT VACCINE 11/04/2005   . Influenza Vacc QUAD Recombinant PF 10yrs & up 08/14/2019   . Influenza quadrivalent (IM) 6 months & up PRESERVED  (Afluria/Fluzone) 09/28/2018   . Pneumococcal 23 valent 03/18/2002   . Pneumococcal Conjugate 13-Valent 07/03/2015   . Tdap 05/14/2019     Current Outpatient Medications   Medication Sig Dispense Refill   . Albuterol Sulfate 108 (90 Base) MCG/ACT Aerosol Pwdr, Breath Activated Inhale 2 puffs into the lungs every 4 (four) hours as needed (shortness of breath) 1 each 1   . alendronate (FOSAMAX) 70 MG tablet Take 1 tablet (70 mg total) by mouth once a week Take 1 tab by mouth every 7 days with a full glass of water on an empty stomach. Do not take anything else by mouth or lie down for 30 mins. 12 tablet 3   . allopurinol (ZYLOPRIM) 100 MG tablet TAKE ONE TABLET BY MOUTH DAILY 90 tablet 3   . amLODIPine (NORVASC) 10 MG tablet TAKE ONE TABLET BY MOUTH DAILY 90 tablet 3   . atorvastatin (LIPITOR) 40 MG tablet TAKE ONE TABLET BY MOUTH DAILY 90 tablet 3   . budesonide-formoterol (SYMBICORT) 160-4.5 MCG/ACT inhaler Inhale 2 puffs into the lungs 2 (two) times daily 10.2 g 2   . calcium-vitamin D (OSCAL-500 + D) 500-200 MG-UNIT per tablet Take 1 tablet by mouth 2 (two) times daily 600mg  (1500mg ) - 200U tab     . diclofenac Sodium (VOLTAREN) 1 % Gel topical gel Apply 4 g to affected area 4 times daily as needed.  Max: 16 g/joint/day.  Do not apply to broken, inflamed, or infected skin. 100 g 1   . esomeprazole (NEXIUM) 20 MG capsule Take 20 mg by mouth every morning before breakfast     . fluticasone (Flonase) 50 MCG/ACT nasal spray 2 sprays by Nasal route daily 16 g 3   . Loratadine (CLARITIN PO) Take 5 mg by mouth daily        . losartan (COZAAR) 100 MG tablet TAKE ONE TABLET BY MOUTH DAILY 90 tablet 3   . meclizine (ANTIVERT) 12.5 MG tablet Take 25 mg by mouth every 8 (eight) hours as needed     . metoprolol succinate (TOPROL-XL) 100 MG 24 hr tablet TAKE ONE TABLET BY MOUTH DAILY 90 tablet 3   . montelukast (SINGULAIR) 10 MG tablet TAKE ONE TABLET BY MOUTH ONCE NIGHTLY 90 tablet 3   . Omega-3 Fatty Acids (OMEGA-3 FISH OIL)  500 MG Cap Take 2 capsules by mouth 2 (two) times daily     . Synthroid 75 MCG tablet TAKE ONE TABLET BY MOUTH DAILY 90 tablet 0   . triamcinolone (KENALOG) 0.1 % cream Apply topically 2 (two) times daily 30 g 1   . Zoster Vac Recomb Adjuvanted (Shingrix) 50 MCG/0.5ML Recon Susp Inject 50 mcg into the muscle once for 1 dose  Repeat in 2-6 months for 2nd dose. Total 2 doses. 2 each 0     No current facility-administered medications for this visit.     Allergies   Allergen Reactions   . Penicillins        Past Medical History:   Diagnosis Date   . Allergies    . Anemia    . Arthritis    . Asthma    . Bowel habit changes    . Chest pain    . Disorder of thyroid    . Gastroesophageal reflux disease    . Gout    . Hyperglycemia    . Hyperlipidemia    . Hypertension    . Laxative habit    . Murmur    . Vertigo      Past Surgical History:   Procedure Laterality Date   . BLADDER REPAIR  1999   . CATARACT EXTRACTION, BILATERAL  2002   . HYSTERECTOMY  1999   . THYROIDECTOMY, PARTIAL  1989    tumor removed - patient states was not cancer       Family History   Problem Relation Age of Onset   . Emphysema Mother         smoker   . Heart attack Father 59   . Emphysema Brother        Social History     Socioeconomic History   . Marital status: Widowed   Tobacco Use   . Smoking status: Never Smoker   . Smokeless tobacco: Never Used   Vaping Use   . Vaping Use: Never used   Substance and Sexual Activity   . Alcohol use: Never   . Drug use: Never     The following portions of the patient's history were reviewed and updated as appropriate: allergies, current medications, past family history, past medical history, past social history, past surgical history and problem list.    Review of Systems     Objective:   BP 160/63 (BP Site: Right arm, Patient Position: Sitting, Cuff Size: Medium)   Pulse 79   Temp 97.6 F (36.4 C) (Temporal)   Resp 16   Ht 1.562 m (5' 1.5")   Wt 65.5 kg (144 lb 6.4 oz)   BMI 26.85 kg/m   Wt Readings from  Last 3 Encounters:   03/16/21 65.5 kg (144 lb 6.4 oz)   09/15/20 63.1 kg (139 lb 3.2 oz)   05/28/20 64.3 kg (141 lb 12.8 oz)     Physical Exam  Vitals and nursing note reviewed.   Constitutional:       General: She is not in acute distress.  Neck:      Thyroid: No thyroid mass or thyromegaly.      Vascular: No carotid bruit.   Cardiovascular:      Rate and Rhythm: Normal rate and regular rhythm.      Heart sounds: S1 normal and S2 normal. No murmur heard.     Comments:     Pulmonary:      Effort: Pulmonary effort is normal.      Breath sounds: Decreased breath sounds present. No wheezing, rhonchi or rales.   Chest:   Breasts:      Right: No supraclavicular adenopathy.      Left: No supraclavicular adenopathy.       Musculoskeletal:      Cervical back: Neck supple.      Right lower leg: No edema.      Left  lower leg: No edema.   Lymphadenopathy:      Cervical: No cervical adenopathy.      Upper Body:      Right upper body: No supraclavicular adenopathy.      Left upper body: No supraclavicular adenopathy.   Skin:            Comments: Erythematous rash left lower leg  Papular,  Mild scale            Assessment/Plan:       1. Essential hypertension    2. Gastroesophageal reflux disease, unspecified whether esophagitis present  - Magnesium  - Vitamin B12    3. Gout of foot, unspecified cause, unspecified chronicity, unspecified laterality  - Uric acid    4. Hyperlipidemia, unspecified hyperlipidemia type  - Comprehensive metabolic panel  - Lipid panel    5. Hypothyroidism, unspecified type  - TSH    6. Mild intermittent asthma without complication    7. Stage 3a chronic kidney disease    8. Need for shingles vaccine  - Zoster Vac Recomb Adjuvanted (Shingrix) 50 MCG/0.5ML Recon Susp; Inject 50 mcg into the muscle once for 1 dose Repeat in 2-6 months for 2nd dose. Total 2 doses.  Dispense: 2 each; Refill: 0    9. Medication management  - Magnesium  - Vitamin B12    10. Dermatitis  - triamcinolone (KENALOG) 0.1 % cream;  Apply topically 2 (two) times daily  Dispense: 30 g; Refill: 1  Keep moisturizing 2-3 times daily  Risk & Benefits of the new medication(s) were explained to the patient (and family) who verbalized understanding & agreed to the treatment plan. Patient (family) encouraged to contact me/clinical staff with any questions/concerns  Patient advised to call or return to clinic if these symptoms worsen or fail to improve as anticipated.  Return in 2-4 weeks if no improvement.     Essential hypertension  A/P: If office BP is elevated.  Pt has not been monitoring at home.  Advised to monitor at home daily x 2 weeks and send log.    Continued to counsel on regular exercise, weight control, optimizing heart healthy diet    Recommend optimizing low sodium diet measures ( less than 2 grams of sodium in the diet per day ).  Labs as ordered    Gout  A/P: stable, no interim flare  Check uric acid  Continue low purine diet  Continue current med    Hyperlipidemia  A/P:   Lab Results   Component Value Date    CHOL 165 03/14/2020    CHOL 168 08/08/2019    TRIG 222 (H) 03/14/2020    TRIG 169 (H) 08/08/2019    HDL 36 (L) 03/14/2020    HDL 41 08/08/2019    LDL 85 03/14/2020    LDL 93 08/08/2019     Recommend patient consume a healthy diet that emphasizes the intake of vegetables, fruits, nuts, whole grains, lean vegetable or animal protein, and fish and minimizes the intake of trans fats, red meat and process meats, refined carbohydrates, and sugar sweetened beverages.  Recommend patient engage in at least 150 minutes per week of accumulated moderate-intensity physical activity or 75 minutes per week of vigorous-intensity physical activity.  Prior LDL values within acceptable limits on appropriate intensity statin medication.  Continue current medication therapy.  Obtain lipid indices today.  Continue to optimize a heart healthy diet and aerobic exercise efforts.      Hypothyroid  A/P:  Lab Results   Component Value Date    TSH 3.86  11/09/2019    T4FREE 1.11 11/09/2019     Pt is clinically euthyroid.  Prior TFT's are within target range. Continue current thyroid replacement medication regimen. Obtain surveillance TFT's today.  Reviewed hypo and hyperthyroid symptoms that warrant sooner evaluation      Mild intermittent asthma  A/P: continue current medications  Referred to [facility]  [address]  [address]  [place], [place] [address]  [phone]  [phone]      Reminder to get shingles vaccine      Return in about 6 months (around 09/16/2021) for Medicare Wellness Visit, Return sooner as needed.    Jules Husbands, DO

## 2021-03-16 NOTE — Assessment & Plan Note (Signed)
A/P:   Lab Results   Component Value Date    CHOL 165 03/14/2020    CHOL 168 08/08/2019    TRIG 222 (H) 03/14/2020    TRIG 169 (H) 08/08/2019    HDL 36 (L) 03/14/2020    HDL 41 08/08/2019    LDL 85 03/14/2020    LDL 93 08/08/2019     Recommend patient consume a healthy diet that emphasizes the intake of vegetables, fruits, nuts, whole grains, lean vegetable or animal protein, and fish and minimizes the intake of trans fats, red meat and process meats, refined carbohydrates, and sugar sweetened beverages.  Recommend patient engage in at least 150 minutes per week of accumulated moderate-intensity physical activity or 75 minutes per week of vigorous-intensity physical activity.  Prior LDL values within acceptable limits on appropriate intensity statin medication.  Continue current medication therapy.  Obtain lipid indices today.  Continue to optimize a heart healthy diet and aerobic exercise efforts.

## 2021-03-16 NOTE — Progress Notes (Signed)
Have you seen any specialists/other providers since your last visit with [place]?    No    Arm preference verified?   Yes    The patient is due for Advance directive, Shingrix,

## 2021-03-16 NOTE — Assessment & Plan Note (Signed)
A/P:    Lab Results   Component Value Date    TSH 3.86 11/09/2019    T4FREE 1.11 11/09/2019     Pt is clinically euthyroid.  Prior TFT's are within target range. Continue current thyroid replacement medication regimen. Obtain surveillance TFT's today.  Reviewed hypo and hyperthyroid symptoms that warrant sooner evaluation

## 2021-04-20 ENCOUNTER — Other Ambulatory Visit (INDEPENDENT_AMBULATORY_CARE_PROVIDER_SITE_OTHER): Payer: Self-pay | Admitting: Family Medicine

## 2021-04-29 ENCOUNTER — Other Ambulatory Visit (INDEPENDENT_AMBULATORY_CARE_PROVIDER_SITE_OTHER): Payer: Self-pay | Admitting: Family Medicine

## 2021-05-03 ENCOUNTER — Other Ambulatory Visit (INDEPENDENT_AMBULATORY_CARE_PROVIDER_SITE_OTHER): Payer: Self-pay | Admitting: Family Medicine

## 2021-05-03 DIAGNOSIS — M109 Gout, unspecified: Secondary | ICD-10-CM

## 2021-05-21 ENCOUNTER — Encounter (INDEPENDENT_AMBULATORY_CARE_PROVIDER_SITE_OTHER): Payer: Self-pay | Admitting: Internal Medicine

## 2021-05-21 ENCOUNTER — Ambulatory Visit (INDEPENDENT_AMBULATORY_CARE_PROVIDER_SITE_OTHER): Payer: Medicare Other | Admitting: Internal Medicine

## 2021-05-21 VITALS — BP 157/77 | HR 83 | Temp 97.1°F | Resp 16 | Wt 144.2 lb

## 2021-05-21 DIAGNOSIS — J309 Allergic rhinitis, unspecified: Secondary | ICD-10-CM

## 2021-05-21 DIAGNOSIS — J4521 Mild intermittent asthma with (acute) exacerbation: Secondary | ICD-10-CM

## 2021-05-21 MED ORDER — PREDNISONE 10 MG PO TABS
ORAL_TABLET | ORAL | 0 refills | Status: DC
Start: 2021-05-21 — End: 2021-09-16

## 2021-05-21 MED ORDER — AZITHROMYCIN 250 MG PO TABS
ORAL_TABLET | ORAL | 0 refills | Status: AC
Start: 2021-05-21 — End: 2021-05-27

## 2021-05-21 NOTE — Progress Notes (Signed)
Subjective:       Patient ID: Leslie Ray is a 86 y.o. female.    HPI  pt c/o     Chest congestion, cough x 5d,   Wheezing, sob,   No fever,   Slightly productive  Asthma, compliant with inhalers  Using albuterol several times /day    Had an exacerbation 1 month ago, rx'd with prednisone, amox by pulm     +AR, using claritin, flonase  Reported home covid test neg x2    The following portions of the patient's history were reviewed and updated as appropriate: allergies, current medications, past family history, past medical history, past social history, past surgical history, and problem list.    Review of Systems   Constitutional:  Negative for appetite change and unexpected weight change.   Respiratory:  Positive for cough, shortness of breath and wheezing.    Cardiovascular:  Negative for chest pain and palpitations.   Gastrointestinal:  Negative for abdominal pain, nausea and vomiting.   Genitourinary:  Negative for dysuria.   Musculoskeletal:  Negative for myalgias.   Neurological:  Negative for syncope, light-headedness and headaches.     BP 157/77 (BP Site: Left arm, Patient Position: Sitting, Cuff Size: Medium)    Pulse 83    Temp 97.1 F (36.2 C) (Temporal)    Resp 16    Wt 65.4 kg (144 lb 3.2 oz)    SpO2 96%    BMI 26.81 kg/m        Objective:    Physical Exam  Vitals reviewed.   Constitutional:       General: She is not in acute distress.     Appearance: She is well-developed.   Eyes:      General: No scleral icterus.     Conjunctiva/sclera: Conjunctivae normal.   Neck:      Thyroid: No thyromegaly.      Vascular: No carotid bruit or JVD.   Cardiovascular:      Rate and Rhythm: Normal rate and regular rhythm.      Heart sounds: Normal heart sounds. No murmur heard.    No friction rub. No gallop.   Pulmonary:      Effort: Pulmonary effort is normal. No respiratory distress.      Breath sounds: Rhonchi present. No rales.   Abdominal:      General: Bowel sounds are normal. There is no distension.       Palpations: Abdomen is soft.      Tenderness: There is no abdominal tenderness. There is no guarding or rebound.   Musculoskeletal:      Cervical back: Neck supple.      Right lower leg: No edema.      Left lower leg: No edema.   Neurological:      Mental Status: She is alert.           Assessment:       1. Mild intermittent asthma with exacerbation  predniSONE (DELTASONE) 10 MG tablet    azithromycin (ZITHROMAX) 250 MG tablet      2. Allergic rhinitis, unspecified seasonality, unspecified trigger               Plan:      Procedures  No orders of the defined types were placed in this encounter.      Rx prednisone 40mg  taper over 10d  Rx zpak  Continue inhalers, albuterol prn  Keep hydration, rest   Change claritin to zyrtec  Continue flonase  F/u if sx persistent/worse.

## 2021-05-21 NOTE — Progress Notes (Signed)
Have you seen any specialists/other providers since your last visit with [place]?    Yes,     Arm preference verified?   Yes    The patient is due for shingles vaccine and AVD, covid -19 vaccine 3

## 2021-06-25 ENCOUNTER — Encounter (INDEPENDENT_AMBULATORY_CARE_PROVIDER_SITE_OTHER): Payer: Self-pay | Admitting: Family Medicine

## 2021-07-15 ENCOUNTER — Other Ambulatory Visit (INDEPENDENT_AMBULATORY_CARE_PROVIDER_SITE_OTHER): Payer: Self-pay | Admitting: Family Medicine

## 2021-09-16 ENCOUNTER — Encounter (INDEPENDENT_AMBULATORY_CARE_PROVIDER_SITE_OTHER): Payer: Self-pay | Admitting: Family Medicine

## 2021-09-16 ENCOUNTER — Ambulatory Visit (INDEPENDENT_AMBULATORY_CARE_PROVIDER_SITE_OTHER): Payer: Medicare Other | Admitting: Family Medicine

## 2021-09-16 VITALS — BP 152/81 | HR 71 | Temp 96.4°F | Resp 20 | Ht 60.0 in | Wt 143.5 lb

## 2021-09-16 DIAGNOSIS — E039 Hypothyroidism, unspecified: Secondary | ICD-10-CM

## 2021-09-16 DIAGNOSIS — Z0000 Encounter for general adult medical examination without abnormal findings: Secondary | ICD-10-CM

## 2021-09-16 DIAGNOSIS — I10 Essential (primary) hypertension: Secondary | ICD-10-CM

## 2021-09-16 DIAGNOSIS — M109 Gout, unspecified: Secondary | ICD-10-CM

## 2021-09-16 DIAGNOSIS — N1831 Chronic kidney disease, stage 3a: Secondary | ICD-10-CM

## 2021-09-16 DIAGNOSIS — E785 Hyperlipidemia, unspecified: Secondary | ICD-10-CM

## 2021-09-16 DIAGNOSIS — Z0110 Encounter for examination of ears and hearing without abnormal findings: Secondary | ICD-10-CM

## 2021-09-16 DIAGNOSIS — K219 Gastro-esophageal reflux disease without esophagitis: Secondary | ICD-10-CM

## 2021-09-16 NOTE — Progress Notes (Signed)
[facility] [place]            Leslie Ray is a 86 y.o. female who presents today for the following Medicare Wellness Visit:  []  Initial Preventive Physical Exam (IPPE) - "Welcome to Medicare" preventive visit (Vision Screening required)   []  Annual Wellness Visit - Initial  [x]  Annual Wellness Visit - Subsequent     Health Risk Assessment:   During the past month, how would you rate your general health?:  Fair  Which of the following tasks can you do without assistance - drive or take the bus alone; shop for groceries or clothes; prepare your own meals; do your own housework/laundry; handle your own finances/pay bills; eat, bathe or get around your home?: Drive or take the bus alone, Handle your own finances/pay bills, Shop for groceries or clothes, Eat, bathe, dress or get around your home, Prepare your own meals, Do your own housework/laundry  Which of the following problems have you been bothered by in the past month - dizzy when standing up; problems using the phone; feeling tired or fatigued; moderate or severe body pain?: Dizzy when standing up, Feeling tired or fatigued, Moderate or severe body pain  Do you exercise for about 20 minutes 3 or more days per week?:Yes  During the past month was someone available to help if you needed and wanted help?  For example, if you felt nervous, lonely, got sick and had to stay in bed, needed someone to talk to, needed help with daily chores or needed help just taking care of yourself.: Yes  Do you always wear a seat belt?: Yes  Do you have any trouble taking medications the way you have been told to take them?: Yes  Have you been given any information that can help you with keeping track of your medications?: Yes  Do you have trouble paying for your medications?: No  Have you been given any information that can help you with hazards in your house, such as scatter rugs, furniture, etc?: Yes  Do you feel unsteady when standing or walking?:  Yes  Do you worry about falling?: Yes  Have you fallen two or more times in the past year?: Yes  Pt states she fell once.  She missed couple steps of the stairs.  States skinned her knees but otherwise was ok  Did you suffer any injuries from your falls in the past year?: Yes     Care Team:   Patient Care Team:  Jules Husbands, DO as PCP - General (Family Medicine)  Loman Chroman, MD as Consulting Physician (Critical Care Medicine)  [employer] as Consulting Physician (Optometry)      Hospitalizations:   Hospitalization within past year: [x]  No  []  Yes     Diagnosis:      Screenings:   Ambulatory Screenings 03/16/2021 09/16/2021 09/16/2021   Falls Risk: [place] more than 2 times in past year - Y Y   Falls Risk: Suffer any injuries? - Y Y   Depression: PHQ2 Total Score 0 - -   Depression: PHQ9 Total Score 0 - -        Substance Use Disorder Screen:  In the past year, how often have you used the following?  1) Alcohol (For men, 5 or more drinks a day. For women, 4 or more drinks a day)  [x]  Never []  Once or Twice []  Monthly []  Weekly []  Daily or Almost Daily  2) Tobacco Products  [  x] Never []  Once or Twice []  Monthly []  Weekly []  Daily or Almost Daily  3) Prescription Drugs for Non-Medical Reasons  [x]  Never []  Once or Twice []  Monthly []  Weekly []  Daily or Almost Daily  4) Illegal Drugs  [x]  Never []  Once or Twice []  Monthly []  Weekly []  Daily or Almost Daily             Functional Ability/Level of Safety:   Falls Risk/Home Safety Assessment:  ( see HRA and Screenings sections for additional assessment)  Home Safety: [x]  Stair handrails  []  Skid-resistant rugs/remove throw rugs   []  Grab bars  [x]  Clear pathways between rooms  []  Proper lighting stairs/ bathrooms/bedrooms  Get Up and Go (optional):  []   <20 secs  []   >20 secs    []   High risk for falls - Home Safety/Falls Risk Precautions reviewed with pt/family    Hearing Assessment:  Concerns for hearing loss: [x]  Yes  []   No reports last hearing evaluation  few years ago in [place]  No previous hearing aids  Hearing aids:   []   Right  []   Left  []   Bilateral   []   None  Whisper Test (optional):  []  Normal  []   Slightly decreased  []   Significantly decreased    Exercise:  Frequency:  []   No formal exercise  []   1-2x/wk  [x]   3-4x/wk  []   >4x/wk  Duration:  []   15-30 mins/day  [x]   30-45 mins/day  []   45+ mins/day  Intensity:  [x]   Light  []   Moderate  []   Heavy        Activities of Daily Living:   ADL's Independent Minimal  Assistance Moderate  Assistance Total   Assistance   Bathing [x]  []  []  []    Dressing [x]  []  []  []    Mobility   [x]  []  []  []    Transfer [x]  []  []  []    Eating [x]  []  []  []    Toileting [x]  []  []  []      IADL's Independent Minimal  Assistance Moderate  Assistance Total   Assistance   Phone [x]  []  []  []    Housekeeping [x]  []  []  []    Laundry [x]  []  []  []    Transportation []  []  [x] Pt does not drive []    Medications [x]  []  []  []    Finances [x]  []  []  []       ADL assistance: []  No assistance needed  []  Spouse  []  Sibling  []  Son   []  Daughter [x]  Children  []  Home Health Aide []  Other:       Advance Care Planning:   Discussion of Advance Directives:   []  Advance Directive in chart  []  Advance Directive not in chart - requested to provide [x]  No Advance Directive.  Form Provided  []  No Advance Directive.  Pt declines. []  Not addressed today  []  Other:    Discussed advance directive and encouraged to complete.  Handout given with additional information and form to complete.  Patient advised to contact me if any questions arise and to return the form to the office.    Patient can reference this website for additional information:  [email or web address]       Exam:   BP 152/81 (BP Site: Right arm, Patient Position: Sitting, Cuff Size: Medium)   Pulse 71   Temp (!) 96.4 F (35.8 C)   Resp 20   Ht 1.524 m (5')   Wt 65.1 kg (143 lb 8 oz)  BMI 28.03 kg/m      Physical Exam           Evaluation of Cognitive Function:   Mood/affect: [x]  Appropriate  []   Other:   Appearance: [x]  Neatly groomed  [x]  Adequately nourished  []  Other:  Family member/caregiver input: []  Present - no concerns  []   Not present in room  []  Present - concerns:    Cognitive Assessment:  Mini-Cog Result (three word registration- banana, sunrise, chair / clock drawing):   []   > 3 points - negative screen for dementia   []  3 recalled words - negative screen for dementia   [x]  1-2 recalled words and normal clock draw - negative for cognitive impairment   []  1-2 recalled words and abnormal clock draw - positive for cognitive impairment   []  0 recalled words - positive for cognitive impairment         Assessment/Plan:   1. Medicare annual wellness visit, subsequent    2. Stage 3a chronic kidney disease    3. Hypothyroidism, unspecified type    4. Hyperlipidemia, unspecified hyperlipidemia type    5. Gout of foot, unspecified cause, unspecified chronicity, unspecified laterality    6. Gastroesophageal reflux disease, unspecified whether esophagitis present    7. Essential hypertension    8. Encounter for hearing examination, unspecified whether abnormal findings  - Ambulatory referral to ENT    Recommend covid booster vaccination.    Recommend shingles vaccine - pt states cost was too high.    Education/Counseling:  During the course of the visit, the patient was educated and counseled about appropriate screening and preventive services including:   Influenza vaccine  Shingrix vaccine  COVID vaccine  Glaucoma screening  Nutrition counseling  Physical activity/exercise counseling  Heart healthy diet counseling  Falls risk prevention and home safety      Falls Risk/Home Safety:  The following falls risk/home safety measures were discussed with the patient:  [x]  Home Environment - 1) clear floors, walkways, and stairs of items 2) Remove throw rugs or use non-skid rugs 3) Install grab bars in bathroom 4) Use of handrails with  stairs 5) Proper lighting of hallways, bathroom, stairs, bedroom  [x]  Physical Activity - age appropriate activity/exercise 150 mins per week.  [x]  Nutrition - adequate hydration, limit soda, coffee, alcohol   [x]  Vision - yearly eye exam, keep glasses clean  [x]  Medications - side effect education       Jules Husbands, DO    09/16/2021     The following sections were reviewed this encounter by the provider:         History:   Patient Active Problem List   Diagnosis    Gout    GERD (gastroesophageal reflux disease)    Hyperlipidemia    Essential hypertension    Allergic rhinitis    Mild intermittent asthma    Irregular bowel habits    Hypothyroid    Stage 3a chronic kidney disease    Counseling on health promotion and disease prevention    Osteoporosis      Past Medical History:   Diagnosis Date    Allergies     Anemia     Arthritis     Asthma     Bowel habit changes     Chest pain     Disorder of thyroid     Gastroesophageal reflux disease     Gout     Hyperglycemia     Hyperlipidemia  Hypertension     Laxative habit     Murmur     Vertigo      Past Surgical History:   Procedure Laterality Date    BLADDER REPAIR  1999    CATARACT EXTRACTION, BILATERAL  2002    HYSTERECTOMY  1999    THYROIDECTOMY, PARTIAL  1989    tumor removed - patient states was not cancer     Allergies   Allergen Reactions    Penicillins       Outpatient Medications Marked as Taking for the 09/16/21 encounter (Office Visit) with Jules Husbands, DO   Medication Sig Dispense Refill    Albuterol Sulfate 108 (90 Base) MCG/ACT Aerosol Pwdr, Breath Activated Inhale 2 puffs into the lungs every 4 (four) hours as needed (shortness of breath) 1 each 1    alendronate (FOSAMAX) 70 MG tablet TAKE 1 TABLET BY MOUTH ONCE WEEKLY ON AN EMPTY STOMACH BEFORE BREAKFAST. REMAIN UPRIGHT FOR 30 MINUTES & TAKE WITH 8 OUNCES OF WATER 12 tablet 3    allopurinol (ZYLOPRIM) 100 MG tablet TAKE ONE TABLET BY MOUTH DAILY 90 tablet 3    amLODIPine (NORVASC) 10 MG  tablet TAKE ONE TABLET BY MOUTH DAILY 90 tablet 3    atorvastatin (LIPITOR) 40 MG tablet TAKE ONE TABLET BY MOUTH DAILY 90 tablet 3    budesonide-formoterol (SYMBICORT) 160-4.5 MCG/ACT inhaler Inhale 2 puffs into the lungs 2 (two) times daily 10.2 g 2    calcium-vitamin D (OSCAL-500 + D) 500-200 MG-UNIT per tablet Take 1 tablet by mouth 2 (two) times daily 600mg  (1500mg ) - 200U tab      diclofenac Sodium (VOLTAREN) 1 % Gel topical gel Apply 4 g to affected area 4 times daily as needed.  Max: 16 g/joint/day.  Do not apply to broken, inflamed, or infected skin. 100 g 1    esomeprazole (NEXIUM) 20 MG capsule Take 20 mg by mouth every morning before breakfast      fluticasone (Flonase) 50 MCG/ACT nasal spray 2 sprays by Nasal route daily 16 g 3    Loratadine (CLARITIN PO) Take 5 mg by mouth daily         losartan (COZAAR) 100 MG tablet TAKE ONE TABLET BY MOUTH DAILY 90 tablet 3    meclizine (ANTIVERT) 12.5 MG tablet Take 25 mg by mouth every 8 (eight) hours as needed      metoprolol succinate (TOPROL-XL) 100 MG 24 hr tablet TAKE ONE TABLET BY MOUTH DAILY 90 tablet 3    montelukast (SINGULAIR) 10 MG tablet TAKE ONE TABLET BY MOUTH ONCE NIGHTLY 90 tablet 3    Omega-3 Fatty Acids (OMEGA-3 FISH OIL) 500 MG Cap Take 2 capsules by mouth 2 (two) times daily      omeprazole (PriLOSEC) 40 MG capsule Take 1 capsule by mouth every 24 hours      Synthroid 75 MCG tablet TAKE ONE TABLET BY MOUTH DAILY 90 tablet 3    triamcinolone (KENALOG) 0.1 % cream Apply topically 2 (two) times daily 30 g 1     Social History     Tobacco Use    Smoking status: Never    Smokeless tobacco: Never   Vaping Use    Vaping Use: Never used   Substance Use Topics    Alcohol use: Never    Drug use: Never      Family History   Problem Relation Age of Onset    Emphysema Mother  smoker    Heart attack Father 80    Emphysema Brother            ===================================================================    Additional Documentation:

## 2021-09-16 NOTE — Progress Notes (Signed)
Have you seen any specialists since your last visit with [place]?  Yes, pulmonology      The patient was informed that the following HM items are still outstanding:   Health Maintenance Due   Topic    Advance Directive on File     Shingrix Vaccine 50+ (2)    COVID-19 Vaccine (3 - Booster for [employer] series)    INFLUENZA VACCINE     FALLS RISK [employer] Annual Wellness Visit

## 2021-09-16 NOTE — Patient Instructions (Signed)
MEDICARE WELLNESS PERSONAL PREVENTION PLAN   As part of the Medicare Wellness portion of your visit today, we are providing you with this personalized preventative plan of care. We have listed below some of the preventative services that are recommended for patients based upon their age and gender. These recommendations are taken directly from the [place] [employer] (USPSTF) and the [employer] (ACIP).     Health Maintenance   Topic Date Due    Advance Directive on File  Never done    Shingrix Vaccine 50+ (2) 05/13/2002    COVID-19 Vaccine (3 - Booster for [employer] series) 11/20/2020    DEPRESSION SCREENING  03/16/2022    FALLS RISK ANNUAL  09/16/2022    Medicare Annual Wellness Visit  09/16/2022    Tetanus Ten-Year  05/13/2029    INFLUENZA VACCINE  Completed    DXA Scan  Completed    Pneumonia Vaccine Age 48+  Completed       Health Maintenance Topics with due status: Overdue       Topic Date Due    Advance Directive on File Never done    Shingrix Vaccine 50+ 05/13/2002    COVID-19 Vaccine 11/20/2020        Immunization History   Administered Date(s) Administered    COVID-19 Ad26 vaccine 18 years and above ([employer]) 0.5 mL 01/08/2020    COVID-19 mRNA MONOVALENT vaccine PRIMARY SERIES 12 years and above [employer]) 30 mcg/0.3 mL (DILUTE BEFORE USE) 09/25/2020    Hepatitis B (Adult) 07/21/1992, 08/18/1992, 02/09/1993    INFLUENZA HIGH DOSE 65 YRS+ Quad 0.7 mL 09/15/2020, 09/02/2021    Influenza (Im) Preservative Free 11/10/2012, 07/30/2013    Influenza (Im) Preserved TRIVALENT VACCINE 11/04/2005    Influenza Vacc QUAD Recombinant PF 80yrs & up 08/14/2019    Influenza quadrivalent (IM) 6 months & up PRESERVED (Afluria/Fluzone) 09/28/2018    Pneumococcal 23 valent 03/18/2002    Pneumococcal Conjugate 13-Valent 07/03/2015    Tdap 05/14/2019        Your major risk factors: Hypertension, Osteoporosis, and Asthma  Recommendations for improvement:  Elevated  Cholesterol/Low Salt Diet - Optimize adherence with low cholesterol diet.  Here is a good reference website for tips on healthy eating and ways to lower your cholesterol -  (NoCareers.gl)  Exercise - Engage in age appropriate exercise 150 minutes per week   Osteoporosis - Weight bearing exercises (walking, yoga, Tai-Chi) 150 minutes per week.  Here is a good reference for tips on engaging in weight bearing exercise - https://www.bones.ToysManual.co.za  Hypertension - Monitor your blood pressure daily. Here is a good reference website for tips on managing hypertension - MotivationalSites.no  Falls Prevention - Here is a good reference website for tips on avoiding falls - BikingRewards.pl  Compliance with prescription medications  Make sure that you follow-up with your specialists on a regular basis    The list below includes many common screening recommendations but is not meant to be comprehensive. You may be eligible for other preventative services depending upon your personal risk factors.   Colorectal Cancer Screening - All adults age 67-75 should undergo periodic colorectal cancer screening. The decision to screen for colorectal cancer in adults aged 20 to 59 years should be an individual one,taking into account your overall health and prior screening history.   Breast Cancer Screening - Women age 67-74 should have mammograms every other year (please note that this recommendation may not be appropriate for every woman - your physician  can answer specific questions you may have). The USPSTF concludes that the current evidence is insufficient to assess the balance of benefits and harms of screening mammography in women aged 74 years or older.    Cervical Cancer Screening - Women over 52 do not require pap smears as long as prior  screening has been normal and are not otherwise at high risk for cervical cancer. For women aged 75 to 18 years, the USPSTF recommends screening every 3 years with cervical cytology alone, every 5 years with high-risk human papillomavirus (hrHPV) testing alone, or every 5 years with hrHPV testing in combination with cytology (cotesting).   Osteoporosis Screening -  The USPSTF recommends screening for osteoporosis with bone measurement testing to prevent osteoporotic fractures in women 65 years and older.  Hepatitis C Screening - Recommend screening for hepatitis C virus (HCV) infection in all adults aged 66 to 32 years.  Lung cancer Screening - Recommend annual screening for lung cancer with low-dose computed tomography (LDCT) in adults ages 66 to 22 years who have a 20 pack-year smoking history and currently smoke or have quit within the past 15 years.  Recommended Vaccinations   Influenza one dose annually   Tetanus/diphtheria one booster every 10 years   Zoster/Shingles - Shingrix two doses after age 78 (second dose given 2-6 months after first dose)  Pneumovax (PPSV23) one dose for adults aged ?65 years  Prevnar(PCV13) shared clinical decision-making is recommended regarding administration of this vaccine to persons aged ?65 years who do not have an immunocompromising condition, cerebrospinal fluid leak, or cochlear implant.     PERSONAL PREVENTION PLAN   Your Personal Prevention Plan is based on your overall health and your responses to the health questionnaire you completed. The following information is for you to review in addition to the recommendations, referrals, and tests we have discussed at your visit.     Physical Activity:   Physical activity can help you maintain a healthy weight, prevent or control illness, reduce stress, and sleep better. It can also help you improve your balance to avoid falls. Try to build up to and maintain a total of 30 minutes of activity each day. If you are able, try  walking, doing yard or housework, and taking the stairs more often. You can also strengthen your muscles with exercises done while sitting or lying down.   Emotional Health:   Feeling "down in the dumps" or anxious every now and then is a natural part of life. If this feeling lasts for a few weeks or more, talk with me as soon as possible. It could be a sign of a problem that needs treatment. There are many types of treatment available.   Falls:   You can reduce your risk of falling by making changes in your home. Remove items that may cause tripping, improve lighting, and consider installing grab bars.   Talk with me if you have problems with balance and walking. To prevent falls, you may need your vision, hearing, or blood pressure checked. Exercises to improve your strength and balance, or using a cane or walker, may help. Review your medicines with me at every visit, because some can affect balance. Please be sure to let me know if you fall or are fearful you may fall.   Urinary Leakage:   Urine leakage is common, but it is not a normal part of aging. Talk with me about any urine leakage so that the cause can be found and treated. Treatment  can include bladder training, exercises, medicine or surgery.   Pain:   We all have aches and pains at times, but chronic pain can change how you feel and live every day. Please talk with me about any symptoms of chronic pain so that we can determine how best to treat.   Sleep:   Getting a good night's sleep is vital to your health and well-being and can help prevent or manage health problems. Often, sleep can be improved by changing behaviors, including when you go to bed and what you do before bed. Sleep apnea can cause problems such as struggling to stay awake during the day. Please let me know if you would like to learn more about improving your sleep and/or think you may have sleep apnea.   Seat Belt:   Please remember to wear a seat belt when driving or riding in a  vehicle. It is one of the most important things you can do to stay safe in a car.   Nutrition:   Remember to eat plenty of fruits, vegetables, whole grains, and dairy. Drink at least 64 ounces (8 full glasses) of water a day, unless you have been advised to limit fluids.   Alcohol:   Alcohol can have a greater effect on older people, who may feel its effects at a lower amount. Older people should limit alcoholic drinks (no more than one a day for women and no more than two a day for men). Please let me know if alcohol use becomes a problem.   Tobacco:   Not smoking or using other forms of tobacco is one of the most important things you can do for your health. Here is some more information about the importance of quitting smoking and how to quit smoking - BroadJournal.com.pt  Advance Directives:   There may come a time when medical decisions need to be made on your behalf. Please talk with your family, and with me, about your wishes. It is important to provide information about your decisions, and any formal advance directives, for your medical record. Here is additional information on advanced directives - http://wilson-mayo.com/.html  Additional Support:   Sometimes it can be challenging to manage all aspects of daily life. Finding the right support can help you maintain or improve your health and independence. Please let me know if you would like to talk further about finding resources to assist you.

## 2021-09-18 ENCOUNTER — Encounter (INDEPENDENT_AMBULATORY_CARE_PROVIDER_SITE_OTHER): Payer: Self-pay | Admitting: Family Medicine

## 2021-12-25 ENCOUNTER — Other Ambulatory Visit (INDEPENDENT_AMBULATORY_CARE_PROVIDER_SITE_OTHER): Payer: Self-pay | Admitting: Family Medicine

## 2021-12-25 DIAGNOSIS — R0600 Dyspnea, unspecified: Secondary | ICD-10-CM

## 2022-01-09 ENCOUNTER — Other Ambulatory Visit (INDEPENDENT_AMBULATORY_CARE_PROVIDER_SITE_OTHER): Payer: Self-pay | Admitting: Family Medicine

## 2022-01-19 ENCOUNTER — Encounter (INDEPENDENT_AMBULATORY_CARE_PROVIDER_SITE_OTHER): Payer: Self-pay | Admitting: Family Medicine

## 2022-02-17 ENCOUNTER — Ambulatory Visit (INDEPENDENT_AMBULATORY_CARE_PROVIDER_SITE_OTHER): Payer: Medicare Other | Admitting: Family Medicine

## 2022-02-24 ENCOUNTER — Other Ambulatory Visit (INDEPENDENT_AMBULATORY_CARE_PROVIDER_SITE_OTHER): Payer: Self-pay | Admitting: Family Medicine

## 2022-02-25 NOTE — Telephone Encounter (Signed)
Called and lvm for patient to schedule a f/up appointment    Thank you!

## 2022-04-05 ENCOUNTER — Other Ambulatory Visit (INDEPENDENT_AMBULATORY_CARE_PROVIDER_SITE_OTHER): Payer: Self-pay | Admitting: Family Medicine

## 2022-04-05 DIAGNOSIS — I10 Essential (primary) hypertension: Secondary | ICD-10-CM

## 2022-04-10 ENCOUNTER — Other Ambulatory Visit (INDEPENDENT_AMBULATORY_CARE_PROVIDER_SITE_OTHER): Payer: Self-pay | Admitting: Internal Medicine

## 2022-04-11 ENCOUNTER — Telehealth (INDEPENDENT_AMBULATORY_CARE_PROVIDER_SITE_OTHER): Payer: Self-pay | Admitting: Family Medicine

## 2022-04-15 NOTE — Telephone Encounter (Signed)
Called and lvm for patient to give the office a call to schedule an appointment     Thank you

## 2022-04-16 NOTE — Telephone Encounter (Signed)
Patient was scheduled for 7/26 in the afternoon as first available but pt prefers am appointment and sooner.  Please contact patient if possible to accommodate.

## 2022-05-16 ENCOUNTER — Other Ambulatory Visit (INDEPENDENT_AMBULATORY_CARE_PROVIDER_SITE_OTHER): Payer: Self-pay | Admitting: Family Medicine

## 2022-05-19 ENCOUNTER — Encounter (INDEPENDENT_AMBULATORY_CARE_PROVIDER_SITE_OTHER): Payer: Self-pay | Admitting: Family Medicine

## 2022-05-19 ENCOUNTER — Ambulatory Visit (INDEPENDENT_AMBULATORY_CARE_PROVIDER_SITE_OTHER): Payer: Medicare Other | Admitting: Family Medicine

## 2022-05-19 VITALS — BP 139/76 | HR 73 | Temp 98.0°F | Resp 20 | Ht 60.0 in | Wt 140.6 lb

## 2022-05-19 DIAGNOSIS — M109 Gout, unspecified: Secondary | ICD-10-CM

## 2022-05-19 DIAGNOSIS — I10 Essential (primary) hypertension: Secondary | ICD-10-CM

## 2022-05-19 DIAGNOSIS — E039 Hypothyroidism, unspecified: Secondary | ICD-10-CM

## 2022-05-19 DIAGNOSIS — E785 Hyperlipidemia, unspecified: Secondary | ICD-10-CM

## 2022-05-19 DIAGNOSIS — K219 Gastro-esophageal reflux disease without esophagitis: Secondary | ICD-10-CM

## 2022-05-19 DIAGNOSIS — J4520 Mild intermittent asthma, uncomplicated: Secondary | ICD-10-CM

## 2022-05-19 DIAGNOSIS — J309 Allergic rhinitis, unspecified: Secondary | ICD-10-CM

## 2022-05-19 DIAGNOSIS — N1831 Chronic kidney disease, stage 3a: Secondary | ICD-10-CM

## 2022-05-19 DIAGNOSIS — M810 Age-related osteoporosis without current pathological fracture: Secondary | ICD-10-CM

## 2022-05-19 LAB — LIPID PANEL
Cholesterol / HDL Ratio: 5 Index
Cholesterol: 149 mg/dL (ref 0–199)
HDL: 30 mg/dL — ABNORMAL LOW (ref 40–9999)
LDL Calculated: 87 mg/dL (ref 0–99)
Triglycerides: 159 mg/dL — ABNORMAL HIGH (ref 34–149)
VLDL Calculated: 32 mg/dL (ref 10–40)

## 2022-05-19 LAB — CBC AND DIFFERENTIAL
Absolute NRBC: 0 10*3/uL (ref 0.00–0.00)
Basophils Absolute Automated: 0.1 10*3/uL — ABNORMAL HIGH (ref 0.00–0.08)
Basophils Automated: 0.9 %
Eosinophils Absolute Automated: 0.72 10*3/uL — ABNORMAL HIGH (ref 0.00–0.44)
Eosinophils Automated: 6.7 %
Hematocrit: 35 % (ref 34.7–43.7)
Hgb: 11.8 g/dL (ref 11.4–14.8)
Immature Granulocytes Absolute: 0.06 10*3/uL (ref 0.00–0.07)
Immature Granulocytes: 0.6 %
Instrument Absolute Neutrophil Count: 5.99 10*3/uL (ref 1.10–6.33)
Lymphocytes Absolute Automated: 2.93 10*3/uL (ref 0.42–3.22)
Lymphocytes Automated: 27.2 %
MCH: 31 pg (ref 25.1–33.5)
MCHC: 33.7 g/dL (ref 31.5–35.8)
MCV: 91.9 fL (ref 78.0–96.0)
MPV: 9.8 fL (ref 8.9–12.5)
Monocytes Absolute Automated: 0.97 10*3/uL — ABNORMAL HIGH (ref 0.21–0.85)
Monocytes: 9 %
Neutrophils Absolute: 5.99 10*3/uL (ref 1.10–6.33)
Neutrophils: 55.6 %
Nucleated RBC: 0 /100 WBC (ref 0.0–0.0)
Platelets: 358 10*3/uL — ABNORMAL HIGH (ref 142–346)
RBC: 3.81 10*6/uL — ABNORMAL LOW (ref 3.90–5.10)
RDW: 13 % (ref 11–15)
WBC: 10.77 10*3/uL — ABNORMAL HIGH (ref 3.10–9.50)

## 2022-05-19 LAB — COMPREHENSIVE METABOLIC PANEL
ALT: 16 U/L (ref 0–55)
AST (SGOT): 22 U/L (ref 5–41)
Albumin/Globulin Ratio: 1.2 (ref 0.9–2.2)
Albumin: 3.9 g/dL (ref 3.5–5.0)
Alkaline Phosphatase: 66 U/L (ref 37–117)
Anion Gap: 9 (ref 5.0–15.0)
BUN: 18 mg/dL (ref 7.0–21.0)
Bilirubin, Total: 0.6 mg/dL (ref 0.2–1.2)
CO2: 24 mEq/L (ref 17–29)
Calcium: 9.7 mg/dL (ref 7.9–10.2)
Chloride: 105 mEq/L (ref 99–111)
Creatinine: 1 mg/dL (ref 0.4–1.0)
Globulin: 3.2 g/dL (ref 2.0–3.6)
Glucose: 91 mg/dL (ref 70–100)
Potassium: 4.3 mEq/L (ref 3.5–5.3)
Protein, Total: 7.1 g/dL (ref 6.0–8.3)
Sodium: 138 mEq/L (ref 135–145)
eGFR: 55 mL/min/{1.73_m2} — AB (ref >=60)

## 2022-05-19 LAB — THYROID STIMULATING HORMONE (TSH), REFLEX ON ABNORMAL TO FREE T4, SERUM: TSH, Abn Reflex to Free T4, Serum: 2.19 u[IU]/mL (ref 0.35–4.94)

## 2022-05-19 LAB — MAGNESIUM: Magnesium: 1.8 mg/dL (ref 1.6–2.6)

## 2022-05-19 LAB — HEMOGLOBIN A1C
Average Estimated Glucose: 114 mg/dL
Hemoglobin A1C: 5.6 % (ref 4.6–5.6)

## 2022-05-19 LAB — HEMOLYSIS INDEX: Hemolysis Index: 0 Index (ref 0–24)

## 2022-05-19 LAB — URIC ACID: Uric acid: 5.4 mg/dL (ref 2.6–7.1)

## 2022-05-19 LAB — VITAMIN B12: Vitamin B-12: 611 pg/mL (ref 211–911)

## 2022-05-19 MED ORDER — FLUTICASONE PROPIONATE 50 MCG/ACT NA SUSP
2.0000 | Freq: Every day | NASAL | 5 refills | Status: AC
Start: 2022-05-19 — End: ?

## 2022-05-19 NOTE — Assessment & Plan Note (Signed)
A/P: rx flonase  continue claritin  Follow-up with pulmonary as soon as possible.

## 2022-05-19 NOTE — Assessment & Plan Note (Signed)
A/P: no interim flare  Continue allopurinol  Check uric acid

## 2022-05-19 NOTE — Assessment & Plan Note (Signed)
A/P:   Lab Results   Component Value Date    CHOL 143 03/16/2021    CHOL 165 03/14/2020    TRIG 161 (H) 03/16/2021    TRIG 222 (H) 03/14/2020    HDL 36 (L) 03/16/2021    HDL 36 (L) 03/14/2020    LDL 75 03/16/2021    LDL 85 03/14/2020     Recommend patient consume a healthy diet that emphasizes the intake of vegetables, fruits, nuts, whole grains, lean vegetable or animal protein, and fish and minimizes the intake of trans fats, red meat and process meats, refined carbohydrates, and sugar sweetened beverages.  Prior LDL values within acceptable limits on appropriate intensity statin medication.  Continue current medication therapy.  Obtain lipid indices today.  Continue to optimize a heart healthy diet and aerobic exercise efforts.

## 2022-05-19 NOTE — Assessment & Plan Note (Signed)
A/P: stable  cotninue PPI  Check magnesium and vitamin B12

## 2022-05-19 NOTE — Assessment & Plan Note (Signed)
A/P: Continue alendronate  Will reach out  To clinical pharmacist to assist with patient dosing  Continue calcium and vitamin D  Encourage regular weight bearing exercise  DEXA ordered

## 2022-05-19 NOTE — Progress Notes (Signed)
Subjective     Leslie Ray is a 87 y.o. female who presents today for   Chief Complaint   Patient presents with    Hypertension    Hypothyroidism    Hyperlipidemia     HPI    Problem   Osteoporosis    05/13/2020 DEXA:  IMPRESSION:    Osteoporosis of the left femoral neck. Osteopenia of the  total left hip and lumbar spine.    Med:  Alendronate 70 mg weekly (rx'd 05/29/2020)  05/19/2022- pt reports sometimesshe skips alendronate because of difficulty coordinating taking with levothyroxine.       Renal Function Trend:  Lab Results   Component Value Date    CREAT 1.0 03/16/2021    CREAT 1.0 05/28/2020    CREAT 1.1 03/14/2020    CREAT 1.0 12/14/2019    CREAT 1.0 08/08/2019       Lab Results   Component Value Date    EGFR 52.8 03/16/2021    EGFR 52.9 05/28/2020    EGFR 47.4 03/14/2020    EGFR 53.0 12/14/2019    EGFR 53.0 08/08/2019          Stage 3a Chronic Kidney Disease        Vit D:     Lab Results   Component Value Date    VITD 56 05/28/2020    VITD 50 12/14/2019         No results found for: "PTH"    Renal Function Trend:  Lab Results   Component Value Date    CREAT 1.0 03/16/2021    CREAT 1.0 05/28/2020    CREAT 1.1 03/14/2020    CREAT 1.0 12/14/2019    CREAT 1.0 08/08/2019       Lab Results   Component Value Date    EGFR 52.8 03/16/2021    EGFR 52.9 05/28/2020    EGFR 47.4 03/14/2020    EGFR 53.0 12/14/2019    EGFR 53.0 08/08/2019          Hypothyroid    Med:  Levothyroxine (Synthroid) brand only 75 mcg daily (since 1989 when had partial thyroidectomy)  Pt takes synthroid in the AM and waits at least 3-4 hours before she eats breakfast or takes any other vitamins/medications    Compliant with current regimen.  Denies side effects to therapy.  Denies any change in hair/nails/skin, constipation, unexplained wt changes.      Lab Results   Component Value Date    TSH 3.15 03/16/2021    TSH 3.86 11/09/2019    TSH 5.04 (H) 08/08/2019    TSH 0.55 02/12/2019          Mild Intermittent Asthma    Albuterol inh  prn  symbicort  singulair  flonase  Allegra     Pulm: Dr. Illene BolusGranada, pt is due for followup, pt states previous appointment was canceled and she has not rescheduled.     PFT 12/27/2018 - mild obstruction with a moderate decrease in diffusion capacity. No significant response to bronchodilator  Pt is not taking symbicort regularly.  Last used albuterol several weeks ago.  Pt states breathing is improved, back to baseline.     Pt reports doing well, occasional albuterol use, has not used in >1 month.  Has not heard herself wheeze since started symbicort.    03/16/2021- pt states she lost the pulmonary contact information so has not followed up. She also would like a close pulmonary specialist.     05/19/2022- pt reports overdue  for follow-up with pulmonary.  She reports breathing pretty good, once in a while gets congestion which is occasional     Gerd (Gastroesophageal Reflux Disease)    Esomeprazole 20 mg , taking 1 tablet daily, changed to omeprazole 40 mg daily by pulmonary 08/24/2021      Pt is asymptomatic on current medication  No known history of GI ulcer.       Magnesium   Date Value Ref Range Status   03/16/2021 1.9 1.6 - 2.6 mg/dL Final   19/14/7829 1.8 1.6 - 2.6 mg/dL Final   56/21/3086 1.9 1.6 - 2.6 mg/dL Final     Vitamin V-78   Date Value Ref Range Status   03/16/2021 573 211 - 911 pg/mL Final   03/14/2020 749 211 - 911 pg/mL Final   02/12/2019 788 211 - 911 pg/mL Final          Hyperlipidemia     On atorvastatin 40 mg daily      Pt has hyperlipidemia and is compliant with anti-lipidemic therapy.  Pt denies side effects of anti-lipidemic therapy - specifically denies myalgia.        PA: pt does light housekeeping twice a week.  Also walks, especially when weather is nicer    Lab Results   Component Value Date    CHOL 143 03/16/2021    CHOL 165 03/14/2020    CHOL 168 08/08/2019     Lab Results   Component Value Date    HDL 36 (L) 03/16/2021    HDL 36 (L) 03/14/2020    HDL 41 08/08/2019     Lab Results    Component Value Date    LDL 75 03/16/2021    LDL 85 03/14/2020    LDL 93 08/08/2019     Lab Results   Component Value Date    TRIG 161 (H) 03/16/2021    TRIG 222 (H) 03/14/2020    TRIG 169 (H) 08/08/2019          Essential Hypertension    Med:  Amlodipine 10 mg   Toprol Xl 50 mg daily - increase to 100 mg 11/14/2019  Losartan 100 mg daily    Occasional shortness of breath when carrying heavy items.   Pt has long history of dizziness, however not recently  At end of day has very slight swelling in legs.       Pt is compliant with current medication. No adverse effects reported to medication and compliance is reported to be good.   Pt denies CP/chest discomfort, palpitations, syncope or near syncope, orthopnea, or PND.    Home BP:  05/19/2022- pt reports 120s/something by per report, states it is not high at home.     Pt was in the ED on 03/22/2019 for headache and CBC showed normal WBC and kidney function nml creat 1.0    Renal Function Trend:  Lab Results   Component Value Date    CREAT 1.0 03/16/2021    CREAT 1.0 05/28/2020    CREAT 1.1 03/14/2020    CREAT 1.0 12/14/2019    CREAT 1.0 08/08/2019       Lab Results   Component Value Date    EGFR 52.8 03/16/2021    EGFR 52.9 05/28/2020    EGFR 47.4 03/14/2020    EGFR 53.0 12/14/2019    EGFR 53.0 08/08/2019       Lab Results   Component Value Date    WBC 9.92 (H) 03/14/2020    HGB 11.4  03/14/2020    HCT 38.9 03/14/2020    MCV 100.0 (H) 03/14/2020    PLT 361 (H) 03/14/2020          Gout       Flare has occurred on both feet. Has been ~ 5 years ago      On allopurinol 100 mg daily    No interim flare    Lab Results   Component Value Date    URICACID 5.2 03/16/2021    URICACID 5.9 03/14/2020    URICACID 7.3 (H) 11/09/2019    URICACID 6.0 08/08/2019    URICACID 6.1 (H) 02/12/2019              Patient Active Problem List   Diagnosis    Gout    GERD (gastroesophageal reflux disease)    Hyperlipidemia    Essential hypertension    Allergic rhinitis    Mild intermittent asthma     Irregular bowel habits    Hypothyroid    Stage 3a chronic kidney disease    Counseling on health promotion and disease prevention    Osteoporosis     Current Outpatient Medications   Medication Sig Dispense Refill    albuterol sulfate HFA (PROVENTIL) 108 (90 Base) MCG/ACT inhaler INHALE TWO PUFFS BY MOUTH EVERY 4 HOURS AS NEEDED FOR SHORTNESS OF BREATH 8.5 g 1    alendronate (FOSAMAX) 70 MG tablet TAKE 1 TABLET BY MOUTH ONCE WEEKLY ON AN EMPTY STOMACH BEFORE BREAKFAST. REMAIN UPRIGHT FOR 30 MINUTES AND TAKE WITH 8 OUNCES OF WATER 12 tablet 3    allopurinol (ZYLOPRIM) 100 MG tablet TAKE ONE TABLET BY MOUTH DAILY 90 tablet 3    amLODIPine (NORVASC) 10 MG tablet TAKE ONE TABLET BY MOUTH DAILY 90 tablet 3    atorvastatin (LIPITOR) 40 MG tablet TAKE ONE TABLET BY MOUTH DAILY 90 tablet 3    budesonide-formoterol (SYMBICORT) 160-4.5 MCG/ACT inhaler Inhale 2 puffs into the lungs 2 (two) times daily 10.2 g 2    calcium-vitamin D (OSCAL-500 + D) 500-200 MG-UNIT per tablet Take 1 tablet by mouth 2 (two) times daily 600mg  (1500mg ) - 200U tab      clobetasol (TEMOVATE) 0.05 % cream       estradiol (ESTRACE) 0.1 MG/GM vaginal cream       Loratadine (CLARITIN PO) Take 5 mg by mouth daily         losartan (COZAAR) 100 MG tablet TAKE ONE TABLET BY MOUTH DAILY 90 tablet 3    metoprolol succinate (TOPROL-XL) 100 MG 24 hr tablet TAKE ONE TABLET BY MOUTH DAILY 90 tablet 3    montelukast (SINGULAIR) 10 MG tablet TAKE ONE TABLET BY MOUTH EVERY EVENING 90 tablet 3    Omega-3 Fatty Acids (OMEGA-3 FISH OIL) 500 MG Cap Take 2 capsules (1,000 mg) by mouth 2 (two) times daily      omeprazole (PriLOSEC) 40 MG capsule Take 1 capsule (40 mg) by mouth every 24 hours      Synthroid 75 MCG tablet TAKE ONE TABLET BY MOUTH DAILY 90 tablet 1    fluticasone (FLONASE) 50 MCG/ACT nasal spray 2 sprays by Nasal route daily 16 g 5    meclizine (ANTIVERT) 12.5 MG tablet Take 2 tablets (25 mg) by mouth every 8 (eight) hours as needed (Patient not taking:  Reported on 05/19/2022)       No current facility-administered medications for this visit.     Allergies   Allergen Reactions    Penicillins  Patient Care Team:  Jules Husbands, DO as PCP - General (Family Medicine)  Loman Chroman, MD as Consulting Physician (Critical Care Medicine)  [facility] as Consulting Physician (Optometry)    Review of Systems    Objective     BP 139/76 (BP Site: Left arm, Patient Position: Sitting, Cuff Size: Large)   Pulse 73   Temp 98 F (36.7 C)   Resp 20   Ht 1.524 m (5')   Wt 63.8 kg (140 lb 9.6 oz)   BMI 27.46 kg/m   Wt Readings from Last 3 Encounters:   05/19/22 63.8 kg (140 lb 9.6 oz)   09/16/21 65.1 kg (143 lb 8 oz)   05/21/21 65.4 kg (144 lb 3.2 oz)     Physical Exam    Diagnostic Tests:    Assessment & Plan     1. Osteoporosis, unspecified osteoporosis type, unspecified pathological fracture presence  - Dxa Bone Density Axial Skeleton    2. Hyperlipidemia, unspecified hyperlipidemia type  - Lipid panel  - Comprehensive metabolic panel  - Hemoglobin A1C    3. Essential hypertension  - Comprehensive metabolic panel  - CBC and differential    4. Mild intermittent asthma without complication  - fluticasone (FLONASE) 50 MCG/ACT nasal spray; 2 sprays by Nasal route daily  Dispense: 16 g; Refill: 5    5. Hypothyroidism, unspecified type  - TSH, Abn Reflex to Free T4, Serum    6. Stage 3a chronic kidney disease    7. Gout of foot, unspecified cause, unspecified chronicity, unspecified laterality  - Uric acid    8. Gastroesophageal reflux disease, unspecified whether esophagitis present  - Vitamin B12  - Magnesium    9. Allergic rhinitis, unspecified seasonality, unspecified trigger  - fluticasone (FLONASE) 50 MCG/ACT nasal spray; 2 sprays by Nasal route daily  Dispense: 16 g; Refill: 5    Osteoporosis  A/P: Continue alendronate  Will reach out  To clinical pharmacist to assist with patient dosing  Continue calcium and vitamin D  Encourage regular weight bearing  exercise  DEXA ordered    Hyperlipidemia  A/P:   Lab Results   Component Value Date    CHOL 143 03/16/2021    CHOL 165 03/14/2020    TRIG 161 (H) 03/16/2021    TRIG 222 (H) 03/14/2020    HDL 36 (L) 03/16/2021    HDL 36 (L) 03/14/2020    LDL 75 03/16/2021    LDL 85 03/14/2020     Recommend patient consume a healthy diet that emphasizes the intake of vegetables, fruits, nuts, whole grains, lean vegetable or animal protein, and fish and minimizes the intake of trans fats, red meat and process meats, refined carbohydrates, and sugar sweetened beverages.  Prior LDL values within acceptable limits on appropriate intensity statin medication.  Continue current medication therapy.  Obtain lipid indices today.  Continue to optimize a heart healthy diet and aerobic exercise efforts.      Essential hypertension  A/P: Current blood pressure is adequately controlled on the current medication regimen. Continue to optimize low salt diet and aerobic exercise efforts. and Continue current medication regimen. Recommend optimizing therapeutic lifestyle changes which include obtaining at least 150 minutes of aerobic exercise per week and eating a heart healthy diet ( i.e. DASH diet - www.heart.org or PopSteam.is ).  Recommend checking ambulatory blood pressures once or twice a day and document in a log.  Bring the log and blood pressure cuff into the office at the  next follow-up visit or utilize MyChart to communicate BP readings.      Mild intermittent asthma  A/P: rx flonase  continue claritin  Follow-up with pulmonary as soon as possible.     Hypothyroid  A/P:   Lab Results   Component Value Date    TSH 3.15 03/16/2021    T4FREE 1.11 11/09/2019     Pt is clinically euthyroid.  Prior TFT's are within target range. Continue current thyroid replacement medication regimen. Obtain surveillance TFT's today.  Reviewed hypo and hyperthyroid symptoms that warrant sooner evaluation      Gout  A/P: no interim flare  Continue  allopurinol  Check uric acid    GERD (gastroesophageal reflux disease)  A/P: stable  cotninue PPI  Check magnesium and vitamin B12      Advised to ask pharmacy about shingles vaccine ? If cost will be lower since she did not get before due to cost.     Discussed advance directive and encouraged to complete.  Handout given with additional information and form to complete.  Patient advised to contact me if any questions arise and to return the form to the office.    Patient can reference this website for additional information:  [email or web address]    Return in about 4 months (around 09/19/2022) for Medicare Wellness Visit, chronic disease management, Return sooner as needed.

## 2022-05-19 NOTE — Progress Notes (Signed)
Have you seen any specialists/other providers since your last visit with [place]?    Yes, gynecologist, ED     The patient is due for:   Health Maintenance Due   Topic Date Due    Advance Directive on File  Never done    Shingrix Vaccine 50+ (2) 05/13/2002    COVID-19 Vaccine (3 - Booster for Janssen series) 11/20/2020    DEPRESSION SCREENING  03/16/2022

## 2022-05-19 NOTE — Assessment & Plan Note (Signed)
A/P:   Lab Results   Component Value Date    TSH 3.15 03/16/2021    T4FREE 1.11 11/09/2019     Pt is clinically euthyroid.  Prior TFT's are within target range. Continue current thyroid replacement medication regimen. Obtain surveillance TFT's today.  Reviewed hypo and hyperthyroid symptoms that warrant sooner evaluation

## 2022-05-19 NOTE — Assessment & Plan Note (Signed)
A/P: Current blood pressure is adequately controlled on the current medication regimen. Continue to optimize low salt diet and aerobic exercise efforts. and Continue current medication regimen. Recommend optimizing therapeutic lifestyle changes which include obtaining at least 150 minutes of aerobic exercise per week and eating a heart healthy diet ( i.e. DASH diet - www.heart.org or www.nhlbi.nih.gov ).  Recommend checking ambulatory blood pressures once or twice a day and document in a log.  Bring the log and blood pressure cuff into the office at the next follow-up visit or utilize MyChart to communicate BP readings.

## 2022-05-21 NOTE — Progress Notes (Signed)
Called patient, and per Dr. Bauer, advised patient of results. Patient voiced understanding.

## 2022-06-21 ENCOUNTER — Other Ambulatory Visit (INDEPENDENT_AMBULATORY_CARE_PROVIDER_SITE_OTHER): Payer: Self-pay | Admitting: Internal Medicine

## 2022-06-21 DIAGNOSIS — J42 Unspecified chronic bronchitis: Secondary | ICD-10-CM

## 2022-06-22 ENCOUNTER — Other Ambulatory Visit (INDEPENDENT_AMBULATORY_CARE_PROVIDER_SITE_OTHER): Payer: Self-pay | Admitting: Family Medicine

## 2022-06-22 DIAGNOSIS — M109 Gout, unspecified: Secondary | ICD-10-CM

## 2022-07-13 ENCOUNTER — Other Ambulatory Visit (INDEPENDENT_AMBULATORY_CARE_PROVIDER_SITE_OTHER): Payer: Self-pay | Admitting: Family Medicine

## 2022-10-05 ENCOUNTER — Other Ambulatory Visit (INDEPENDENT_AMBULATORY_CARE_PROVIDER_SITE_OTHER): Payer: Self-pay | Admitting: Family Medicine

## 2022-10-28 ENCOUNTER — Encounter (INDEPENDENT_AMBULATORY_CARE_PROVIDER_SITE_OTHER): Payer: Self-pay | Admitting: Family Medicine

## 2022-10-28 ENCOUNTER — Ambulatory Visit (INDEPENDENT_AMBULATORY_CARE_PROVIDER_SITE_OTHER): Payer: Medicare Other | Admitting: Family Medicine

## 2022-10-28 VITALS — BP 173/92 | HR 70 | Temp 97.8°F | Ht 60.0 in | Wt 140.6 lb

## 2022-10-28 DIAGNOSIS — M109 Gout, unspecified: Secondary | ICD-10-CM

## 2022-10-28 DIAGNOSIS — Z0110 Encounter for examination of ears and hearing without abnormal findings: Secondary | ICD-10-CM

## 2022-10-28 DIAGNOSIS — I10 Essential (primary) hypertension: Secondary | ICD-10-CM

## 2022-10-28 DIAGNOSIS — N1831 Chronic kidney disease, stage 3a: Secondary | ICD-10-CM

## 2022-10-28 DIAGNOSIS — E039 Hypothyroidism, unspecified: Secondary | ICD-10-CM

## 2022-10-28 DIAGNOSIS — J4520 Mild intermittent asthma, uncomplicated: Secondary | ICD-10-CM

## 2022-10-28 DIAGNOSIS — E785 Hyperlipidemia, unspecified: Secondary | ICD-10-CM

## 2022-10-28 DIAGNOSIS — K219 Gastro-esophageal reflux disease without esophagitis: Secondary | ICD-10-CM

## 2022-10-28 DIAGNOSIS — M810 Age-related osteoporosis without current pathological fracture: Secondary | ICD-10-CM

## 2022-10-28 LAB — BASIC METABOLIC PANEL
Anion Gap: 9 (ref 5.0–15.0)
BUN: 19 mg/dL (ref 7.0–21.0)
CO2: 24 mEq/L (ref 17–29)
Calcium: 9.8 mg/dL (ref 7.9–10.2)
Chloride: 107 mEq/L (ref 99–111)
Creatinine: 0.9 mg/dL (ref 0.4–1.0)
Glucose: 97 mg/dL (ref 70–100)
Potassium: 4.8 mEq/L (ref 3.5–5.3)
Sodium: 140 mEq/L (ref 135–145)
eGFR: >60 mL/min/{1.73_m2} (ref >=60)

## 2022-10-28 LAB — HEMOLYSIS INDEX: Hemolysis Index: 8 Index (ref 0–24)

## 2022-10-28 NOTE — Assessment & Plan Note (Signed)
A/P:  Continue alendronate  Continue calcium and vitamin D  Encourage regular weight bearing exercise  DEXA ordered last visit, however patient has not completed.  Reminder to complete and copy of order given

## 2022-10-28 NOTE — Assessment & Plan Note (Signed)
A/P: Current blood pressure is above goal. Continue to optimize low salt diet and aerobic exercise efforts. and Continue current medication regimen. Recommend checking ambulatory blood pressures once or twice a day and document in a log.  Bring the log and blood pressure cuff into the office at the next follow-up visit or utilize MyChart to communicate BP readings.  BP in office is elevated.  Patient has not been monitoring at home.  Medication reconciled but still question compliance - advised to check at home that she is taking all that is on current medication list.  Monitor BP at home daily for 2 weeks. Followup in 2 weeks.

## 2022-10-28 NOTE — Assessment & Plan Note (Signed)
A/P:   Lab Results   Component Value Date    CHOL 149 05/19/2022    CHOL 143 03/16/2021    TRIG 159 (H) 05/19/2022    TRIG 161 (H) 03/16/2021    HDL 30 (L) 05/19/2022    HDL 36 (L) 03/16/2021    LDL 87 05/19/2022    LDL 75 03/16/2021     Recommend patient consume a healthy diet that emphasizes the intake of vegetables, fruits, nuts, whole grains, lean vegetable or animal protein, and fish and minimizes the intake of trans fats, red meat and process meats, refined carbohydrates, and sugar sweetened beverages.  Prior LDL values within acceptable limits on appropriate intensity statin medication. Continue current medication therapy.  Repeat lipid indices in 6 months.  Continue to a heart healthy diet and aerobic exercise efforts.

## 2022-10-28 NOTE — Progress Notes (Signed)
Have you seen any specialists/other providers since your last visit with [place]?    Yes, urgent care     The patient is due for:   Health Maintenance Due   Topic Date Due    Advance Directive on File  Never done    Shingrix Vaccine 50+ (2) 05/13/2002    COVID-19 Vaccine (3 - 2023-24 season) 06/25/2022    Medicare Annual Wellness Visit  09/16/2022

## 2022-10-28 NOTE — Assessment & Plan Note (Signed)
A/P:   Lab Results   Component Value Date    TSH 2.19 05/19/2022    T4FREE 1.11 11/09/2019     Pt is clinically euthyroid.  Prior TFT's are within target range. Continue current thyroid replacement medication regimen. Repeat surveillance TFT's in 3-6 months.  Reviewed hypo and hyperthyroid symptoms that warrant sooner evaluation

## 2022-10-28 NOTE — Assessment & Plan Note (Signed)
A/P: no interim flare  Continue low purine diet  Continue allopurinol

## 2022-10-28 NOTE — Assessment & Plan Note (Signed)
A/P: schedule follow-up with pulmonary

## 2022-10-28 NOTE — Assessment & Plan Note (Signed)
A/P: monitor BMP  BP as noted

## 2022-10-28 NOTE — Progress Notes (Signed)
[facility] [place]            Leslie Ray is a 88 y.o. female who presents today for the following Medicare Wellness Visit:  []  Initial Preventive Physical Exam (IPPE) - "Welcome to Medicare" preventive visit (Vision Screening required)   []  Annual Wellness Visit - Initial  [x]  Annual Wellness Visit - Subsequent       Health Risk Assessment:   During the past month, how would you rate your general health?:  Good  Which of the following tasks can you do without assistance - drive or take the bus alone; shop for groceries or clothes; prepare your own meals; do your own housework/laundry; handle your own finances/pay bills; eat, bathe or get around your home?: Handle your own finances/pay bills, Shop for groceries or clothes, Do your own housework/laundry, Prepare your own meals, Eat, bathe, dress or get around your home  Which of the following problems have you been bothered by in the past month - dizzy when standing up; problems using the phone; feeling tired or fatigued; moderate or severe body pain?: Dizzy when standing up, Feeling tired or fatigued, Moderate or severe body pain  Do you exercise for about 20 minutes 3 or more days per week?:Yes  During the past month was someone available to help if you needed and wanted help?  For example, if you felt nervous, lonely, got sick and had to stay in bed, needed someone to talk to, needed help with daily chores or needed help just taking care of yourself.: Yes  Do you always wear a seat belt?: Yes  Do you have any trouble taking medications the way you have been told to take them?: No  Have you been given any information that can help you with keeping track of your medications?: Yes  Do you have trouble paying for your medications?: No  Have you been given any information that can help you with hazards in your house, such as scatter rugs, furniture, etc?: Yes  Do you feel unsteady when standing or walking?: Yes  Do you worry about  falling?: Yes  Have you fallen two or more times in the past year?: No  Did you suffer any injuries from your falls in the past year?: No     Care Team:   Patient Care Team:  Jules Husbands, DO as PCP - General (Family Medicine)  Loman Chroman, MD as Consulting Physician (Critical Care Medicine)  [employer] as Consulting Physician (Optometry)      Hospitalizations:   Hospitalization within past year: [x]  No  []  Yes     Diagnosis:      Screenings:       09/16/2021     8:08 AM 05/19/2022     1:23 PM 10/28/2022     7:53 AM   Ambulatory Screenings   Falls Risk: De [place] more than 2 times in past year Y  N   Falls Risk: Suffer any injuries? Y  N   Depression: PHQ2 Total Score  0    Depression: PHQ9 Total Score  0         Substance Use Disorder Screen:  In the past year, how often have you used the following?  1) Alcohol (For men, 5 or more drinks a day. For women, 4 or more drinks a day)  [x]  Never []  Once or Twice []  Monthly []  Weekly []  Daily or Almost Daily  2) Tobacco Products  [x]  Never []   Once or Twice []  Monthly []  Weekly []  Daily or Almost Daily  3) Prescription Drugs for Non-Medical Reasons  [x]  Never []  Once or Twice []  Monthly []  Weekly []  Daily or Almost Daily  4) Illegal Drugs  [x]  Never []  Once or Twice []  Monthly []  Weekly []  Daily or Almost Daily             Functional Ability/Level of Safety:   Falls Risk/Home Safety Assessment:  ( see HRA and Screenings sections for additional assessment)  Home Safety: [x]  Stair handrails  []  Skid-resistant rugs/remove throw rugs   []  Grab bars  [x]  Clear pathways between rooms  [x]  Proper lighting stairs/ bathrooms/bedrooms  Get Up and Go (optional):  [x]   <20 secs  []   >20 secs    []   High risk for falls - Home Safety/Falls Risk Precautions reviewed with pt/family    Hearing Assessment:  Concerns for hearing loss: [x]  Yes  []   No  Hearing aids:   []   Right  []   Left  [x]   Bilateral   []   None  Whisper Test (optional):  []  Normal  []   Slightly decreased  []    Significantly decreased    Exercise:  Frequency:  [x]   No formal exercise  []   1-2x/wk  [x]   3-4x/wk  []   >4x/wk  Duration:  [x]   15-30 mins/day  []   30-45 mins/day  []   45+ mins/day  Intensity:  [x]   Light  []   Moderate  []   Heavy  Housework.           Activities of Daily Living:   ADL's Independent Minimal  Assistance Moderate  Assistance Total   Assistance   Bathing [x]  []  []  []    Dressing [x]  []  []  []    Mobility   [x]  []  []  []    Transfer [x]  []  []  []    Eating [x]  []  []  []    Toileting [x]  []  []  []      IADL's Independent Minimal  Assistance Moderate  Assistance Total   Assistance   Phone [x]  []  []  []    Housekeeping [x]  []  []  []    Laundry [x]  []  []  []    Transportation []  []  []  [x] Pt does not drive   Medications [x]  []  []  []    Finances [x]  []  []  []       ADL assistance: []  No assistance needed  []  Spouse  []  Sibling  []  Son   [x]  Daughter []  Children  []  Home Health Aide []  Other:       Advance Care Planning:   Discussion of Advance Directives:   []  Advance Directive in chart  [x]  Advance Directive not in chart - requested to provide []  No Advance Directive.  Form Provided  []  No Advance Directive.  Pt declines. []  Not addressed today  []  Other:  Pt has form from previously, however has not completed it yet     Exam:   BP (!) 173/92 (BP Site: Left arm, Patient Position: Sitting, Cuff Size: Medium)   Pulse 70   Temp 97.8 F (36.6 C)   Ht 1.524 m (5')   Wt 63.8 kg (140 lb 9.6 oz)   BMI 27.46 kg/m      Physical Exam          Evaluation of Cognitive Function:   Mood/affect: [x]  Appropriate  []   Other:   Appearance: [x]  Neatly groomed  [x]  Adequately nourished  []  Other:  Family member/caregiver input: []  Present - no concerns  [x]   Not  present in room  []  Present - concerns:    Cognitive Assessment:  Mini-Cog Result (three word registration- banana, sunrise, chair / clock drawing):   []   > 3 points - negative screen for dementia   [x]  3 recalled words - negative screen for dementia   []  1-2 recalled words and  normal clock draw - negative for cognitive impairment   []  1-2 recalled words and abnormal clock draw - positive for cognitive impairment   []  0 recalled words - positive for cognitive impairment         Assessment/Plan:   1. Encounter for hearing examination, unspecified whether abnormal findings  - Ambulatory referral to Audiology; Future    2. Hyperlipidemia, unspecified hyperlipidemia type    3. Essential hypertension    4. Mild intermittent asthma without complication  - Referral to Pulmonary/Critical Care; Future    5. Hypothyroidism, unspecified type    6. Stage 3a chronic kidney disease  - Basic Metabolic Panel    7. Osteoporosis, unspecified osteoporosis type, unspecified pathological fracture presence    8. Gout of foot, unspecified cause, unspecified chronicity, unspecified laterality    9. Gastroesophageal reflux disease, unspecified whether esophagitis present      Recommend covid vaccination    RSV vaccine discussed utilizing informed decision making.  If patient would like to receive the vaccine, advised to get at the local pharmacy as our office does not carry at this time.  Pt states that the cost was too high.     Shingles (Shingrix) vaccine was recommended.  Advised patient get at local pharmacy.  Pt states the cost of the shingrix vaccine was also too costly. Advised to check again with pharmacy as coverage with medicare may have changed.     Education/Counseling:  During the course of the visit, the patient was educated and counseled about appropriate screening and preventive services including:   Shingrix vaccine  COVID vaccine  Bone densitometry screening  Glaucoma screening  Nutrition counseling  Physical activity/exercise counseling  Heart healthy diet counseling  Falls risk prevention and home safety     Jules Husbands, DO    10/28/2022     The following sections were reviewed this encounter by the provider:   Tobacco  Allergies  Meds  Problems  Med Hx  Surg Hx  Fam Hx           History:   Patient Active Problem List   Diagnosis    Gout    GERD (gastroesophageal reflux disease)    Hyperlipidemia    Essential hypertension    Allergic rhinitis    Mild intermittent asthma    Irregular bowel habits    Hypothyroid    Stage 3a chronic kidney disease    Counseling on health promotion and disease prevention    Osteoporosis      Past Medical History:   Diagnosis Date    Allergies     Anemia     Arthritis     Asthma     Bowel habit changes     Chest pain     Disorder of thyroid     Gastroesophageal reflux disease     Gout     Hyperglycemia     Hyperlipidemia     Hypertension     Laxative habit     Murmur     Vertigo      Past Surgical History:   Procedure Laterality Date    BLADDER REPAIR  1999  CATARACT EXTRACTION, BILATERAL  2002    HYSTERECTOMY  1999    THYROIDECTOMY, PARTIAL  1989    tumor removed - patient states was not cancer     Allergies   Allergen Reactions    Penicillins       Outpatient Medications Marked as Taking for the 10/28/22 encounter (Office Visit) with Jules Husbands, DO   Medication Sig Dispense Refill    albuterol sulfate HFA (PROVENTIL) 108 (90 Base) MCG/ACT inhaler INHALE TWO PUFFS BY MOUTH EVERY 4 HOURS AS NEEDED FOR SHORTNESS OF BREATH 8.5 g 1    alendronate (FOSAMAX) 70 MG tablet TAKE 1 TABLET BY MOUTH ONCE WEEKLY ON AN EMPTY STOMACH BEFORE BREAKFAST. REMAIN UPRIGHT FOR 30 MINUTES AND TAKE WITH 8 OUNCES OF WATER 12 tablet 3    allopurinol (ZYLOPRIM) 100 MG tablet TAKE ONE TABLET BY MOUTH DAILY 90 tablet 3    amLODIPine (NORVASC) 10 MG tablet TAKE ONE TABLET BY MOUTH DAILY 90 tablet 3    atorvastatin (LIPITOR) 40 MG tablet TAKE ONE TABLET BY MOUTH DAILY 90 tablet 3    budesonide-formoterol (SYMBICORT) 160-4.5 MCG/ACT inhaler INHALE TWO PUFFS BY MOUTH TWICE A DAY IN THE MORNING AND EVENING 10.2 g 2    calcium-vitamin D (OSCAL-500 + D) 500-200 MG-UNIT per tablet Take 1 tablet by mouth 2 (two) times daily 600mg  (1500mg ) - 200U tab      clobetasol (TEMOVATE) 0.05 %  cream       estradiol (ESTRACE) 0.1 MG/GM vaginal cream       fluticasone (FLONASE) 50 MCG/ACT nasal spray 2 sprays by Nasal route daily 16 g 5    Loratadine (CLARITIN PO) Take 5 mg by mouth daily         losartan (COZAAR) 100 MG tablet TAKE ONE TABLET BY MOUTH DAILY 90 tablet 3    metoprolol succinate (TOPROL-XL) 100 MG 24 hr tablet TAKE ONE TABLET BY MOUTH DAILY 90 tablet 3    montelukast (SINGULAIR) 10 MG tablet TAKE ONE TABLET BY MOUTH EVERY EVENING 90 tablet 3    Omega-3 Fatty Acids (OMEGA-3 FISH OIL) 500 MG Cap Take 2 capsules (1,000 mg) by mouth 2 (two) times daily      omeprazole (PriLOSEC) 40 MG capsule Take 1 capsule (40 mg) by mouth every 24 hours      Synthroid 75 MCG tablet TAKE 1 TABLET BY MOUTH DAILY 90 tablet 1     Social History     Tobacco Use    Smoking status: Never    Smokeless tobacco: Never   Vaping Use    Vaping Use: Never used   Substance Use Topics    Alcohol use: Never    Drug use: Never      Family History   Problem Relation Age of Onset    Emphysema Mother         smoker    Heart attack Father 71    Emphysema Brother            ===================================================================    Additional Documentation:     Chief Complaint:   Chief Complaint   Patient presents with    Medicare Annual Wellness Visit - Subsequent    Hypertension    Osteoporosis    Gout    Hypothyroidism    Hyperlipidemia             HPI:     Problem   Osteoporosis    05/13/2020 DEXA:  IMPRESSION:    Osteoporosis of the  left femoral neck. Osteopenia of the  total left hip and lumbar spine.    Med:  Alendronate 70 mg weekly (rx'd 05/29/2020)  05/19/2022- pt reports sometimesshe skips alendronate because of difficulty coordinating taking with levothyroxine.       Renal Function Trend:  Lab Results   Component Value Date    CREAT 1.0 05/19/2022    CREAT 1.0 03/16/2021    CREAT 1.0 05/28/2020    CREAT 1.1 03/14/2020    CREAT 1.0 12/14/2019     Lab Results   Component Value Date    EGFR 55.0 (A) 05/19/2022     EGFR 52.8 03/16/2021    EGFR 52.9 05/28/2020    EGFR 47.4 03/14/2020    EGFR 53.0 12/14/2019        Stage 3a Chronic Kidney Disease        Vit D:     Lab Results   Component Value Date    VITD 56 05/28/2020    VITD 50 12/14/2019       No results found for: "PTH"    Renal Function Trend:  Lab Results   Component Value Date    CREAT 1.0 05/19/2022    CREAT 1.0 03/16/2021    CREAT 1.0 05/28/2020    CREAT 1.1 03/14/2020    CREAT 1.0 12/14/2019     Lab Results   Component Value Date    EGFR 55.0 (A) 05/19/2022    EGFR 52.8 03/16/2021    EGFR 52.9 05/28/2020    EGFR 47.4 03/14/2020    EGFR 53.0 12/14/2019        Hypothyroid    Med:  Levothyroxine (Synthroid) brand only 75 mcg daily (since 1989 when had partial thyroidectomy)  Pt takes synthroid in the AM and waits at least 3-4 hours before she eats breakfast or takes any other vitamins/medications    Compliant with current regimen.  Denies side effects to therapy.  Denies any change in hair/nails/skin, constipation, unexplained wt changes.      Lab Results   Component Value Date    TSH 2.19 05/19/2022    TSH 3.15 03/16/2021    TSH 3.86 11/09/2019    TSH 5.04 (H) 08/08/2019    TSH 0.55 02/12/2019        Mild Intermittent Asthma    Albuterol inh prn  symbicort  singulair  flonase  Allegra     Pulm: Dr. Illene Bolus, pt is due for followup, pt states previous appointment was canceled and she has not rescheduled.     PFT 12/27/2018 - mild obstruction with a moderate decrease in diffusion capacity. No significant response to bronchodilator  Pt is not taking symbicort regularly.  Last used albuterol several weeks ago.  Pt states breathing is improved, back to baseline.     Pt reports doing well, occasional albuterol use, has not used in >1 month.  Has not heard herself wheeze since started symbicort.    03/16/2021- pt states she lost the pulmonary contact information so has not followed up. She also would like a close pulmonary specialist.     05/19/2022- pt reports overdue for  follow-up with pulmonary.  She reports breathing pretty good, once in a while gets congestion which is occasional    10/28/2022 - overdue for pulmonary follow-up.  Pt states she sometimes forgets the AM dose of symbicort. Denies worsening breathing.  Sometimes short of breath with exercise     Gerd (Gastroesophageal Reflux Disease)    Esomeprazole 20 mg , taking 1  tablet daily, changed to omeprazole 40 mg daily by pulmonary 08/24/2021      Pt is asymptomatic on current medication  No known history of GI ulcer.       Magnesium   Date Value Ref Range Status   05/19/2022 1.8 1.6 - 2.6 mg/dL Final   16/07/9603 1.9 1.6 - 2.6 mg/dL Final   54/06/8118 1.8 1.6 - 2.6 mg/dL Final     Vitamin J-47   Date Value Ref Range Status   05/19/2022 611 211 - 911 pg/mL Final   03/16/2021 573 211 - 911 pg/mL Final   03/14/2020 749 211 - 911 pg/mL Final        Hyperlipidemia     On atorvastatin 40 mg daily      Pt has hyperlipidemia and is compliant with anti-lipidemic therapy.  Pt denies side effects of anti-lipidemic therapy - specifically denies myalgia.        PA: pt does light housekeeping twice a week.  Also walks, especially when weather is nicer    Lab Results   Component Value Date    CHOL 149 05/19/2022    CHOL 143 03/16/2021    CHOL 165 03/14/2020     Lab Results   Component Value Date    HDL 30 (L) 05/19/2022    HDL 36 (L) 03/16/2021    HDL 36 (L) 03/14/2020     Lab Results   Component Value Date    LDL 87 05/19/2022    LDL 75 03/16/2021    LDL 85 03/14/2020     Lab Results   Component Value Date    TRIG 159 (H) 05/19/2022    TRIG 161 (H) 03/16/2021    TRIG 222 (H) 03/14/2020        Essential Hypertension    Med:  Amlodipine 10 mg   Toprol Xl 50 mg daily - increase to 100 mg 11/14/2019  Losartan 100 mg daily    Occasional shortness of breath when carrying heavy items.   Pt has long history of dizziness, however not recently  At end of day has very slight swelling in legs.       Pt is compliant with current medication. No adverse  effects reported to medication and compliance is reported to be good.   Pt denies CP/chest discomfort, palpitations, syncope or near syncope, orthopnea, or PND.    Home BP:  has not been monitoring.     Pt was in the ED on 03/22/2019 for headache and CBC showed normal WBC and kidney function nml creat 1.0    Renal Function Trend:  Lab Results   Component Value Date    CREAT 1.0 05/19/2022    CREAT 1.0 03/16/2021    CREAT 1.0 05/28/2020    CREAT 1.1 03/14/2020    CREAT 1.0 12/14/2019     Lab Results   Component Value Date    EGFR 55.0 (A) 05/19/2022    EGFR 52.8 03/16/2021    EGFR 52.9 05/28/2020    EGFR 47.4 03/14/2020    EGFR 53.0 12/14/2019     Lab Results   Component Value Date    WBC 10.77 (H) 05/19/2022    HGB 11.8 05/19/2022    HCT 35.0 05/19/2022    MCV 91.9 05/19/2022    PLT 358 (H) 05/19/2022        Gout       Flare has occurred on both feet. Has been ~ 5 years ago      On  allopurinol 100 mg daily    No interim flare    Lab Results   Component Value Date    URICACID 5.4 05/19/2022    URICACID 5.2 03/16/2021    URICACID 5.9 03/14/2020    URICACID 7.3 (H) 11/09/2019    URICACID 6.0 08/08/2019                  ROS:             Physical Exam:           Assessment/Plan:   1. Encounter for hearing examination, unspecified whether abnormal findings  - Ambulatory referral to Audiology; Future    2. Hyperlipidemia, unspecified hyperlipidemia type    3. Essential hypertension    4. Mild intermittent asthma without complication  - Referral to Pulmonary/Critical Care; Future    5. Hypothyroidism, unspecified type    6. Stage 3a chronic kidney disease  - Basic Metabolic Panel    7. Osteoporosis, unspecified osteoporosis type, unspecified pathological fracture presence    8. Gout of foot, unspecified cause, unspecified chronicity, unspecified laterality    9. Gastroesophageal reflux disease, unspecified whether esophagitis present    Hyperlipidemia  A/P:   Lab Results   Component Value Date    CHOL 149 05/19/2022    CHOL  143 03/16/2021    TRIG 159 (H) 05/19/2022    TRIG 161 (H) 03/16/2021    HDL 30 (L) 05/19/2022    HDL 36 (L) 03/16/2021    LDL 87 05/19/2022    LDL 75 03/16/2021     Recommend patient consume a healthy diet that emphasizes the intake of vegetables, fruits, nuts, whole grains, lean vegetable or animal protein, and fish and minimizes the intake of trans fats, red meat and process meats, refined carbohydrates, and sugar sweetened beverages.  Prior LDL values within acceptable limits on appropriate intensity statin medication. Continue current medication therapy.  Repeat lipid indices in 6 months.  Continue to a heart healthy diet and aerobic exercise efforts.      Essential hypertension  A/P: Current blood pressure is above goal. Continue to optimize low salt diet and aerobic exercise efforts. and Continue current medication regimen. Recommend checking ambulatory blood pressures once or twice a day and document in a log.  Bring the log and blood pressure cuff into the office at the next follow-up visit or utilize MyChart to communicate BP readings.  BP in office is elevated.  Patient has not been monitoring at home.  Medication reconciled but still question compliance - advised to check at home that she is taking all that is on current medication list.  Monitor BP at home daily for 2 weeks. Followup in 2 weeks.       Mild intermittent asthma  A/P: schedule follow-up with pulmonary    Hypothyroid  A/P:   Lab Results   Component Value Date    TSH 2.19 05/19/2022    T4FREE 1.11 11/09/2019     Pt is clinically euthyroid.  Prior TFT's are within target range. Continue current thyroid replacement medication regimen. Repeat surveillance TFT's in 3-6 months.  Reviewed hypo and hyperthyroid symptoms that warrant sooner evaluation      Stage 3a chronic kidney disease  A/P: monitor BMP  BP as noted    Osteoporosis  A/P:  Continue alendronate  Continue calcium and vitamin D  Encourage regular weight bearing exercise  DEXA ordered  last visit, however patient has not completed.  Reminder to complete and copy  of order given    Gout  A/P: no interim flare  Continue low purine diet  Continue allopurinol    Pt reports chronic headaches, will focus on this and BP at next visit.             Follow-up:   Return in about 2 weeks (around 11/11/2022) for headache and elevated blood pressure, Return sooner as needed.       Jules Husbands, DO

## 2022-11-11 ENCOUNTER — Encounter (INDEPENDENT_AMBULATORY_CARE_PROVIDER_SITE_OTHER): Payer: Self-pay | Admitting: Family Medicine

## 2022-11-11 ENCOUNTER — Ambulatory Visit (INDEPENDENT_AMBULATORY_CARE_PROVIDER_SITE_OTHER): Payer: Medicare Other | Admitting: Family Medicine

## 2022-11-11 VITALS — BP 149/82 | HR 65 | Temp 97.2°F | Ht 60.0 in | Wt 141.6 lb

## 2022-11-11 DIAGNOSIS — R0989 Other specified symptoms and signs involving the circulatory and respiratory systems: Secondary | ICD-10-CM

## 2022-11-11 DIAGNOSIS — R0602 Shortness of breath: Secondary | ICD-10-CM

## 2022-11-11 DIAGNOSIS — I10 Essential (primary) hypertension: Secondary | ICD-10-CM

## 2022-11-11 NOTE — Progress Notes (Signed)
Subjective     Leslie Ray is a 88 y.o. female who presents today for   Chief Complaint   Patient presents with    Hypertension     F/U    Headache     HPI      Problem   Essential Hypertension    Med:  Amlodipine 10 mg   Toprol Xl 50 mg daily - increase to 100 mg 11/14/2019  Losartan 100 mg daily    Occasional shortness of breath when carrying heavy items.   Pt has long history of dizziness, however not recently  At end of day has very slight swelling in legs.       Pt is compliant with current medication. No adverse effects reported to medication and compliance is reported to be good.     11/11/2022 - Pt reports she changed the times she takes her BP medication and does not have the "swimmy" headache that she had before.    Pt reports occasional shortness of breath with daily activities ongoing for years, no notable change.   Pt denies CP/chest discomfort, palpitations, dizziness (resolved), syncope or near syncope, PND or edema.  Pt prefers not to lay flat for years now. She states she feels nasal drainage and short of breath.  She uses an adjustable bed and keeps her head propped up.  This has not changed for years.     Home BP:  log reviewed showed labile BP  117/61 HR 62 to 178/90 HR 67.    Renal Function Trend:  Lab Results   Component Value Date    CREAT 0.9 10/28/2022    CREAT 1.0 05/19/2022    CREAT 1.0 03/16/2021    CREAT 1.0 05/28/2020    CREAT 1.1 03/14/2020     Lab Results   Component Value Date    EGFR >60.0 10/28/2022    EGFR 55.0 (A) 05/19/2022    EGFR 52.8 03/16/2021    EGFR 52.9 05/28/2020    EGFR 47.4 03/14/2020     Lab Results   Component Value Date    WBC 10.77 (H) 05/19/2022    HGB 11.8 05/19/2022    HCT 35.0 05/19/2022    MCV 91.9 05/19/2022    PLT 358 (H) 05/19/2022            Patient Active Problem List   Diagnosis    Gout    GERD (gastroesophageal reflux disease)    Hyperlipidemia    Essential hypertension    Allergic rhinitis    Mild intermittent asthma    Irregular bowel habits     Hypothyroid    Stage 3a chronic kidney disease    Counseling on health promotion and disease prevention    Osteoporosis     Current Outpatient Medications   Medication Sig Dispense Refill    albuterol sulfate HFA (PROVENTIL) 108 (90 Base) MCG/ACT inhaler INHALE TWO PUFFS BY MOUTH EVERY 4 HOURS AS NEEDED FOR SHORTNESS OF BREATH 8.5 g 1    alendronate (FOSAMAX) 70 MG tablet TAKE 1 TABLET BY MOUTH ONCE WEEKLY ON AN EMPTY STOMACH BEFORE BREAKFAST. REMAIN UPRIGHT FOR 30 MINUTES AND TAKE WITH 8 OUNCES OF WATER 12 tablet 3    allopurinol (ZYLOPRIM) 100 MG tablet TAKE ONE TABLET BY MOUTH DAILY 90 tablet 3    amLODIPine (NORVASC) 10 MG tablet TAKE ONE TABLET BY MOUTH DAILY 90 tablet 3    atorvastatin (LIPITOR) 40 MG tablet TAKE ONE TABLET BY MOUTH DAILY 90 tablet 3  budesonide-formoterol (SYMBICORT) 160-4.5 MCG/ACT inhaler INHALE TWO PUFFS BY MOUTH TWICE A DAY IN THE MORNING AND EVENING 10.2 g 2    calcium-vitamin D (OSCAL-500 + D) 500-200 MG-UNIT per tablet Take 1 tablet by mouth 2 (two) times daily 600mg  (1500mg ) - 200U tab      clobetasol (TEMOVATE) 0.05 % cream       estradiol (ESTRACE) 0.1 MG/GM vaginal cream       fluticasone (FLONASE) 50 MCG/ACT nasal spray 2 sprays by Nasal route daily 16 g 5    Loratadine (CLARITIN PO) Take 5 mg by mouth daily         losartan (COZAAR) 100 MG tablet TAKE ONE TABLET BY MOUTH DAILY 90 tablet 3    metoprolol succinate (TOPROL-XL) 100 MG 24 hr tablet TAKE ONE TABLET BY MOUTH DAILY 90 tablet 3    montelukast (SINGULAIR) 10 MG tablet TAKE ONE TABLET BY MOUTH EVERY EVENING 90 tablet 3    Omega-3 Fatty Acids (OMEGA-3 FISH OIL) 500 MG Cap Take 2 capsules (1,000 mg) by mouth 2 (two) times daily      omeprazole (PriLOSEC) 40 MG capsule Take 1 capsule (40 mg) by mouth every 24 hours      Synthroid 75 MCG tablet TAKE 1 TABLET BY MOUTH DAILY 90 tablet 1     No current facility-administered medications for this visit.     Allergies   Allergen Reactions    Penicillins        Patient Care  Team:  Marline Backbone, DO as PCP - General (Family Medicine)  Berna Spare, MD as Consulting Physician ([place])  [facility] as Consulting Physician (Optometry)    Review of Systems    Objective     BP 149/82 (BP Site: Left arm, Patient Position: Sitting, Cuff Size: Medium)   Pulse 65   Temp 97.2 F (36.2 C)   Ht 1.524 m (5')   Wt 64.2 kg (141 lb 9.6 oz)   BMI 27.65 kg/m   Wt Readings from Last 3 Encounters:   11/11/22 64.2 kg (141 lb 9.6 oz)   10/28/22 63.8 kg (140 lb 9.6 oz)   05/19/22 63.8 kg (140 lb 9.6 oz)     Physical Exam    Diagnostic Tests:    Assessment & Plan     1. Labile blood pressure  - Referral to Cardiology: [place] (INTERNAL); Future    2. Essential hypertension  - Referral to Cardiology: [place] (INTERNAL); Future    3. Shortness of breath  - Referral to Cardiology: [place] (INTERNAL); Future      Essential hypertension  A/P:  Patient has labile BP  Continue to optimize low salt diet and aerobic exercise efforts. and Continue current medication regimen. Recommend checking ambulatory blood pressures once or twice a day and document in a log.  Bring the log and blood pressure cuff into the office at the next follow-up visit or utilize MyChart to communicate BP readings.  Due to patient's symptoms and associated CV risk factors, recommend cardiology consult for further diagnostic intervention which may include CVST and/or echocardiogram.      Return in about 3 months (around 02/10/2023) for chronic disease management, Return sooner as needed.

## 2022-11-11 NOTE — Progress Notes (Signed)
Have you seen any specialists/other providers since your last visit with [place]?    No    The patient is due for:   Health Maintenance Due   Topic Date Due    Advance Directive on File  Never done    Shingrix Vaccine 50+ (2) 05/13/2002    COVID-19 Vaccine (3 - 2023-24 season) 06/25/2022

## 2022-11-11 NOTE — Assessment & Plan Note (Signed)
A/P:  Patient has labile BP  Continue to optimize low salt diet and aerobic exercise efforts. and Continue current medication regimen. Recommend checking ambulatory blood pressures once or twice a day and document in a log.  Bring the log and blood pressure cuff into the office at the next follow-up visit or utilize MyChart to communicate BP readings.  Due to patient's symptoms and associated CV risk factors, recommend cardiology consult for further diagnostic intervention which may include CVST and/or echocardiogram.

## 2022-11-16 NOTE — Progress Notes (Signed)
Called pt and advised results per Dr. Bauer. Pt voiced understanding.

## 2022-12-02 ENCOUNTER — Ambulatory Visit (INDEPENDENT_AMBULATORY_CARE_PROVIDER_SITE_OTHER): Payer: Medicare Other | Admitting: Cardiology

## 2022-12-02 ENCOUNTER — Encounter (INDEPENDENT_AMBULATORY_CARE_PROVIDER_SITE_OTHER): Payer: Self-pay | Admitting: Cardiology

## 2022-12-02 VITALS — BP 148/84 | HR 67 | Ht 62.6 in | Wt 141.0 lb

## 2022-12-02 DIAGNOSIS — I10 Essential (primary) hypertension: Secondary | ICD-10-CM

## 2022-12-02 DIAGNOSIS — R0989 Other specified symptoms and signs involving the circulatory and respiratory systems: Secondary | ICD-10-CM

## 2022-12-02 DIAGNOSIS — R0602 Shortness of breath: Secondary | ICD-10-CM

## 2022-12-02 DIAGNOSIS — E785 Hyperlipidemia, unspecified: Secondary | ICD-10-CM

## 2022-12-02 LAB — ECG 12-LEAD
Atrial Rate: 68 {beats}/min
IHS MUSE NARRATIVE AND IMPRESSION: NORMAL
P Axis: 79 degrees
P-R Interval: 180 ms
Q-T Interval: 386 ms
QRS Duration: 76 ms
QTC Calculation (Bezet): 410 ms
R Axis: 4 degrees
T Axis: 9 degrees
Ventricular Rate: 68 {beats}/min

## 2022-12-02 NOTE — Progress Notes (Signed)
Leslie Ray 88 y.o. with a history of hypertension presents for initial cardiac evaluation of hypertension and exertional dyspnea.    Hypertension: Diagnosed with hypertension in her 68s.  Current regimen includes Toprol-XL 100 mg daily, losartan 100 mg daily and amlodipine 10 mg daily.  Personally reviewed home blood pressure recordings which notes a variable blood pressure control.  She does not watch her salt intake.    Chronic exertional dyspnea.  Denies orthopnea or lower extremity edema.  Denies chest pain.  Intermittent episodes of palpitations.  Denies syncope    Past Medical History:   Diagnosis Date    Allergies     Anemia     Arthritis     Asthma     Bowel habit changes     Chest pain     Disorder of thyroid     Gastroesophageal reflux disease     Gout     Hyperglycemia     Hyperlipidemia     Hypertension     Laxative habit     Murmur     Vertigo      Family History   Problem Relation Age of Onset    Emphysema Mother         smoker    Heart attack Father 105    Emphysema Brother      Current Outpatient Medications   Medication Sig Dispense Refill    albuterol sulfate HFA (PROVENTIL) 108 (90 Base) MCG/ACT inhaler INHALE TWO PUFFS BY MOUTH EVERY 4 HOURS AS NEEDED FOR SHORTNESS OF BREATH 8.5 g 1    alendronate (FOSAMAX) 70 MG tablet TAKE 1 TABLET BY MOUTH ONCE WEEKLY ON AN EMPTY STOMACH BEFORE BREAKFAST. REMAIN UPRIGHT FOR 30 MINUTES AND TAKE WITH 8 OUNCES OF WATER 12 tablet 3    allopurinol (ZYLOPRIM) 100 MG tablet TAKE ONE TABLET BY MOUTH DAILY 90 tablet 3    amLODIPine (NORVASC) 10 MG tablet TAKE ONE TABLET BY MOUTH DAILY 90 tablet 3    atorvastatin (LIPITOR) 40 MG tablet TAKE ONE TABLET BY MOUTH DAILY 90 tablet 3    budesonide-formoterol (SYMBICORT) 160-4.5 MCG/ACT inhaler INHALE TWO PUFFS BY MOUTH TWICE A DAY IN THE MORNING AND EVENING 10.2 g 2    calcium-vitamin D (OSCAL-500 + D) 500-200 MG-UNIT per tablet Take 1 tablet by mouth 2 (two) times daily 600mg  (1500mg ) - 200U tab      clobetasol  (TEMOVATE) 0.05 % cream       estradiol (ESTRACE) 0.1 MG/GM vaginal cream       fluticasone (FLONASE) 50 MCG/ACT nasal spray 2 sprays by Nasal route daily 16 g 5    Loratadine (CLARITIN PO) Take 5 mg by mouth daily         losartan (COZAAR) 100 MG tablet TAKE ONE TABLET BY MOUTH DAILY 90 tablet 3    metoprolol succinate (TOPROL-XL) 100 MG 24 hr tablet TAKE ONE TABLET BY MOUTH DAILY 90 tablet 3    montelukast (SINGULAIR) 10 MG tablet TAKE ONE TABLET BY MOUTH EVERY EVENING 90 tablet 3    Omega-3 Fatty Acids (OMEGA-3 FISH OIL) 500 MG Cap Take 2 capsules (1,000 mg) by mouth 2 (two) times daily      omeprazole (PriLOSEC) 40 MG capsule Take 1 capsule (40 mg) by mouth every 24 hours      Synthroid 75 MCG tablet TAKE 1 TABLET BY MOUTH DAILY 90 tablet 1     No current facility-administered medications for this visit.  Review of Systems:      Otherwise 10 point review of systems is negative.    PE:    Vitals:    12/02/22 0816   BP: 148/84   Pulse: 67   SpO2:      Body mass index is 25.3 kg/m.    General:  Patient appears their stated age, well-nourished.  Alert and in no apparent distress.  Eyes: No conjunctivitis, no purulent discharge  ENT: Lips moist,   Respiratory: Clear to auscultation and percussion throughout. Respiratory effort unlabored, chest expansion symmetric.    Cardio: Regular rate and rhythm.  No carotid bruits or thrills, no JVD.  Extremities: warm, pulses 2+, no peripheral edema  GI: Soft, nondistended, nontender.  No guarding or rebound.  Skin: Skin warm, dry, and intact  Psychiatric: Normal mood and affect    EKG:    Normal sinus rhythm.  Nonspecific ST abnormality    Labs:  Lipid Panel   Cholesterol   Date/Time Value Ref Range Status   05/19/2022 01:45 PM 149 0 - 199 mg/dL Final     Triglycerides   Date/Time Value Ref Range Status   05/19/2022 01:45 PM 159 (H) 34 - 149 mg/dL Final     HDL   Date/Time Value Ref Range Status   05/19/2022 01:45 PM 30 (L) 40 - 9,999 mg/dL Final     Comment:     An HDL  cholesterol <40 mg/dL is low and constitutes a  coronary heart disease risk factor, and HDL-C>59 mg/dL is  a negative risk factor for CHD.  Ref: American Heart Association; Circulation 2004         CMP:   Sodium   Date/Time Value Ref Range Status   10/28/2022 08:39 AM 140 135 - 145 mEq/L Final     Potassium   Date/Time Value Ref Range Status   10/28/2022 08:39 AM 4.8 3.5 - 5.3 mEq/L Final     Chloride   Date/Time Value Ref Range Status   10/28/2022 08:39 AM 107 99 - 111 mEq/L Final     CO2   Date/Time Value Ref Range Status   10/28/2022 08:39 AM 24 17 - 29 mEq/L Final     Glucose   Date/Time Value Ref Range Status   10/28/2022 08:39 AM 97 70 - 100 mg/dL Final     Comment:     ADA guidelines for diabetes mellitus:  Fasting:  Equal to or greater than 126 mg/dL  Random:   Equal to or greater than 200 mg/dL       BUN   Date/Time Value Ref Range Status   10/28/2022 08:39 AM 19.0 7.0 - 21.0 mg/dL Final     Protein, Total   Date/Time Value Ref Range Status   05/19/2022 01:45 PM 7.1 6.0 - 8.3 g/dL Final     Alkaline Phosphatase   Date/Time Value Ref Range Status   05/19/2022 01:45 PM 66 37 - 117 U/L Final     AST (SGOT)   Date/Time Value Ref Range Status   05/19/2022 01:45 PM 22 5 - 41 U/L Final     ALT   Date/Time Value Ref Range Status   05/19/2022 01:45 PM 16 0 - 55 U/L Final     Anion Gap   Date/Time Value Ref Range Status   10/28/2022 08:39 AM 9.0 5.0 - 15.0 Final     Comment:     Calculated AGAP = Na - (CL + CO2)  Interpret with caution; calculated AGAP may  not  reflect patient's true clinical status.  This is a calculated value and platform-dependent.  A value >12.0 has been recommended for the management of  Hyperglycemic Crises: Diabetic Ketoacidosis and Hyperglycemic  Hyperosmolar State. Med Clin [place]. 2017;101(3):587-606.  doi:10.1016/j.mcna.2016.12.011         CBC:   WBC   Date/Time Value Ref Range Status   05/19/2022 01:45 PM 10.77 (H) 3.10 - 9.50 x10 3/uL Final     RBC   Date/Time Value Ref Range Status    05/19/2022 01:45 PM 3.81 (L) 3.90 - 5.10 x10 6/uL Final     Hgb   Date/Time Value Ref Range Status   05/19/2022 01:45 PM 11.8 11.4 - 14.8 g/dL Final     Hematocrit   Date/Time Value Ref Range Status   05/19/2022 01:45 PM 35.0 34.7 - 43.7 % Final     MCV   Date/Time Value Ref Range Status   05/19/2022 01:45 PM 91.9 78.0 - 96.0 fL Final     MCHC   Date/Time Value Ref Range Status   05/19/2022 01:45 PM 33.7 31.5 - 35.8 g/dL Final     RDW   Date/Time Value Ref Range Status   05/19/2022 01:45 PM 13 11 - 15 % Final     Platelets   Date/Time Value Ref Range Status   05/19/2022 01:45 PM 358 (H) 142 - 346 x10 3/uL Final           Impression / plan    Hypertension: Variable home blood pressure recordings.  Reviewed home blood pressures in 1 instance well-controlled but usually in the evenings becomes a little bit elevated.  Stressed importance of decreasing her sodium intake.  Will continue to follow closely.  In the meantime continue current regimen of losartan, amlodipine, and Toprol-XL.    Exertional dyspnea: Will schedule echocardiogram rule out structural heart disease.  Will schedule Lexiscan stress test to rule out significant coronary artery disease.    Follow-up with cardiology upon completion of above studies      Verlan Friends, MD  12/02/2022

## 2022-12-07 ENCOUNTER — Encounter (INDEPENDENT_AMBULATORY_CARE_PROVIDER_SITE_OTHER): Payer: Self-pay | Admitting: Family Medicine

## 2022-12-30 ENCOUNTER — Ambulatory Visit (INDEPENDENT_AMBULATORY_CARE_PROVIDER_SITE_OTHER): Payer: Self-pay

## 2023-01-08 ENCOUNTER — Other Ambulatory Visit (INDEPENDENT_AMBULATORY_CARE_PROVIDER_SITE_OTHER): Payer: Self-pay | Admitting: Family Medicine

## 2023-01-11 ENCOUNTER — Ambulatory Visit (INDEPENDENT_AMBULATORY_CARE_PROVIDER_SITE_OTHER): Payer: Medicare Other

## 2023-01-11 DIAGNOSIS — R0602 Shortness of breath: Secondary | ICD-10-CM

## 2023-01-12 LAB — ECHO ADULT TTE COMPLETE
AV Area (Cont Eq VTI): 2.04
AV Area (Cont Eq VTI): 2.047
AV Mean Gradient: 6
AV Peak Velocity: 1.6
Ao Root Diameter (2D): 3.4
Ao Root Diameter (2D): 3.4
IVS Diastolic Thickness (2D): 1.24
LA Dimension (2D): 4.6
LA Volume Index (BP A-L): 38
LVID diastole (2D): 4.56
LVID systole (2D): 2.89
MV Area (PHT): 3.122
MV E/A: 1.457
MV E/A: 1.5
MV E/e' (Average): 15.877
Mitral Valve Findings: NORMAL
Prox Ascending Aorta Diameter: 3.3
Pulmonary Valve Findings: NORMAL
RV Basal Diastolic Dimension: 3.59
RV Function: NORMAL
RV Systolic Pressure: 31.302
Site RA Size (AS): NORMAL
Site RV Size (AS): NORMAL
Summary: NORMAL
Summary: NORMAL
Summary: NORMAL
TAPSE: 2.02
Tricuspid Valve Findings: NORMAL

## 2023-01-14 ENCOUNTER — Telehealth (INDEPENDENT_AMBULATORY_CARE_PROVIDER_SITE_OTHER): Payer: Self-pay

## 2023-01-14 NOTE — Telephone Encounter (Signed)
I spoke to the patient about her ECHOCARDIOGRAM results :    Normal heart function, moderate valve leakage , not serious. Reg f/u 02/2023 with Tamala Julian

## 2023-02-04 ENCOUNTER — Ambulatory Visit (INDEPENDENT_AMBULATORY_CARE_PROVIDER_SITE_OTHER): Payer: Medicare Other

## 2023-02-04 ENCOUNTER — Encounter (INDEPENDENT_AMBULATORY_CARE_PROVIDER_SITE_OTHER): Payer: Self-pay | Admitting: Family Medicine

## 2023-02-04 ENCOUNTER — Encounter (INDEPENDENT_AMBULATORY_CARE_PROVIDER_SITE_OTHER): Payer: Self-pay

## 2023-02-04 DIAGNOSIS — R0602 Shortness of breath: Secondary | ICD-10-CM

## 2023-02-04 LAB — NM MYOCARDIAL PERFUSION SPECT W STRESS AND REST: Stress LV Ejection Fraction: 83

## 2023-02-04 MED ORDER — TECHNETIUM TC 99M TETROFOSMIN IV KIT
10.0000 | PACK | Freq: Once | INTRAVENOUS | Status: AC | PRN
Start: 2023-02-04 — End: 2023-02-04
  Administered 2023-02-04: 10 via INTRAVENOUS

## 2023-02-04 MED ORDER — REGADENOSON 0.4 MG/5ML IV SOLN
0.4000 mg | Freq: Once | INTRAVENOUS | Status: AC | PRN
Start: 2023-02-04 — End: 2023-02-04
  Administered 2023-02-04: 0.4 mg via INTRAVENOUS
  Filled 2023-02-04: qty 5

## 2023-02-04 MED ORDER — TECHNETIUM TC 99M TETROFOSMIN IV KIT
35.0000 | PACK | Freq: Once | INTRAVENOUS | Status: AC | PRN
Start: 2023-02-04 — End: 2023-02-04
  Administered 2023-02-04: 35 via INTRAVENOUS

## 2023-02-09 ENCOUNTER — Ambulatory Visit (INDEPENDENT_AMBULATORY_CARE_PROVIDER_SITE_OTHER): Payer: Medicare Other | Admitting: Family Medicine

## 2023-02-09 ENCOUNTER — Encounter (INDEPENDENT_AMBULATORY_CARE_PROVIDER_SITE_OTHER): Payer: Self-pay | Admitting: Family Medicine

## 2023-02-09 VITALS — BP 157/72 | HR 61 | Temp 97.8°F | Ht 61.0 in | Wt 142.0 lb

## 2023-02-09 DIAGNOSIS — M810 Age-related osteoporosis without current pathological fracture: Secondary | ICD-10-CM

## 2023-02-09 DIAGNOSIS — K649 Unspecified hemorrhoids: Secondary | ICD-10-CM

## 2023-02-09 DIAGNOSIS — I10 Essential (primary) hypertension: Secondary | ICD-10-CM

## 2023-02-09 DIAGNOSIS — M545 Low back pain, unspecified: Secondary | ICD-10-CM

## 2023-02-09 MED ORDER — HYDROCORTISONE ACETATE 25 MG RE SUPP
25.0000 mg | Freq: Two times a day (BID) | RECTAL | 0 refills | Status: AC
Start: 2023-02-09 — End: ?

## 2023-02-09 MED ORDER — POLYETHYLENE GLYCOL 3350 17 GM/SCOOP PO POWD
17.0000 g | Freq: Every day | ORAL | 0 refills | Status: AC
Start: 2023-02-09 — End: ?

## 2023-02-09 MED ORDER — HYDROCORTISONE (PERIANAL) 2.5 % EX CREA
TOPICAL_CREAM | Freq: Three times a day (TID) | CUTANEOUS | 0 refills | Status: AC
Start: 2023-02-09 — End: ?

## 2023-02-09 NOTE — Progress Notes (Signed)
Have you seen any specialists/other providers since your last visit with us?    No    The patient is due for:   Health Maintenance Due   Topic Date Due    Advance Directive on File  Never done    Shingrix Vaccine 50+ (2) 05/13/2002    COVID-19 Vaccine (3 - 2023-24 season) 06/25/2022

## 2023-02-09 NOTE — Progress Notes (Signed)
Subjective     Leslie Ray is a 88 y.o. female who presents today for   Chief Complaint   Patient presents with    Hypertension    Back Pain     Hypertension    Back Pain      Pt c/o low back pain for the past 3 weeks.  Pt states is improving.  Pain does not radiate.  Denies numbness/tingling/weakness.  Pain does not radiate.  Worse when getting up and down from a chair.  Has taken a few ibuprofen. Also using an OTC arthritic topical rub.     Pt also c/o hemorrhoid.  Occasional itch.    Using OTC hemorrhoid cream but doesn't feel like it is helping,   Pt denies any bleeding.  Pt report intermittent constipation.      Pt provided 4 day home BP log     133/74 HR 71    149/74 HR 67    117/65  HR 71    106/60 HR 50  Problem   Osteoporosis    05/13/2020 DEXA:  IMPRESSION:    Osteoporosis of the left femoral neck. Osteopenia of the  total left hip and lumbar spine.    Med:  Alendronate 70 mg weekly (rx'd 05/29/2020)  05/19/2022- pt reports sometimesshe skips alendronate because of difficulty coordinating taking with levothyroxine.       Renal Function Trend:  Lab Results   Component Value Date    CREAT 1.0 05/19/2022    CREAT 1.0 03/16/2021    CREAT 1.0 05/28/2020    CREAT 1.1 03/14/2020    CREAT 1.0 12/14/2019       Lab Results   Component Value Date    EGFR 55.0 (A) 05/19/2022    EGFR 52.8 03/16/2021    EGFR 52.9 05/28/2020    EGFR 47.4 03/14/2020    EGFR 53.0 12/14/2019          Essential Hypertension    Med:  Amlodipine 10 mg   Toprol Xl 50 mg daily - increase to 100 mg 11/14/2019  Losartan 100 mg daily    02/04/2023:  NM MYOCARDIAL PERFUSION SPECT W STRESS AND REST   Summary    1. No significant ischemia nor infarct seen.    2. Normal wall motion and systolic function, calculated EF greater than 70%.    3. Worsening of baseline inferolateral ST changes suggestive of ischemia.    4. Isolated PVCs.    5. No prior studies are available for comparison.     01/11/2023: ECHO ADULT TTE COMPLETE   Summary    * The left  ventricle is normal in size.    * Left ventricular ejection fraction is normal with an estimated ejection  fraction of 60-65%.    * The right ventricular cavity size is normal in size.    * Normal right ventricular systolic function.    * There is moderate thickening and calcification of the aortic leaflets.    * There is mild to moderate mitral regurgitation.    * There is mild tricuspid regurgitation.    * No pulmonary hypertension with estimated right ventricular systolic  pressure of  31 mmHg.    * There is evidence of aortic plaque in the aortic root.    Occasional shortness of breath when carrying heavy items.   Pt has long history of dizziness, however not recently  At end of day has very slight swelling in legs.       Pt  is compliant with current medication. No adverse effects reported to medication and compliance is reported to be good.     11/11/2022 - Pt reports she changed the times she takes her BP medication and does not have the "swimmy" headache that she had before.    Pt reports occasional shortness of breath with daily activities ongoing for years, no notable change.   Pt denies CP/chest discomfort, palpitations, dizziness (resolved), syncope or near syncope, PND or edema.  Pt prefers not to lay flat for years now. She states she feels nasal drainage and short of breath.  She uses an adjustable bed and keeps her head propped up.  This has not changed for years.     Home BP:  log reviewed showed labile BP  117/61 HR 62 to 178/90 HR 67.    Renal Function Trend:  Lab Results   Component Value Date    CREAT 0.9 10/28/2022    CREAT 1.0 05/19/2022    CREAT 1.0 03/16/2021    CREAT 1.0 05/28/2020    CREAT 1.1 03/14/2020       Lab Results   Component Value Date    EGFR >60.0 10/28/2022    EGFR 55.0 (A) 05/19/2022    EGFR 52.8 03/16/2021    EGFR 52.9 05/28/2020    EGFR 47.4 03/14/2020       Lab Results   Component Value Date    WBC 10.77 (H) 05/19/2022    HGB 11.8 05/19/2022    HCT 35.0 05/19/2022    MCV 91.9  05/19/2022    PLT 358 (H) 05/19/2022              Patient Active Problem List   Diagnosis    Gout    GERD (gastroesophageal reflux disease)    Hyperlipidemia    Essential hypertension    Allergic rhinitis    Mild intermittent asthma    Irregular bowel habits    Hypothyroid    Stage 3a chronic kidney disease    Counseling on health promotion and disease prevention    Osteoporosis     Current Outpatient Medications   Medication Sig Dispense Refill    albuterol sulfate HFA (PROVENTIL) 108 (90 Base) MCG/ACT inhaler INHALE TWO PUFFS BY MOUTH EVERY 4 HOURS AS NEEDED FOR SHORTNESS OF BREATH 8.5 g 1    alendronate (FOSAMAX) 70 MG tablet TAKE 1 TABLET BY MOUTH ONCE WEEKLY ON AN EMPTY STOMACH BEFORE BREAKFAST. REMAIN UPRIGHT FOR 30 MINUTES AND TAKE WITH 8 OUNCES OF WATER 12 tablet 3    allopurinol (ZYLOPRIM) 100 MG tablet TAKE ONE TABLET BY MOUTH DAILY 90 tablet 3    amLODIPine (NORVASC) 10 MG tablet TAKE ONE TABLET BY MOUTH DAILY 100 tablet 3    atorvastatin (LIPITOR) 40 MG tablet TAKE ONE TABLET BY MOUTH DAILY 90 tablet 3    budesonide-formoterol (SYMBICORT) 160-4.5 MCG/ACT inhaler INHALE TWO PUFFS BY MOUTH TWICE A DAY IN THE MORNING AND EVENING 10.2 g 2    calcium-vitamin D (OSCAL-500 + D) 500-200 MG-UNIT per tablet Take 1 tablet by mouth 2 (two) times daily 600mg  (1500mg ) - 200U tab      clobetasol (TEMOVATE) 0.05 % cream       estradiol (ESTRACE) 0.1 MG/GM vaginal cream       fluticasone (FLONASE) 50 MCG/ACT nasal spray 2 sprays by Nasal route daily 16 g 5    Loratadine (CLARITIN PO) Take 5 mg by mouth daily         losartan (COZAAR)  100 MG tablet TAKE ONE TABLET BY MOUTH DAILY 90 tablet 3    metoprolol succinate (TOPROL-XL) 100 MG 24 hr tablet TAKE ONE TABLET BY MOUTH DAILY 90 tablet 3    montelukast (SINGULAIR) 10 MG tablet TAKE ONE TABLET BY MOUTH EVERY EVENING 90 tablet 3    Omega-3 Fatty Acids (OMEGA-3 FISH OIL) 500 MG Cap Take 2 capsules (1,000 mg) by mouth 2 (two) times daily      omeprazole (PriLOSEC) 40 MG  capsule Take 1 capsule (40 mg) by mouth every 24 hours      Synthroid 75 MCG tablet TAKE 1 TABLET BY MOUTH DAILY 90 tablet 1    hydrocortisone (ANUSOL-HC) 2.5 % rectal cream Place rectally 3 (three) times daily 30 g 0    hydrocortisone (ANUSOL-HC) 25 MG suppository Place 1 suppository (25 mg) rectally 2 (two) times daily 30 suppository 0    polyethylene glycol (MIRALAX) 17 GM/SCOOP powder Take 17 g by mouth daily 225 g 0     No current facility-administered medications for this visit.     Allergies   Allergen Reactions    Penicillins        Patient Care Team:  Jules Husbands, DO as PCP - General (Family Medicine)  Loman Chroman, MD as Consulting Physician (Critical Care Medicine)  [facility] as Consulting Physician ([facility])  Nelda Bucks, MD as Consulting Physician (Cardiology)    Review of Systems   Musculoskeletal:  Positive for back pain.       Objective     BP 157/72 (BP Site: Left arm, Patient Position: Sitting, Cuff Size: Medium)   Pulse 61   Temp 97.8 F (36.6 C)   Ht 1.549 m (5\' 1" )   Wt 64.4 kg (142 lb)   BMI 26.83 kg/m   Wt Readings from Last 3 Encounters:   02/09/23 64.4 kg (142 lb)   02/04/23 64 kg (141 lb)   12/02/22 64 kg (141 lb)     Physical Exam  Musculoskeletal:      Lumbar back: No deformity, tenderness or bony tenderness. Decreased range of motion. Negative right straight leg raise test and negative left straight leg raise test.      Comments: Muscle strength  Hip flexors 5/5  Knee extension 5/5  Knee flexion 5/5  Foot dorsi/plantar flexion 5/5  Gait not antalgic and no assistance required with ambulation           Diagnostic Tests:    Assessment & Plan     1. Essential hypertension    2. Acute bilateral low back pain without sciatica  - X-ray lumbar spine AP and lateral    3. Osteoporosis, unspecified osteoporosis type, unspecified pathological fracture presence  - X-ray lumbar spine AP and lateral    4. Hemorrhoids, unspecified hemorrhoid type  - hydrocortisone  (ANUSOL-HC) 25 MG suppository; Place 1 suppository (25 mg) rectally 2 (two) times daily  Dispense: 30 suppository; Refill: 0  - hydrocortisone (ANUSOL-HC) 2.5 % rectal cream; Place rectally 3 (three) times daily  Dispense: 30 g; Refill: 0  - polyethylene glycol (MIRALAX) 17 GM/SCOOP powder; Take 17 g by mouth daily  Dispense: 225 g; Refill: 0    Essential hypertension  A/P:  Pt with labile BP  Continue to optimize low salt diet and aerobic exercise efforts. and Continue current medication regimen. Recommend optimizing therapeutic lifestyle changes which include obtaining at least 150 minutes of aerobic exercise per week and eating a heart healthy diet ( i.e. DASH  diet - www.heart.org or PopSteam.is ).  Recommend checking ambulatory blood pressures once or twice a day and document in a log.  Bring the log and blood pressure cuff into the office at the next follow-up visit or utilize MyChart to communicate BP readings.  Has upcoming follow-up with cardiology  Had recent stress test and echo which we did review and discuss today      Osteoporosis  A/P: Bone density is pending  New back pain, will check x-ray  Short term use of ibuprofen is okay  400-600 mg every 6 hours PRN    Shingles (Shingrix) vaccine was recommended.  Advised patient get at local pharmacy.    Recommend covid vaccination    RSV vaccine discussed utilizing informed decision making.  If patient would like to receive the vaccine, advised to get at the local pharmacy as our office does not carry at this time.    Return in about 4 months (around 06/11/2023) for chronic condition follow-up, Return sooner as needed.

## 2023-02-09 NOTE — Assessment & Plan Note (Signed)
A/P:  Pt with labile BP  Continue to optimize low salt diet and aerobic exercise efforts. and Continue current medication regimen. Recommend optimizing therapeutic lifestyle changes which include obtaining at least 150 minutes of aerobic exercise per week and eating a heart healthy diet ( i.e. DASH diet - www.heart.org or PopSteam.is ).  Recommend checking ambulatory blood pressures once or twice a day and document in a log.  Bring the log and blood pressure cuff into the office at the next follow-up visit or utilize MyChart to communicate BP readings.  Has upcoming follow-up with cardiology  Had recent stress test and echo which we did review and discuss today

## 2023-02-09 NOTE — Assessment & Plan Note (Signed)
A/P: Bone density is pending  New back pain, will check x-ray

## 2023-02-10 ENCOUNTER — Telehealth (INDEPENDENT_AMBULATORY_CARE_PROVIDER_SITE_OTHER): Payer: Self-pay

## 2023-02-10 NOTE — Telephone Encounter (Signed)
Nuclear stress test was normal.

## 2023-02-16 ENCOUNTER — Other Ambulatory Visit (INDEPENDENT_AMBULATORY_CARE_PROVIDER_SITE_OTHER): Payer: Self-pay | Admitting: Family Medicine

## 2023-03-03 ENCOUNTER — Encounter (INDEPENDENT_AMBULATORY_CARE_PROVIDER_SITE_OTHER): Payer: Self-pay | Admitting: Cardiology

## 2023-03-03 ENCOUNTER — Ambulatory Visit (INDEPENDENT_AMBULATORY_CARE_PROVIDER_SITE_OTHER): Payer: Medicare Other | Admitting: Cardiology

## 2023-03-03 VITALS — BP 159/75 | HR 71 | Temp 97.2°F | Ht 61.0 in | Wt 140.0 lb

## 2023-03-03 DIAGNOSIS — E785 Hyperlipidemia, unspecified: Secondary | ICD-10-CM

## 2023-03-03 DIAGNOSIS — I10 Essential (primary) hypertension: Secondary | ICD-10-CM

## 2023-03-03 NOTE — Progress Notes (Signed)
Leslie Ray 88 y.o. with a history of hypertension presents for initial cardiac evaluation of hypertension and exertional dyspnea.    Hypertension: Diagnosed with hypertension in her 66s.  Current regimen includes Toprol-XL 100 mg daily, losartan 100 mg daily and amlodipine 10 mg daily.  She has recorded her blood pressures at home since last office visit.    Chronic exertional dyspnea.  Denies orthopnea or lower extremity edema.  Denies chest pain.  Intermittent episodes of palpitations.  Denies syncope.  Dyspnea is been ongoing over 5 years.  She has seen a pulmonologist in the past    Cardiac workup is included    Stress test (02/04/2023): Normal myocardial perfusion study.  No evidence of ischemia or infarct.    Echocardiogram (01/11/2023): Normal LV/RV size and systolic function.  Ejection fraction 65%.  Mild to moderate mitral regurgitation.  Mild tricuspid regurgitation.        Current Outpatient Medications   Medication Sig Dispense Refill    acetaminophen (TYLENOL) 160 MG chewable tablet Chew 1 tablet (160 mg) by mouth every 6 (six) hours as needed for Pain      albuterol sulfate HFA (PROVENTIL) 108 (90 Base) MCG/ACT inhaler INHALE TWO PUFFS BY MOUTH EVERY 4 HOURS AS NEEDED FOR SHORTNESS OF BREATH 8.5 g 1    alendronate (FOSAMAX) 70 MG tablet TAKE 1 TABLET BY MOUTH ONCE WEEKLY ON AN EMPTY STOMACH BEFORE BREAKFAST. REMAIN UPRIGHT FOR 30 MINUTES AND TAKE WITH 8 OUNCES OF WATER 12 tablet 3    allopurinol (ZYLOPRIM) 100 MG tablet TAKE ONE TABLET BY MOUTH DAILY 90 tablet 3    amLODIPine (NORVASC) 10 MG tablet TAKE ONE TABLET BY MOUTH DAILY 100 tablet 3    atorvastatin (LIPITOR) 40 MG tablet TAKE ONE TABLET BY MOUTH DAILY 90 tablet 3    budesonide-formoterol (SYMBICORT) 160-4.5 MCG/ACT inhaler INHALE TWO PUFFS BY MOUTH TWICE A DAY IN THE MORNING AND EVENING 10.2 g 2    calcium-vitamin D (OSCAL-500 + D) 500-200 MG-UNIT per tablet Take 1 tablet by mouth 2 (two) times daily 600mg  (1500mg ) - 200U tab       clobetasol (TEMOVATE) 0.05 % cream       estradiol (ESTRACE) 0.1 MG/GM vaginal cream       fluticasone (FLONASE) 50 MCG/ACT nasal spray 2 sprays by Nasal route daily 16 g 5    hydrocortisone (ANUSOL-HC) 2.5 % rectal cream Place rectally 3 (three) times daily 30 g 0    hydrocortisone (ANUSOL-HC) 25 MG suppository Place 1 suppository (25 mg) rectally 2 (two) times daily 30 suppository 0    Loratadine (CLARITIN PO) Take 5 mg by mouth daily         losartan (COZAAR) 100 MG tablet TAKE ONE TABLET BY MOUTH DAILY 100 tablet 3    metoprolol succinate (TOPROL-XL) 100 MG 24 hr tablet TAKE ONE TABLET BY MOUTH DAILY 90 tablet 3    montelukast (SINGULAIR) 10 MG tablet TAKE ONE TABLET BY MOUTH EVERY EVENING 90 tablet 3    Omega-3 Fatty Acids (OMEGA-3 FISH OIL) 500 MG Cap Take 2 capsules (1,000 mg) by mouth 2 (two) times daily      omeprazole (PriLOSEC) 40 MG capsule Take 1 capsule (40 mg) by mouth every 24 hours      polyethylene glycol (MIRALAX) 17 GM/SCOOP powder Take 17 g by mouth daily 225 g 0    Synthroid 75 MCG tablet TAKE 1 TABLET BY MOUTH DAILY 90 tablet 1    vitamins/minerals Tab Take 1  tablet by mouth daily       No current facility-administered medications for this visit.        Review of Systems:      Otherwise 10 point review of systems is negative.    PE:    Vitals:    03/03/23 1518   BP: 159/75   Pulse: 71   Temp: 97.2 F (36.2 C)   SpO2: 93%     Body mass index is 26.45 kg/m.    Physical Examination: General appearance - alert, well appearing, and in no distress  Mental status - alert, oriented to person, place, and time  Chest - clear to auscultation, no wheezes, rales or rhonchi, symmetric air entry  Heart - normal rate and regular rhythm, S1 and S2 normal  Abdomen - not examined  Extremities - no pedal edema noted         Labs:  Lipid Panel   Cholesterol   Date/Time Value Ref Range Status   05/19/2022 01:45 PM 149 0 - 199 mg/dL Final     Triglycerides   Date/Time Value Ref Range Status   05/19/2022 01:45 PM  159 (H) 34 - 149 mg/dL Final     HDL   Date/Time Value Ref Range Status   05/19/2022 01:45 PM 30 (L) 40 - 9,999 mg/dL Final     Comment:     An HDL cholesterol <40 mg/dL is low and constitutes a  coronary heart disease risk factor, and HDL-C>59 mg/dL is  a negative risk factor for CHD.  Ref: American Heart Association; Circulation 2004         CMP:   Sodium   Date/Time Value Ref Range Status   10/28/2022 08:39 AM 140 135 - 145 mEq/L Final     Potassium   Date/Time Value Ref Range Status   10/28/2022 08:39 AM 4.8 3.5 - 5.3 mEq/L Final     Chloride   Date/Time Value Ref Range Status   10/28/2022 08:39 AM 107 99 - 111 mEq/L Final     CO2   Date/Time Value Ref Range Status   10/28/2022 08:39 AM 24 17 - 29 mEq/L Final     Glucose   Date/Time Value Ref Range Status   10/28/2022 08:39 AM 97 70 - 100 mg/dL Final     Comment:     ADA guidelines for diabetes mellitus:  Fasting:  Equal to or greater than 126 mg/dL  Random:   Equal to or greater than 200 mg/dL       BUN   Date/Time Value Ref Range Status   10/28/2022 08:39 AM 19.0 7.0 - 21.0 mg/dL Final     Protein, Total   Date/Time Value Ref Range Status   05/19/2022 01:45 PM 7.1 6.0 - 8.3 g/dL Final     Alkaline Phosphatase   Date/Time Value Ref Range Status   05/19/2022 01:45 PM 66 37 - 117 U/L Final     AST (SGOT)   Date/Time Value Ref Range Status   05/19/2022 01:45 PM 22 5 - 41 U/L Final     ALT   Date/Time Value Ref Range Status   05/19/2022 01:45 PM 16 0 - 55 U/L Final     Anion Gap   Date/Time Value Ref Range Status   10/28/2022 08:39 AM 9.0 5.0 - 15.0 Final     Comment:     Calculated AGAP = Na - (CL + CO2)  Interpret with caution; calculated AGAP may not  reflect patient's true  clinical status.  This is a calculated value and platform-dependent.  A value >12.0 has been recommended for the management of  Hyperglycemic Crises: Diabetic Ketoacidosis and Hyperglycemic  Hyperosmolar State. Med Clin [place]. 2017;101(3):587-606.  doi:10.1016/j.mcna.2016.12.011          CBC:   WBC   Date/Time Value Ref Range Status   05/19/2022 01:45 PM 10.77 (H) 3.10 - 9.50 x10 3/uL Final     RBC   Date/Time Value Ref Range Status   05/19/2022 01:45 PM 3.81 (L) 3.90 - 5.10 x10 6/uL Final     Hgb   Date/Time Value Ref Range Status   05/19/2022 01:45 PM 11.8 11.4 - 14.8 g/dL Final     Hematocrit   Date/Time Value Ref Range Status   05/19/2022 01:45 PM 35.0 34.7 - 43.7 % Final     MCV   Date/Time Value Ref Range Status   05/19/2022 01:45 PM 91.9 78.0 - 96.0 fL Final     MCHC   Date/Time Value Ref Range Status   05/19/2022 01:45 PM 33.7 31.5 - 35.8 g/dL Final     RDW   Date/Time Value Ref Range Status   05/19/2022 01:45 PM 13 11 - 15 % Final     Platelets   Date/Time Value Ref Range Status   05/19/2022 01:45 PM 358 (H) 142 - 346 x10 3/uL Final           Impression / plan    Hypertension: Recommend home blood pressure recordings.  She will follow-up in 3 months and review home blood pressure recordings.  In the meantime continue Toprol-XL 100 mg daily, losartan 100 mg daily and amlodipine 10 mg daily.  If blood pressure remains uncontrolled we will add low-dose diuretic.  Again stressed importance of low-sodium diet.    Exertional dyspnea: Noncardiac.  Preserved LV systolic function and normal myocardial perfusion study.  Symptoms pulmonary in etiology.    Follow-up with cardiology in 3 months      Nelda Bucks, MD  03/03/2023

## 2023-03-04 ENCOUNTER — Ambulatory Visit
Admission: RE | Admit: 2023-03-04 | Discharge: 2023-03-04 | Disposition: A | Discharge status: Home / Self Care | Payer: Medicare Other | Source: Ambulatory Visit | Attending: Family Medicine | Admitting: Family Medicine

## 2023-03-04 DIAGNOSIS — M545 Low back pain, unspecified: Secondary | ICD-10-CM | POA: Insufficient documentation

## 2023-03-04 DIAGNOSIS — M810 Age-related osteoporosis without current pathological fracture: Secondary | ICD-10-CM | POA: Insufficient documentation

## 2023-03-10 ENCOUNTER — Telehealth (INDEPENDENT_AMBULATORY_CARE_PROVIDER_SITE_OTHER): Payer: Self-pay | Admitting: Internal Medicine

## 2023-03-10 ENCOUNTER — Other Ambulatory Visit (INDEPENDENT_AMBULATORY_CARE_PROVIDER_SITE_OTHER): Payer: Self-pay

## 2023-03-10 DIAGNOSIS — J42 Unspecified chronic bronchitis: Secondary | ICD-10-CM

## 2023-03-10 MED ORDER — BUDESONIDE-FORMOTEROL FUMARATE 160-4.5 MCG/ACT IN AERO
2.0000 | INHALATION_SPRAY | Freq: Two times a day (BID) | RESPIRATORY_TRACT | 1 refills | Status: DC
Start: 2023-03-10 — End: 2023-09-21

## 2023-03-10 NOTE — Telephone Encounter (Signed)
Name, strength, directions of requested refill(s):    budesonide-formoterol (SYMBICORT) 160-4.5 MCG/ACT inhaler  How much medication is remaining:     Pharmacy to send refill to or patient to pick up rx from office (mark requested pharmacy in BOLD):      [id number] [place], [place] - [address]  [address]  [facility] [place] [address]  Phone: [phone] Fax: [phone]        Please mark "X" next to the preferred call back number:    Mobile: [phone] (mobile) X   Home: [phone] (home)    Work: @WORKPHONE @        Medication refill request, see above. Thank you   Patient has been informed that medication refill requests should be called in up to one week prior to running out of medication.    Additional Notes:  Last Visit: MM/DD/YYYY  Next Visit: MM/DD/YYYY

## 2023-03-11 NOTE — Progress Notes (Signed)
Called pt and advised results per Dr. Nechama Guard. Pt voiced understanding. Pt scheduled for f/u visit on 03/31/23.

## 2023-03-11 NOTE — Progress Notes (Signed)
Called pt and advised results per Dr. Nechama Guard. Pt verbalized understanding. Scheduled pt for f/u appt on 03/31/23.

## 2023-03-17 ENCOUNTER — Encounter (INDEPENDENT_AMBULATORY_CARE_PROVIDER_SITE_OTHER): Payer: Self-pay | Admitting: Internal Medicine

## 2023-03-17 ENCOUNTER — Ambulatory Visit (INDEPENDENT_AMBULATORY_CARE_PROVIDER_SITE_OTHER): Payer: Medicare Other | Admitting: Internal Medicine

## 2023-03-17 ENCOUNTER — Ambulatory Visit (INDEPENDENT_AMBULATORY_CARE_PROVIDER_SITE_OTHER): Payer: Medicare Other

## 2023-03-17 VITALS — BP 128/70 | HR 77 | Temp 97.2°F | Resp 20 | Ht 61.0 in | Wt 140.0 lb

## 2023-03-17 DIAGNOSIS — J45909 Unspecified asthma, uncomplicated: Secondary | ICD-10-CM

## 2023-03-17 DIAGNOSIS — E785 Hyperlipidemia, unspecified: Secondary | ICD-10-CM

## 2023-03-17 DIAGNOSIS — K219 Gastro-esophageal reflux disease without esophagitis: Secondary | ICD-10-CM

## 2023-03-17 DIAGNOSIS — J309 Allergic rhinitis, unspecified: Secondary | ICD-10-CM

## 2023-03-17 DIAGNOSIS — J4530 Mild persistent asthma, uncomplicated: Secondary | ICD-10-CM

## 2023-03-17 DIAGNOSIS — I10 Essential (primary) hypertension: Secondary | ICD-10-CM

## 2023-03-17 DIAGNOSIS — N1831 Chronic kidney disease, stage 3a: Secondary | ICD-10-CM

## 2023-03-17 DIAGNOSIS — R0982 Postnasal drip: Secondary | ICD-10-CM

## 2023-03-17 MED ORDER — AZELASTINE HCL 0.1 % NA SOLN
2.0000 | Freq: Two times a day (BID) | NASAL | 5 refills | Status: AC
Start: 2023-03-17 — End: 2024-06-09

## 2023-03-17 NOTE — Progress Notes (Unsigned)
Patient Name: Leslie Ray, Leslie Ray  Date of Visit:  03/17/2023  Date of Birth: 06-14-1935  AGE: 88 y.o.  Medical Record #: [id number]  Requesting Physician: Jules Husbands, DO    HISTORY OF PRESENT ILLNESS     INTIIAL CONSULT (11/06/18)   The patient was recently admitted to [facility] on December 4th 2019 for chest pain and shortness of breath. Chest x-ray report revealed mild hyperinflation and increased interstitial markings but no consolidation or pleural effusions. Note, I cannot review these images directly. She was treated for a presumed COPD exacerbation given wheezing and with steroids and antibiotics. She was discharged on azithromycin, prednisone, and Singulair. She has improved steadily with this and has essentially returned to her baseline as of today's visit.     At baseline, she does report some dyspnea with exertion, though this is generally mild. She does not exercise per se, but does not have limitation from dyspnea. She has no prior history of asthma or COPD, but does report occasional wheezing over the years. Since starting Singulair December, this has helped this. She has had 2 prior pneumonias in the past. She has a history of secondhand smoke, but has never been a smoker. She does have a history of allergies and chronic postnasal drainage. It is not clear which seasons are particularly worse for her, but her allergies are generally mild as well.     INTERVAL HISTORY     04/23/2021  The patient states that she has been doing fairly well from a respiratory standpoint. Her main complaints at this time are her occasional wheezing at night, hoarseness, and productive cough. She notes that she had severe GERD last week and it exacerbated her cough. She has been coughing up yellow/white mucus for the last 2-3 days and she suspects it may be driven by postnasal drip. The patient states that she has been taking a Claritin and using her Flonase and Symbicort, with improvement.      06/04/2021   Pt comes in for a follow-up visit. She feels much better after she completed the Prednisone this weekend. She is no longer wheezing at night. She does not have any concerning respiratory symptoms. Her asthma remained well controlled on Symbicort. She uses Flonase nasal spray intermittently when her allergies tend to worsen which helps with her symptoms. She has no concerning respiratory symptoms. She denies fevers and chill.     08/24/2021   She has an intermittent cough with some white phlegm. She notes some sinus congestion and dizziness a few weeks ago. She states that she "just took my allergy pills and suffered". She notes that if she forgets to use the Symbicort she will have wheezing, but does not have it when she is compliant with her medication. She has not had any asthma exacerbations requiring steroids since the last visit. Overall she feels well since her last visit.     03/17/2023  Leslie Ray is a 88 y.o.  female, who is here for follow up. She is compliant with Symbicort HFA. She notices increasing shortness of breath with associated wheezing when she does not use it. Her main complaint  is regarding her sinus drainage and chest congestion. She has difficulty expectorating her secretions. She finds minimal benefits from Singulair, but not from Flonase nasal spray. She states Flonase causes her nasal cavity to have a burning sensation. She has not had any recent flare ups which led to requiring oral steroids or antibiotics. She denies fevers and chills.  PHYSICAL EXAM:  Vitals:    03/17/23 1006   BP: 128/70   Pulse: 77   Resp: 20   Temp: 97.2 F (36.2 C)   SpO2: 95%      Physical Exam  Constitutional:       Appearance: She is normal weight.   HENT:      Nose: Congestion present.   Cardiovascular:      Rate and Rhythm: Normal rate and regular rhythm.      Pulses: Normal pulses.      Heart sounds: Normal heart sounds.   Pulmonary:      Effort: Pulmonary effort is normal.       Breath sounds: Normal breath sounds.   Neurological:      General: No focal deficit present.      Mental Status: She is alert and oriented to person, place, and time. Mental status is at baseline.       Current Outpatient Medications   Medication Sig Dispense Refill    acetaminophen (TYLENOL) 160 MG chewable tablet Chew 1 tablet (160 mg) by mouth every 6 (six) hours as needed for Pain      albuterol sulfate HFA (PROVENTIL) 108 (90 Base) MCG/ACT inhaler INHALE TWO PUFFS BY MOUTH EVERY 4 HOURS AS NEEDED FOR SHORTNESS OF BREATH 8.5 g 1    alendronate (FOSAMAX) 70 MG tablet TAKE 1 TABLET BY MOUTH ONCE WEEKLY ON AN EMPTY STOMACH BEFORE BREAKFAST. REMAIN UPRIGHT FOR 30 MINUTES AND TAKE WITH 8 OUNCES OF WATER 12 tablet 3    allopurinol (ZYLOPRIM) 100 MG tablet TAKE ONE TABLET BY MOUTH DAILY 90 tablet 3    amLODIPine (NORVASC) 10 MG tablet TAKE ONE TABLET BY MOUTH DAILY 100 tablet 3    atorvastatin (LIPITOR) 40 MG tablet TAKE ONE TABLET BY MOUTH DAILY 90 tablet 3    budesonide-formoterol (SYMBICORT) 160-4.5 MCG/ACT inhaler Inhale 2 puffs into the lungs 2 (two) times daily 1 each 1    calcium-vitamin D (OSCAL-500 + D) 500-200 MG-UNIT per tablet Take 1 tablet by mouth 2 (two) times daily 600mg  (1500mg ) - 200U tab      clobetasol (TEMOVATE) 0.05 % cream       estradiol (ESTRACE) 0.1 MG/GM vaginal cream       fluticasone (FLONASE) 50 MCG/ACT nasal spray 2 sprays by Nasal route daily 16 g 5    Loratadine (CLARITIN PO) Take 5 mg by mouth daily         losartan (COZAAR) 100 MG tablet TAKE ONE TABLET BY MOUTH DAILY 100 tablet 3    metoprolol succinate (TOPROL-XL) 100 MG 24 hr tablet TAKE ONE TABLET BY MOUTH DAILY 90 tablet 3    montelukast (SINGULAIR) 10 MG tablet TAKE ONE TABLET BY MOUTH EVERY EVENING 90 tablet 3    Omega-3 Fatty Acids (OMEGA-3 FISH OIL) 500 MG Cap Take 2 capsules (1,000 mg) by mouth 2 (two) times daily      omeprazole (PriLOSEC) 40 MG capsule Take 1 capsule (40 mg) by mouth every 24 hours      polyethylene glycol  (MIRALAX) 17 GM/SCOOP powder Take 17 g by mouth daily 225 g 0    Synthroid 75 MCG tablet TAKE 1 TABLET BY MOUTH DAILY 90 tablet 1    vitamins/minerals Tab Take 1 tablet by mouth daily      azelastine (ASTELIN) 0.1 % nasal spray 2 sprays by Nasal route 2 (two) times daily Use in each nostril as directed 30 mL 5    hydrocortisone (ANUSOL-HC)  2.5 % rectal cream Place rectally 3 (three) times daily (Patient not taking: Reported on 03/17/2023) 30 g 0    hydrocortisone (ANUSOL-HC) 25 MG suppository Place 1 suppository (25 mg) rectally 2 (two) times daily (Patient not taking: Reported on 03/17/2023) 30 suppository 0     No current facility-administered medications for this visit.     Spiro/NIOX/6MWT/PFT Data:  Spirometry on 03/17/23: Declined    Spirometry on 08/24/2021: FVC: 1.93 (92%), FEV1: 1.45 (91%), FEV1/FVC: 75%--Normal     Spirometry on 06/04/2021: FVC 1.98 (94%), FEV1 1.55 (97%), FEV1/FVC 79%-- Normal     Spirometry on 04/23/2021: FEV1 = 1.45 (91%), FVC = 1.84 (88%), FEV1/FVC 78% - normal     Laboratory and Chest Images:  Lab Results   Component Value Date    WBC 10.77 (H) 05/19/2022    HGB 11.8 05/19/2022    HCT 35.0 05/19/2022    PLT 358 (H) 05/19/2022    ABSEOSAUTOMA 0.72 (H) 05/19/2022      Lab Results   Component Value Date    GLU 97 10/28/2022    ALKPHOS 66 05/19/2022    AST 22 05/19/2022    ALT 16 05/19/2022    CREAT 0.9 10/28/2022    CO2 24 10/28/2022     In-Clinic CXR on 03/18/2023: personally reviewed and demonstrated, no acute cardiopulmonary process     CXR on 03/22/2019, personally reviewed and demonstrated, Mild cardiomegaly. Emphysematous changes. Left basilar atelectasis or scarring.     ASSESSMENT DETAILS:  Shaila Hardin  is a 88 y.o.  female.    1. Mild persistent asthma     2. Chronic allergic rhinitis    3. Post-nasal drip    4. Gastroesophageal reflux disease without esophagitis    5. Stage 3a chronic kidney disease    6. Essential hypertension    7. Hyperlipidemia, unspecified  hyperlipidemia type      PLAN DETAILS:  Start Azelastine Nasal spray - 2 sprays each nostril BID   Continue Symbicort 160 mcg 2 puffs BID--Pt is aware to gargle with water, spit, then brush teeth after each use to prevent thrush  Continue ProAir Digihaler 2 puffs every 4-6 hours prn for shortness of breath, chest tightness, or wheezing   Continue Singulair 10 mg QHS   Continue Claritin QD    Follow up in  6 months. Pt is advised to give [place] a call sooner if any worsening respiratory issues arises.     45 mins of total time spent in the evaluation and management of this patient excluding any teaching or procedures.    I personally scribed for Loman Chroman, MD on Mar 17, 2023 . The documentation recorded by the scribe, Ether Griffins , accurately reflects the service I personally performed, and the decisions made by me, Loman Chroman, MD, Mar 17, 2023

## 2023-03-18 DIAGNOSIS — J4530 Mild persistent asthma, uncomplicated: Secondary | ICD-10-CM | POA: Insufficient documentation

## 2023-03-18 DIAGNOSIS — R0982 Postnasal drip: Secondary | ICD-10-CM | POA: Insufficient documentation

## 2023-03-31 ENCOUNTER — Encounter (INDEPENDENT_AMBULATORY_CARE_PROVIDER_SITE_OTHER): Payer: Self-pay | Admitting: Family Medicine

## 2023-03-31 ENCOUNTER — Ambulatory Visit (INDEPENDENT_AMBULATORY_CARE_PROVIDER_SITE_OTHER): Payer: Medicare Other | Admitting: Family Medicine

## 2023-03-31 ENCOUNTER — Other Ambulatory Visit (INDEPENDENT_AMBULATORY_CARE_PROVIDER_SITE_OTHER): Payer: Self-pay | Admitting: Family Medicine

## 2023-03-31 VITALS — BP 128/66 | HR 63 | Temp 98.0°F | Ht 61.0 in | Wt 142.0 lb

## 2023-03-31 DIAGNOSIS — M47816 Spondylosis without myelopathy or radiculopathy, lumbar region: Secondary | ICD-10-CM

## 2023-03-31 DIAGNOSIS — M545 Low back pain, unspecified: Secondary | ICD-10-CM

## 2023-03-31 DIAGNOSIS — M25551 Pain in right hip: Secondary | ICD-10-CM

## 2023-03-31 DIAGNOSIS — I10 Essential (primary) hypertension: Secondary | ICD-10-CM

## 2023-03-31 DIAGNOSIS — G8929 Other chronic pain: Secondary | ICD-10-CM

## 2023-03-31 NOTE — Progress Notes (Signed)
Subjective     Leslie Ray is a 88 y.o. female who presents today for   Chief Complaint   Patient presents with    Imaging Follow Up    Back Pain    Hip Pain     Hip Pain   Incident onset: chronic pain for years, unknown of any injury. The injury mechanism is unknown. The pain is present in the right hip. The quality of the pain is described as aching. The pain is at a severity of 3/10. The pain has been Intermittent since onset. Pertinent negatives include no inability to bear weight, numbness or tingling. Exacerbated by: sweeping, vacuuming, standing for too long. She has tried acetaminophen and rest for the symptoms. The treatment provided moderate relief.     Pt was seen in April for acute low back pain.  Pt reports she continues to have intermittent low back pain.  When sweeps, vacuums, or stands for long time her low back hurts her.   Pain does not radiate. Pt states sometimes the pain is so mild she doesn't take anything. A heating pad helps as well. If more severe, tylenol is effective.      COMPARISON: None available.     FINDINGS:   Lumbar lordosis is exaggerated. There is degenerative grade 1  anterolisthesis at L5-S1 by 4 mm. Remainder the alignment is maintained.     Osseous structures appear mildly demineralized. No evidence of acute  fracture.     Mild multilevel endplate degenerative changes are present. Moderate facet  degeneration the lower lumbar spine.     Prominent vascular calcifications are noted in the abdominal aorta and  iliac arteries.     IMPRESSION:      1.No evidence of acute process.  2.Degenerative changes, as above. If radicular symptoms are present,  consider further characterization with cross-sectional imaging.     Garnette Gunner, MD  03/09/2023 9:59 AM    Pt c/o right hip pain for several years. States is achy at times.     No problems updated.    Patient Active Problem List   Diagnosis    Gout    GERD (gastroesophageal reflux disease)    Hyperlipidemia    Essential  hypertension    Chronic allergic rhinitis    Mild intermittent asthma    Irregular bowel habits    Hypothyroid    Stage 3a chronic kidney disease    Counseling on health promotion and disease prevention    Osteoporosis    Mild persistent asthma without complication    Post-nasal drip     Current Outpatient Medications   Medication Sig Dispense Refill    acetaminophen (TYLENOL) 160 MG chewable tablet Chew 1 tablet (160 mg) by mouth every 6 (six) hours as needed for Pain      albuterol sulfate HFA (PROVENTIL) 108 (90 Base) MCG/ACT inhaler INHALE TWO PUFFS BY MOUTH EVERY 4 HOURS AS NEEDED FOR SHORTNESS OF BREATH 8.5 g 1    alendronate (FOSAMAX) 70 MG tablet TAKE 1 TABLET BY MOUTH ONCE WEEKLY ON AN EMPTY STOMACH BEFORE BREAKFAST. REMAIN UPRIGHT FOR 30 MINUTES AND TAKE WITH 8 OUNCES OF WATER 12 tablet 3    allopurinol (ZYLOPRIM) 100 MG tablet TAKE ONE TABLET BY MOUTH DAILY 90 tablet 3    amLODIPine (NORVASC) 10 MG tablet TAKE ONE TABLET BY MOUTH DAILY 100 tablet 3    atorvastatin (LIPITOR) 40 MG tablet TAKE ONE TABLET BY MOUTH DAILY 90 tablet 3    azelastine (ASTELIN)  0.1 % nasal spray 2 sprays by Nasal route 2 (two) times daily Use in each nostril as directed 30 mL 5    budesonide-formoterol (SYMBICORT) 160-4.5 MCG/ACT inhaler Inhale 2 puffs into the lungs 2 (two) times daily 1 each 1    calcium-vitamin D (OSCAL-500 + D) 500-200 MG-UNIT per tablet Take 1 tablet by mouth 2 (two) times daily 600mg  (1500mg ) - 200U tab      clobetasol (TEMOVATE) 0.05 % cream       estradiol (ESTRACE) 0.1 MG/GM vaginal cream       Loratadine (CLARITIN PO) Take 5 mg by mouth daily         losartan (COZAAR) 100 MG tablet TAKE ONE TABLET BY MOUTH DAILY 100 tablet 3    metoprolol succinate (TOPROL-XL) 100 MG 24 hr tablet TAKE ONE TABLET BY MOUTH DAILY 90 tablet 3    montelukast (SINGULAIR) 10 MG tablet TAKE ONE TABLET BY MOUTH EVERY EVENING 90 tablet 3    Omega-3 Fatty Acids (OMEGA-3 FISH OIL) 500 MG Cap Take 2 capsules (1,000 mg) by mouth 2  (two) times daily      omeprazole (PriLOSEC) 40 MG capsule Take 1 capsule (40 mg) by mouth every 24 hours      polyethylene glycol (MIRALAX) 17 GM/SCOOP powder Take 17 g by mouth daily 225 g 0    Synthroid 75 MCG tablet TAKE 1 TABLET BY MOUTH DAILY 90 tablet 1    vitamins/minerals Tab Take 1 tablet by mouth daily      fluticasone (FLONASE) 50 MCG/ACT nasal spray 2 sprays by Nasal route daily (Patient not taking: Reported on 03/31/2023) 16 g 5    hydrocortisone (ANUSOL-HC) 2.5 % rectal cream Place rectally 3 (three) times daily (Patient not taking: Reported on 03/17/2023) 30 g 0    hydrocortisone (ANUSOL-HC) 25 MG suppository Place 1 suppository (25 mg) rectally 2 (two) times daily (Patient not taking: Reported on 03/17/2023) 30 suppository 0     No current facility-administered medications for this visit.     Allergies   Allergen Reactions    Penicillins        Patient Care Team:  Jules Husbands, DO as PCP - General (Family Medicine)  Loman Chroman, MD as Consulting Physician (Critical Care Medicine)  [employer] as Consulting Physician (Optometry)  Nelda Bucks, MD as Consulting Physician (Cardiology)    Review of Systems   Neurological:  Negative for tingling and numbness.       Objective     BP 128/66 (BP Site: Left arm, Patient Position: Sitting, Cuff Size: Medium)   Pulse 63   Temp 98 F (36.7 C)   Ht 1.549 m (5\' 1" )   Wt 64.4 kg (142 lb)   BMI 26.83 kg/m   Wt Readings from Last 3 Encounters:   03/31/23 64.4 kg (142 lb)   03/17/23 63.5 kg (140 lb)   03/03/23 63.5 kg (140 lb)     Physical Exam    Hip exam  Gait analysis: no limp or antalgic gait  Stance:    Modified Trendelenburg test: negative bilaterally  Abduction in supine position ~45 deg bilaterally  Adduction in supine ~20-30 deg bilaterally  Extension on lateral recumbent: ~10 deg bilaterally   FABER/Patrick Test - negative b/l  FADIR test (flexion ,adduction, internal rotation; impingement test) - negative b/l  Log roll test - negative  bilaterally  Straight leg raise against resistance  - negative  No pain overlying IT band and hip bursa  bilaterally    Diagnostic Tests:    Assessment & Plan     1. Chronic hip pain, right  - XR Hip Right 2-3 vw without pelvis  - Ambulatory referral to Physical Therapy; Future    2. Chronic midline low back pain without sciatica  - Ambulatory referral to Physical Therapy; Future    3. Osteoarthritis of lumbar spine, unspecified spinal osteoarthritis complication status  - Ambulatory referral to Physical Therapy; Future    Activity modifications  Heating pad/tylenol PRN  Consult PT    Recommend covid vaccination    The following activities were performed on the date of service:  obtaining and/or reviewing the separately obtained history  performing a medically appropriate examination and/or evaluation   counseling and educating the patient/family/caregiver  referring and communicating with other health care professionals (when not separately reported)   documenting clinical information in the electronic or other health records    Total time spent performing activities on date of service:  23  minutes          Has appointment 06/15/2023 , keep this appointment return sooner if needed

## 2023-03-31 NOTE — Progress Notes (Signed)
Have you seen any specialists/other providers since your last visit with us?    No    The patient is due for:   Health Maintenance Due   Topic Date Due    Advance Directive on File  Never done    Shingrix Vaccine 50+ (2) 05/13/2002    COVID-19 Vaccine (3 - 2023-24 season) 06/25/2022

## 2023-04-02 ENCOUNTER — Other Ambulatory Visit (INDEPENDENT_AMBULATORY_CARE_PROVIDER_SITE_OTHER): Payer: Self-pay | Admitting: Family Medicine

## 2023-04-17 ENCOUNTER — Other Ambulatory Visit (INDEPENDENT_AMBULATORY_CARE_PROVIDER_SITE_OTHER): Payer: Self-pay | Admitting: Internal Medicine

## 2023-04-19 ENCOUNTER — Other Ambulatory Visit (INDEPENDENT_AMBULATORY_CARE_PROVIDER_SITE_OTHER): Payer: Self-pay | Admitting: Family Medicine

## 2023-05-15 ENCOUNTER — Other Ambulatory Visit (INDEPENDENT_AMBULATORY_CARE_PROVIDER_SITE_OTHER): Payer: Self-pay | Admitting: Internal Medicine

## 2023-05-26 ENCOUNTER — Ambulatory Visit
Admission: RE | Admit: 2023-05-26 | Discharge: 2023-05-26 | Disposition: A | Discharge status: Home / Self Care | Payer: Medicare Other | Source: Ambulatory Visit | Attending: Family Medicine | Admitting: Family Medicine

## 2023-05-26 ENCOUNTER — Encounter (INDEPENDENT_AMBULATORY_CARE_PROVIDER_SITE_OTHER): Payer: Self-pay | Admitting: Cardiology

## 2023-05-26 ENCOUNTER — Ambulatory Visit (INDEPENDENT_AMBULATORY_CARE_PROVIDER_SITE_OTHER): Payer: Medicare Other | Admitting: Cardiology

## 2023-05-26 VITALS — BP 139/72 | HR 69 | Temp 97.6°F | Ht 61.0 in | Wt 138.0 lb

## 2023-05-26 DIAGNOSIS — G8929 Other chronic pain: Secondary | ICD-10-CM | POA: Insufficient documentation

## 2023-05-26 DIAGNOSIS — I10 Essential (primary) hypertension: Secondary | ICD-10-CM

## 2023-05-26 DIAGNOSIS — M25551 Pain in right hip: Secondary | ICD-10-CM | POA: Insufficient documentation

## 2023-05-26 LAB — ECG 12-LEAD
Atrial Rate: 69 {beats}/min
IHS MUSE NARRATIVE AND IMPRESSION: NORMAL
P Axis: 39 degrees
P-R Interval: 178 ms
Q-T Interval: 380 ms
QRS Duration: 74 ms
QTC Calculation (Bezet): 407 ms
R Axis: 2 degrees
T Axis: -13 degrees
Ventricular Rate: 69 {beats}/min

## 2023-05-26 NOTE — Progress Notes (Signed)
Leslie Ray 88 y.o. with a history of hypertension presents for cardiac follow-up of hypertension    Hypertension: Home blood pressures reviewed; blood pressure is well-controlled at home.  Current regimen includes Toprol-XL 100 mg daily, losartan 100 mg daily and amlodipine 10 mg daily.        Chronic exertional dyspnea.  Denies orthopnea or lower extremity edema.  Denies chest pain.  Intermittent episodes of palpitations.  Denies syncope.  Dyspnea is been ongoing over 5 years.  She has seen a pulmonologist in the past    Cardiac workup is included    Stress test (02/04/2023): Normal myocardial perfusion study.  No evidence of ischemia or infarct.    Echocardiogram (01/11/2023): Normal LV/RV size and systolic function.  Ejection fraction 65%.  Mild to moderate mitral regurgitation.  Mild tricuspid regurgitation.        Current Outpatient Medications   Medication Sig Dispense Refill    acetaminophen (TYLENOL) 160 MG chewable tablet Chew 1 tablet (160 mg) by mouth every 6 (six) hours as needed for Pain      albuterol sulfate HFA (PROVENTIL) 108 (90 Base) MCG/ACT inhaler INHALE TWO PUFFS BY MOUTH EVERY 4 HOURS AS NEEDED FOR SHORTNESS OF BREATH 8.5 g 1    alendronate (FOSAMAX) 70 MG tablet TAKE 1 TABLET BY MOUTH ONCE WEEKLY ON AN EMPTY STOMACH BEFORE BREAKFAST. REMAIN UPRIGHT FOR 30 MINUTES AND TAKE WITH 8 OUNCES OF WATER 12 tablet 3    allopurinol (ZYLOPRIM) 100 MG tablet TAKE ONE TABLET BY MOUTH DAILY 90 tablet 3    amLODIPine (NORVASC) 10 MG tablet TAKE ONE TABLET BY MOUTH DAILY 100 tablet 3    atorvastatin (LIPITOR) 40 MG tablet TAKE ONE TABLET BY MOUTH DAILY 90 tablet 3    azelastine (ASTELIN) 0.1 % nasal spray 2 sprays by Nasal route 2 (two) times daily Use in each nostril as directed 30 mL 5    budesonide-formoterol (SYMBICORT) 160-4.5 MCG/ACT inhaler Inhale 2 puffs into the lungs 2 (two) times daily 1 each 1    calcium-vitamin D (OSCAL-500 + D) 500-200 MG-UNIT per tablet Take 1 tablet by mouth 2 (two)  times daily 600mg  (1500mg ) - 200U tab      clobetasol (TEMOVATE) 0.05 % cream       estradiol (ESTRACE) 0.1 MG/GM vaginal cream       fluticasone (FLONASE) 50 MCG/ACT nasal spray 2 sprays by Nasal route daily 16 g 5    hydrocortisone (ANUSOL-HC) 2.5 % rectal cream Place rectally 3 (three) times daily 30 g 0    hydrocortisone (ANUSOL-HC) 25 MG suppository Place 1 suppository (25 mg) rectally 2 (two) times daily 30 suppository 0    Loratadine (CLARITIN PO) Take 5 mg by mouth daily         losartan (COZAAR) 100 MG tablet TAKE ONE TABLET BY MOUTH DAILY 100 tablet 3    metoprolol succinate (TOPROL-XL) 100 MG 24 hr tablet TAKE 1 TABLET BY MOUTH DAILY 100 tablet 3    montelukast (SINGULAIR) 10 MG tablet TAKE ONE TABLET BY MOUTH EVERY EVENING 90 tablet 3    Omega-3 Fatty Acids (OMEGA-3 FISH OIL) 500 MG Cap Take 2 capsules (1,000 mg) by mouth 2 (two) times daily      omeprazole (PriLOSEC) 40 MG capsule TAKE ONE CAPSULE BY MOUTH DAILY BEFORE A MEAL 90 capsule 3    polyethylene glycol (MIRALAX) 17 GM/SCOOP powder Take 17 g by mouth daily 225 g 0    Synthroid 75 MCG tablet TAKE  1 TABLET BY MOUTH DAILY 90 tablet 0    vitamins/minerals Tab Take 1 tablet by mouth daily       No current facility-administered medications for this visit.        Review of Systems:      Otherwise 10 point review of systems is negative.    PE:    Vitals:    05/26/23 0852   BP: 139/72   Pulse: 69   Temp:    SpO2:      Body mass index is 26.07 kg/m.    Physical Examination: General appearance - alert, well appearing, and in no distress  Mental status - alert, oriented to person, place, and time  Chest - clear to auscultation, no wheezes, rales or rhonchi, symmetric air entry  Heart - normal rate and regular rhythm, S1 and S2 normal  Abdomen - not examined  Extremities - no pedal edema noted     EKG: Normal sinus rhythm, nonspecific ST abnormality    Labs:  Lipid Panel   Cholesterol   Date/Time Value Ref Range Status   05/19/2022 01:45 PM 149 0 - 199  mg/dL Final     Triglycerides   Date/Time Value Ref Range Status   05/19/2022 01:45 PM 159 (H) 34 - 149 mg/dL Final     HDL   Date/Time Value Ref Range Status   05/19/2022 01:45 PM 30 (L) 40 - 9,999 mg/dL Final     Comment:     An HDL cholesterol <40 mg/dL is low and constitutes a  coronary heart disease risk factor, and HDL-C>59 mg/dL is  a negative risk factor for CHD.  Ref: American Heart Association; Circulation 2004         CMP:   Sodium   Date/Time Value Ref Range Status   10/28/2022 08:39 AM 140 135 - 145 mEq/L Final     Potassium   Date/Time Value Ref Range Status   10/28/2022 08:39 AM 4.8 3.5 - 5.3 mEq/L Final     Chloride   Date/Time Value Ref Range Status   10/28/2022 08:39 AM 107 99 - 111 mEq/L Final     CO2   Date/Time Value Ref Range Status   10/28/2022 08:39 AM 24 17 - 29 mEq/L Final     Glucose   Date/Time Value Ref Range Status   10/28/2022 08:39 AM 97 70 - 100 mg/dL Final     Comment:     ADA guidelines for diabetes mellitus:  Fasting:  Equal to or greater than 126 mg/dL  Random:   Equal to or greater than 200 mg/dL       BUN   Date/Time Value Ref Range Status   10/28/2022 08:39 AM 19.0 7.0 - 21.0 mg/dL Final     Protein, Total   Date/Time Value Ref Range Status   05/19/2022 01:45 PM 7.1 6.0 - 8.3 g/dL Final     Alkaline Phosphatase   Date/Time Value Ref Range Status   05/19/2022 01:45 PM 66 37 - 117 U/L Final     AST (SGOT)   Date/Time Value Ref Range Status   05/19/2022 01:45 PM 22 5 - 41 U/L Final     ALT   Date/Time Value Ref Range Status   05/19/2022 01:45 PM 16 0 - 55 U/L Final     Anion Gap   Date/Time Value Ref Range Status   10/28/2022 08:39 AM 9.0 5.0 - 15.0 Final     Comment:     Calculated AGAP =  Na - (CL + CO2)  Interpret with caution; calculated AGAP may not  reflect patient's true clinical status.  This is a calculated value and platform-dependent.  A value >12.0 has been recommended for the management of  Hyperglycemic Crises: Diabetic Ketoacidosis and Hyperglycemic  Hyperosmolar  State. Med Clin [place]. 2017;101(3):587-606.  doi:10.1016/j.mcna.2016.12.011         CBC:   WBC   Date/Time Value Ref Range Status   05/19/2022 01:45 PM 10.77 (H) 3.10 - 9.50 x10 3/uL Final     RBC   Date/Time Value Ref Range Status   05/19/2022 01:45 PM 3.81 (L) 3.90 - 5.10 x10 6/uL Final     Hgb   Date/Time Value Ref Range Status   05/19/2022 01:45 PM 11.8 11.4 - 14.8 g/dL Final     Hematocrit   Date/Time Value Ref Range Status   05/19/2022 01:45 PM 35.0 34.7 - 43.7 % Final     MCV   Date/Time Value Ref Range Status   05/19/2022 01:45 PM 91.9 78.0 - 96.0 fL Final     MCHC   Date/Time Value Ref Range Status   05/19/2022 01:45 PM 33.7 31.5 - 35.8 g/dL Final     RDW   Date/Time Value Ref Range Status   05/19/2022 01:45 PM 13 11 - 15 % Final     Platelets   Date/Time Value Ref Range Status   05/19/2022 01:45 PM 358 (H) 142 - 346 x10 3/uL Final           Impression / plan    Hypertension: Home blood pressure is well-controlled.  Continue current regimen.    Exertional dyspnea: Noncardiac.  Preserved LV systolic function and normal myocardial perfusion study.  Symptoms pulmonary in etiology.    Follow-up with cardiology in 1 year      Nelda Bucks, MD  05/26/2023

## 2023-05-31 ENCOUNTER — Encounter (INDEPENDENT_AMBULATORY_CARE_PROVIDER_SITE_OTHER): Payer: Self-pay | Admitting: Family

## 2023-05-31 ENCOUNTER — Ambulatory Visit (INDEPENDENT_AMBULATORY_CARE_PROVIDER_SITE_OTHER): Payer: Medicare Other | Admitting: Family

## 2023-05-31 VITALS — BP 185/91 | HR 73 | Temp 98.1°F | Resp 16 | Ht 61.0 in | Wt 138.2 lb

## 2023-05-31 DIAGNOSIS — R519 Headache, unspecified: Secondary | ICD-10-CM

## 2023-05-31 DIAGNOSIS — R42 Dizziness and giddiness: Secondary | ICD-10-CM

## 2023-05-31 DIAGNOSIS — R4781 Slurred speech: Secondary | ICD-10-CM

## 2023-05-31 LAB — POCT GLUCOSE: Whole Blood Glucose POCT: 97 mg/dL (ref 70–100)

## 2023-05-31 NOTE — Progress Notes (Signed)
[facility]  URGENT  CARE  PROGRESS NOTE     Patient: Leslie Ray   Date: 05/31/2023   MRN: [id number]       Ivana Yusupov is a 88 y.o. female      HISTORY     History obtained from: Patient    Chief Complaint   Patient presents with    Headache     Pt C/O head pressure. Lightheaded , loss of balance. Onset x 5 days         Patient presents with her son.  Patient presents with dizziness, headache and slurred speech for 4 days.  Patient states that while she is sitting she feels like she is swinging.  Patient denied chest pain, shortness of breath, fever.        Headache          Review of Systems   Neurological:  Positive for headaches.       History:    Pertinent Past Medical, Surgical, Family and Social History were reviewed.      Current Medications[1]    Allergies[2]    Medications and Allergies reviewed.    PHYSICAL EXAM     Vitals:    05/31/23 1033 05/31/23 1149   BP: 149/78 (!) 185/91   BP Site: Right arm    Patient Position: Sitting    Cuff Size: Medium    Pulse: 73    Resp: 16    Temp: 98.1 F (36.7 C)    TempSrc: Tympanic    SpO2: 96%    Weight: 62.7 kg (138 lb 3.2 oz)    Height: 1.549 m (5\' 1" )        Physical Exam  Constitutional:       General: She is not in acute distress.     Appearance: She is well-developed.   HENT:      Head: Normocephalic and atraumatic.      Right Ear: Tympanic membrane and external ear normal.      Left Ear: Tympanic membrane and external ear normal.      Mouth/Throat:      Pharynx: No oropharyngeal exudate.     Eyes: Conjunctivae are normal. Right conjunctiva is not injected. Left conjunctiva is not injected. No scleral icterus. Cardiovascular:      Rate and Rhythm: Normal rate and regular rhythm.      Heart sounds: Normal heart sounds. No murmur heard.  Pulmonary:      Effort: Pulmonary effort is normal. No respiratory distress.      Breath sounds: Normal breath sounds. No wheezing or rales.   Musculoskeletal:      Cervical back: Normal range of motion and neck supple.    Lymphadenopathy:      Cervical: No cervical adenopathy.   Neurological:      Mental Status: She is alert and oriented to person, place, and time.   Skin:     General: Skin is warm and dry.      Findings: No rash.   Vitals and nursing note reviewed.         UCC COURSE     LABS  The following POCT tests were ordered, reviewed and discussed with the patient/family.     Results       Procedure Component Value Units Date/Time    POCT Glucose [[id number]]  (Normal) Collected: 05/31/23 1133     Updated: 05/31/23 1133     Whole Blood Glucose POCT 97 mg/dL  There were no x-rays reviewed with this patient during the visit.    Current Inpatient Medications with Last Dose Taken[3]    PROCEDURES     EKG Read    Date/Time: 05/31/2023 12:59 PM    Performed by: Josiah Lobo, FNP  Authorized by: Josiah Lobo, FNP    Previous ECG:     Previous ECG:  Compared to current    Similarity:  No change  Interpretation:     Interpretation: abnormal    Quality:     Tracing quality:  Limited by artifact  Rate:     ECG rate assessment: normal    Rhythm:     Rhythm: sinus rhythm    Ectopy:     Ectopy: none    ST segments:     ST segments:  Abnormal    Depression:  II and aVR      MEDICAL DECISION MAKING     History, physical, labs/studies most consistent with dizziness, slurred speech, headache as the diagnosis.        Chart Review:  Prior PCP, Specialist and/or ED notes reviewed today: Yes  Prior labs/images/studies reviewed today: No    Differential Diagnosis: CVA, TIA, MI, electrolyte imbalance, anemia, dehydration      ASSESSMENT     Encounter Diagnoses   Name Primary?    Dizziness and giddiness Yes    Slurred speech     Intractable headache, unspecified chronicity pattern, unspecified headache type                 PLAN      PLAN: Call 911 and sent patient to the emergency room.  Case discussed with Dr. Gilmore Laroche through secure chart and agreed to send the patient to the ED.          Orders Placed This Encounter   Procedures     Task for ECG    EKG Read    POCT Glucose     Requested Prescriptions      No prescriptions requested or ordered in this encounter       Discussed results and diagnosis with patient/family.  Reviewed warning signs for worsening condition, as well as, indications for follow-up with primary care physician and return to urgent care clinic.   Patient/family expressed understanding of instructions.     An After Visit Summary was provided to the patient.           [1]   Current Outpatient Medications:     amLODIPine (NORVASC) 10 MG tablet, TAKE ONE TABLET BY MOUTH DAILY, Disp: 100 tablet, Rfl: 3    atorvastatin (LIPITOR) 40 MG tablet, TAKE ONE TABLET BY MOUTH DAILY, Disp: 90 tablet, Rfl: 3    calcium-vitamin D (OSCAL-500 + D) 500-200 MG-UNIT per tablet, Take 1 tablet by mouth 2 (two) times daily 600mg  (1500mg ) - 200U tab, Disp: , Rfl:     clobetasol (TEMOVATE) 0.05 % cream, , Disp: , Rfl:     estradiol (ESTRACE) 0.1 MG/GM vaginal cream, , Disp: , Rfl:     fluticasone (FLONASE) 50 MCG/ACT nasal spray, 2 sprays by Nasal route daily, Disp: 16 g, Rfl: 5    hydrocortisone (ANUSOL-HC) 2.5 % rectal cream, Place rectally 3 (three) times daily, Disp: 30 g, Rfl: 0    hydrocortisone (ANUSOL-HC) 25 MG suppository, Place 1 suppository (25 mg) rectally 2 (two) times daily, Disp: 30 suppository, Rfl: 0    Loratadine (CLARITIN PO), Take 5 mg by mouth daily  , Disp: ,  Rfl:     losartan (COZAAR) 100 MG tablet, TAKE ONE TABLET BY MOUTH DAILY, Disp: 100 tablet, Rfl: 3    metoprolol succinate (TOPROL-XL) 100 MG 24 hr tablet, TAKE 1 TABLET BY MOUTH DAILY, Disp: 100 tablet, Rfl: 3    montelukast (SINGULAIR) 10 MG tablet, TAKE ONE TABLET BY MOUTH EVERY EVENING, Disp: 90 tablet, Rfl: 3    Omega-3 Fatty Acids (OMEGA-3 FISH OIL) 500 MG Cap, Take 2 capsules (1,000 mg) by mouth 2 (two) times daily, Disp: , Rfl:     omeprazole (PriLOSEC) 40 MG capsule, TAKE ONE CAPSULE BY MOUTH DAILY BEFORE A MEAL, Disp: 90 capsule, Rfl: 3    polyethylene glycol  (MIRALAX) 17 GM/SCOOP powder, Take 17 g by mouth daily, Disp: 225 g, Rfl: 0    Synthroid 75 MCG tablet, TAKE 1 TABLET BY MOUTH DAILY, Disp: 90 tablet, Rfl: 0    vitamins/minerals Tab, Take 1 tablet by mouth daily, Disp: , Rfl:     acetaminophen (TYLENOL) 160 MG chewable tablet, Chew 1 tablet (160 mg) by mouth every 6 (six) hours as needed for Pain, Disp: , Rfl:     albuterol sulfate HFA (PROVENTIL) 108 (90 Base) MCG/ACT inhaler, INHALE TWO PUFFS BY MOUTH EVERY 4 HOURS AS NEEDED FOR SHORTNESS OF BREATH, Disp: 8.5 g, Rfl: 1    alendronate (FOSAMAX) 70 MG tablet, TAKE 1 TABLET BY MOUTH ONCE WEEKLY ON AN EMPTY STOMACH BEFORE BREAKFAST. REMAIN UPRIGHT FOR 30 MINUTES AND TAKE WITH 8 OUNCES OF WATER, Disp: 12 tablet, Rfl: 3    allopurinol (ZYLOPRIM) 100 MG tablet, TAKE ONE TABLET BY MOUTH DAILY, Disp: 90 tablet, Rfl: 3    azelastine (ASTELIN) 0.1 % nasal spray, 2 sprays by Nasal route 2 (two) times daily Use in each nostril as directed, Disp: 30 mL, Rfl: 5    budesonide-formoterol (SYMBICORT) 160-4.5 MCG/ACT inhaler, Inhale 2 puffs into the lungs 2 (two) times daily, Disp: 1 each, Rfl: 1  [2]   Allergies  Allergen Reactions    Penicillins    [3]   No current facility-administered medications for this visit.

## 2023-06-02 ENCOUNTER — Encounter (INDEPENDENT_AMBULATORY_CARE_PROVIDER_SITE_OTHER): Payer: Medicare Other | Admitting: Family Medicine

## 2023-06-03 NOTE — Progress Notes (Signed)
Called pt and advised results per Dr. Bauer. Pt verbalized understanding.

## 2023-06-09 ENCOUNTER — Encounter (INDEPENDENT_AMBULATORY_CARE_PROVIDER_SITE_OTHER): Payer: Self-pay | Admitting: Family Medicine

## 2023-06-09 ENCOUNTER — Ambulatory Visit (INDEPENDENT_AMBULATORY_CARE_PROVIDER_SITE_OTHER): Payer: Medicare Other | Admitting: Family Medicine

## 2023-06-09 VITALS — BP 148/78 | HR 71 | Temp 98.0°F | Ht 61.0 in | Wt 136.8 lb

## 2023-06-09 DIAGNOSIS — H6123 Impacted cerumen, bilateral: Secondary | ICD-10-CM

## 2023-06-09 DIAGNOSIS — J309 Allergic rhinitis, unspecified: Secondary | ICD-10-CM

## 2023-06-09 DIAGNOSIS — J4530 Mild persistent asthma, uncomplicated: Secondary | ICD-10-CM

## 2023-06-09 DIAGNOSIS — J4541 Moderate persistent asthma with (acute) exacerbation: Secondary | ICD-10-CM

## 2023-06-09 DIAGNOSIS — J329 Chronic sinusitis, unspecified: Secondary | ICD-10-CM

## 2023-06-09 MED ORDER — PREDNISONE 20 MG PO TABS
40.0000 mg | ORAL_TABLET | Freq: Every day | ORAL | 0 refills | Status: AC
Start: 2023-06-09 — End: 2023-06-14

## 2023-06-09 NOTE — Progress Notes (Signed)
Subjective     Leslie Ray is a 88 y.o. female who presents today for   Chief Complaint   Patient presents with    Dizziness     Pt states her balance is off, slight head congestion, pt states it comes and goes, has been on and off for awhile per pt      Dizziness      Pt was seen at the [facility] and sent to ED on 05/31/2023.  Reports her headache and lightheadedness that brought her to UC has resolved.   But states she has these symptoms off and on for years.  She was referred to ENT DR. Ipachi.   Pt reports she has associated symptoms of nasal congestion/head congestion, and cough.    ED records reviewed.     Pt states her BP at home is good, in the 120s/60-70s.     Have you started any new medications? no  Do you have any hearing loss? Yes, but this is not new.  Does not wear hearing aids.  Does anything trigger the dizziness? Denies, but thinks the cats at her house may be a possible trigger.  How long do the episodes last or is it constant? - few minutes  Nausea/vomiting - no  Ear pain - no  Headache - mild  Spinning sensation - no  Recent URI - no    No problems updated.    Problem List[1]  Current Medications[2]  Allergies[3]    Patient Care Team:  Jules Husbands, DO as PCP - General (Family Medicine)  Loman Chroman, MD as Consulting Physician (Critical Care Medicine)  [employer] as Consulting Physician (Optometry)  Nelda Bucks, MD as Consulting Physician (Cardiology)    Review of Systems   Neurological:  Positive for dizziness.       Objective     BP 148/78 (BP Site: Left arm, Patient Position: Sitting, Cuff Size: Medium)   Pulse 71   Temp 98 F (36.7 C)   Ht 1.549 m (5\' 1" )   Wt 62.1 kg (136 lb 12.8 oz)   SpO2 96%   BMI 25.85 kg/m   Wt Readings from Last 3 Encounters:   06/09/23 62.1 kg (136 lb 12.8 oz)   05/31/23 62.7 kg (138 lb 3.2 oz)   05/26/23 62.6 kg (138 lb)     Physical Exam  Vitals reviewed.   Constitutional:       General: She is not in acute distress.  HENT:      Right Ear:  Tympanic membrane normal. No middle ear effusion. Tympanic membrane is not injected, erythematous or bulging.      Left Ear: Tympanic membrane normal.  No middle ear effusion. Tympanic membrane is not injected, erythematous or bulging.      Ears:      Comments: Excess cerumen b/l ear canals.        Mouth/Throat:      Pharynx: Oropharynx is clear. No oropharyngeal exudate or posterior oropharyngeal erythema.      Tonsils: No tonsillar exudate.   Eyes:      Conjunctiva/sclera:      Right eye: Right conjunctiva is not injected. No exudate.     Left eye: Left conjunctiva is not injected. No exudate.  Pulmonary:      Effort: Pulmonary effort is normal. No respiratory distress.      Breath sounds: Decreased breath sounds, wheezing and rhonchi present.      Comments: Scattered wheeze  Lymphadenopathy:  Head:      Right side of head: No submental, submandibular, tonsillar, preauricular, posterior auricular or occipital adenopathy.      Left side of head: No submental, submandibular, tonsillar, preauricular, posterior auricular or occipital adenopathy.      Cervical: No cervical adenopathy.      Right cervical: No superficial, deep or posterior cervical adenopathy.     Left cervical: No superficial, deep or posterior cervical adenopathy.   Skin:     Findings: No rash.   Neurological:      Mental Status: She is alert.         Diagnostic Tests:  Renal Function Trend:  Lab Results   Component Value Date    CREAT 0.9 10/28/2022    CREAT 1.0 05/19/2022    CREAT 1.0 03/16/2021    CREAT 1.0 05/28/2020    CREAT 1.1 03/14/2020       Lab Results   Component Value Date    EGFR >60.0 10/28/2022    EGFR 55.0 (A) 05/19/2022    EGFR 52.8 03/16/2021    EGFR 52.9 05/28/2020    EGFR 47.4 03/14/2020       Assessment & Plan     1. Chronic sinusitis, unspecified location  - Allergen Respiratory Profile, [place] ([place],); Future  - CT Sinus W Contrast  - Referral to ENT - EXTERNAL; Future    2. Mild persistent asthma without  complication  - predniSONE (DELTASONE) 20 MG tablet; Take 2 tablets (40 mg) by mouth daily for 5 days  Dispense: 10 tablet; Refill: 0    3. Moderate persistent asthma with acute exacerbation  - predniSONE (DELTASONE) 20 MG tablet; Take 2 tablets (40 mg) by mouth daily for 5 days  Dispense: 10 tablet; Refill: 0    4. Chronic allergic rhinitis  - Referral to ENT - EXTERNAL; Future    5. Excessive cerumen in both ear canals  - Referral to ENT - EXTERNAL; Future      Encouraged proper and regular use of inhaler symbicort  Follow-up with pulmonary  Risk & Benefits of the new medication(s) were explained to the patient (and family) who verbalized understanding & agreed to the treatment plan. Patient (family) encouraged to contact me/clinical staff with any questions/concerns    Follow-up as previously recommended, return sooner as needed.              [1]   Patient Active Problem List  Diagnosis    Gout    GERD (gastroesophageal reflux disease)    Hyperlipidemia    Essential hypertension    Chronic allergic rhinitis    Mild intermittent asthma    Irregular bowel habits    Hypothyroid    Stage 3a chronic kidney disease    Counseling on health promotion and disease prevention    Osteoporosis    Mild persistent asthma without complication    Post-nasal drip   [2]   Current Outpatient Medications   Medication Sig Dispense Refill    acetaminophen (TYLENOL) 160 MG chewable tablet Chew 1 tablet (160 mg) by mouth every 6 (six) hours as needed for Pain      albuterol sulfate HFA (PROVENTIL) 108 (90 Base) MCG/ACT inhaler INHALE TWO PUFFS BY MOUTH EVERY 4 HOURS AS NEEDED FOR SHORTNESS OF BREATH 8.5 g 1    alendronate (FOSAMAX) 70 MG tablet TAKE 1 TABLET BY MOUTH ONCE WEEKLY ON AN EMPTY STOMACH BEFORE BREAKFAST. REMAIN UPRIGHT FOR 30 MINUTES AND TAKE WITH 8 OUNCES OF WATER 12 tablet  3    allopurinol (ZYLOPRIM) 100 MG tablet TAKE ONE TABLET BY MOUTH DAILY 90 tablet 3    amLODIPine (NORVASC) 10 MG tablet TAKE ONE TABLET BY MOUTH DAILY  100 tablet 3    atorvastatin (LIPITOR) 40 MG tablet TAKE ONE TABLET BY MOUTH DAILY 90 tablet 3    azelastine (ASTELIN) 0.1 % nasal spray 2 sprays by Nasal route 2 (two) times daily Use in each nostril as directed 30 mL 5    budesonide-formoterol (SYMBICORT) 160-4.5 MCG/ACT inhaler Inhale 2 puffs into the lungs 2 (two) times daily 1 each 1    calcium-vitamin D (OSCAL-500 + D) 500-200 MG-UNIT per tablet Take 1 tablet by mouth 2 (two) times daily 600mg  (1500mg ) - 200U tab      clobetasol (TEMOVATE) 0.05 % cream       estradiol (ESTRACE) 0.1 MG/GM vaginal cream       fluticasone (FLONASE) 50 MCG/ACT nasal spray 2 sprays by Nasal route daily 16 g 5    hydrocortisone (ANUSOL-HC) 2.5 % rectal cream Place rectally 3 (three) times daily 30 g 0    hydrocortisone (ANUSOL-HC) 25 MG suppository Place 1 suppository (25 mg) rectally 2 (two) times daily 30 suppository 0    Loratadine (CLARITIN PO) Take 5 mg by mouth daily         losartan (COZAAR) 100 MG tablet TAKE ONE TABLET BY MOUTH DAILY 100 tablet 3    meclizine (ANTIVERT) 25 MG tablet Take 1 tablet (25 mg) by mouth every 8 (eight) hours as needed      metoprolol succinate (TOPROL-XL) 100 MG 24 hr tablet TAKE 1 TABLET BY MOUTH DAILY 100 tablet 3    montelukast (SINGULAIR) 10 MG tablet TAKE ONE TABLET BY MOUTH EVERY EVENING 90 tablet 3    Omega-3 Fatty Acids (OMEGA-3 FISH OIL) 500 MG Cap Take 2 capsules (1,000 mg) by mouth 2 (two) times daily      omeprazole (PriLOSEC) 40 MG capsule TAKE ONE CAPSULE BY MOUTH DAILY BEFORE A MEAL 90 capsule 3    polyethylene glycol (MIRALAX) 17 GM/SCOOP powder Take 17 g by mouth daily 225 g 0    Synthroid 75 MCG tablet TAKE 1 TABLET BY MOUTH DAILY 90 tablet 0    vitamins/minerals Tab Take 1 tablet by mouth daily      predniSONE (DELTASONE) 20 MG tablet Take 2 tablets (40 mg) by mouth daily for 5 days 10 tablet 0     No current facility-administered medications for this visit.   [3]   Allergies  Allergen Reactions    Penicillins

## 2023-06-09 NOTE — Progress Notes (Signed)
Have you seen any specialists/other providers since your last visit with [place]?    Urgent Care     The patient is due for:   Health Maintenance Due   Topic Date Due    Advance Directive on File  Never done    Shingrix Vaccine 50+ (2) 05/13/2002    COVID-19 Vaccine (3 - 2023-24 season) 06/25/2022    INFLUENZA VACCINE  05/26/2023

## 2023-06-13 LAB — ALLERGEN RESPIRATORY PROFILE, MID-ATLANTIC (DC,DE,MD,NC,VA)
Allergen Aspergillus Fumigatus, IGE: <0.1 kU/L (ref <0.70)
Allergen Maple Box Elder, IgE: <0.1 kU/L (ref <0.70)
Alternaria Tenuis IGE: <0.1 kU/L (ref <0.70)
Bermuda Grass, IgE: <0.1 kU/L (ref <0.70)
Cat Epithelium, IgE: 0.28 kU/L (ref <0.70)
Cladosporium Herbarum (IgE): <0.1 kU/L (ref <0.70)
Cockroach (i6), IgE: <0.1 kU/L (ref <0.70)
Cottonwood, IgE: <0.1 kU/L (ref <0.70)
Dog Dander IGE: <0.1 kU/L (ref <0.70)
Elm IgE: <0.1 kU/L (ref <0.70)
House Dust Mites/D.F IGE: <0.1 kU/L (ref <0.70)
House Dust Mites/D.P., IGE: <0.1 kU/L (ref <0.70)
Immunoglobulin E: 48.9 kU/L (ref <=214)
Johnson Grass, IgE: <0.1 kU/L (ref <0.70)
Mountain Cedar IgE: <0.1 kU/L (ref <0.70)
Mulberry IGE: <0.1 kU/L (ref <0.70)
Pecan Hickory IGE: <0.1 kU/L (ref <0.70)
Penicillium, IGE: <0.1 kU/L (ref <0.70)
Red Sorrel IGE: <0.1 kU/L (ref <0.70)
Rough Pigweed, IgE: <0.1 kU/L (ref <0.70)
Short Ragweed, IgE: <0.1 kU/L (ref <0.70)
Silver Birch IgE: <0.1 kU/L (ref <0.70)
Timothy Grass, IgE: <0.1 kU/L (ref <0.70)
White Oak(Quercus alba) IgE: <0.1 kU/L (ref <0.70)

## 2023-06-15 ENCOUNTER — Ambulatory Visit (INDEPENDENT_AMBULATORY_CARE_PROVIDER_SITE_OTHER): Payer: Medicare Other | Admitting: Family Medicine

## 2023-06-15 ENCOUNTER — Other Ambulatory Visit (INDEPENDENT_AMBULATORY_CARE_PROVIDER_SITE_OTHER): Payer: Self-pay | Admitting: Family Medicine

## 2023-06-15 DIAGNOSIS — M109 Gout, unspecified: Secondary | ICD-10-CM

## 2023-06-17 NOTE — Progress Notes (Signed)
 Called pt and lmovm for return call to the office.

## 2023-06-21 ENCOUNTER — Telehealth (INDEPENDENT_AMBULATORY_CARE_PROVIDER_SITE_OTHER): Payer: Self-pay | Admitting: Family Medicine

## 2023-06-21 NOTE — Telephone Encounter (Signed)
Patient requesting callback for lab results [phone]

## 2023-06-29 NOTE — Telephone Encounter (Signed)
Called pt and advised results per Dr. Nechama Guard

## 2023-06-29 NOTE — Progress Notes (Signed)
Called pt and advised results per provider. Pt states she has cats in her home and does not have an issue with her allergies. Pt verbalized understanding.

## 2023-06-30 ENCOUNTER — Other Ambulatory Visit (INDEPENDENT_AMBULATORY_CARE_PROVIDER_SITE_OTHER): Payer: Self-pay | Admitting: Family Medicine

## 2023-06-30 ENCOUNTER — Ambulatory Visit (INDEPENDENT_AMBULATORY_CARE_PROVIDER_SITE_OTHER): Payer: Medicare Other | Admitting: Family Medicine

## 2023-07-07 ENCOUNTER — Other Ambulatory Visit (INDEPENDENT_AMBULATORY_CARE_PROVIDER_SITE_OTHER): Payer: Self-pay | Admitting: Family Medicine

## 2023-08-19 ENCOUNTER — Ambulatory Visit (INDEPENDENT_AMBULATORY_CARE_PROVIDER_SITE_OTHER): Payer: Medicare Other | Admitting: Internal Medicine

## 2023-09-21 ENCOUNTER — Ambulatory Visit (INDEPENDENT_AMBULATORY_CARE_PROVIDER_SITE_OTHER): Payer: Medicare Other | Admitting: Internal Medicine

## 2023-09-21 ENCOUNTER — Encounter (INDEPENDENT_AMBULATORY_CARE_PROVIDER_SITE_OTHER): Payer: Self-pay | Admitting: Internal Medicine

## 2023-09-21 VITALS — BP 122/80 | HR 64 | Temp 97.3°F | Resp 16 | Ht 61.0 in | Wt 136.0 lb

## 2023-09-21 DIAGNOSIS — R0982 Postnasal drip: Secondary | ICD-10-CM

## 2023-09-21 DIAGNOSIS — J4530 Mild persistent asthma, uncomplicated: Secondary | ICD-10-CM

## 2023-09-21 DIAGNOSIS — J309 Allergic rhinitis, unspecified: Secondary | ICD-10-CM

## 2023-09-21 DIAGNOSIS — K219 Gastro-esophageal reflux disease without esophagitis: Secondary | ICD-10-CM

## 2023-09-21 DIAGNOSIS — J4520 Mild intermittent asthma, uncomplicated: Secondary | ICD-10-CM

## 2023-09-21 DIAGNOSIS — J42 Unspecified chronic bronchitis: Secondary | ICD-10-CM

## 2023-09-21 MED ORDER — BUDESONIDE-FORMOTEROL FUMARATE 160-4.5 MCG/ACT IN AERO
2.0000 | INHALATION_SPRAY | Freq: Two times a day (BID) | RESPIRATORY_TRACT | 3 refills | Status: AC
Start: 2023-09-21 — End: ?

## 2023-09-21 NOTE — Progress Notes (Signed)
[place] [facility]ULMONOLOGY OFFICE VISIT    Patient Name: Leslie Ray, Leslie Ray  Date of Visit:  09/21/2023  Date of Birth: 03-14-1935  AGE: 88 y.o.  Medical Record #: [id number]  Requesting Physician: Jules Husbands, DO       HISTORY OF PRESENT ILLNESS     Leslie Ray is a 88 y.o.  female  has a past medical history of allergies, anemia, arthritis, asthma, GERD, gout, hyperglycemia, HLD, HTN, murmur and vertigo seen today for follow up.     Initial Consult on 11/06/2018     The patient was recently admitted to [facility] on December 4th 2019 for chest pain and shortness of breath. Chest x-ray report revealed mild hyperinflation and increased interstitial markings but no consolidation or pleural effusions. Note, I cannot review these images directly. She was treated for a presumed COPD exacerbation given wheezing and with steroids and antibiotics. She was discharged on azithromycin, prednisone, and Singulair. She has improved steadily with this and has essentially returned to her baseline as of today's visit.     At baseline, she does report some dyspnea with exertion, though this is generally mild. She does not exercise per se, but does not have limitation from dyspnea. She has no prior history of asthma or COPD, but does report occasional wheezing over the years. Since starting Singulair December, this has helped this. She has had 2 prior pneumonias in the past. She has a history of secondhand smoke, but has never been a smoker. She does have a history of allergies and chronic postnasal drainage. It is not clear which seasons are particularly worse for her, but her allergies are generally mild as well.     Interval History     03/17/2023  Since her last visit, the patient has been stable overall.  She is compliant with Symbicort HFA. She notices increasing shortness of breath with associated wheezing when she does not use it. Her main complaint  is regarding her sinus drainage and chest congestion. She  has difficulty expectorating her secretions. She finds minimal benefits from Singulair, but not from Flonase nasal spray. She states Flonase causes her nasal cavity to have a burning sensation. She has not had any recent flare ups which led to requiring oral steroids or antibiotics. She denies fevers and chills.     09/21/2023  She reports she has had good and bad days since her last visit 6 months ago but overall is doing well.  She is living at a new residence with no pets and is not wheezing as a result.  Today she states she coughs some, that its not bothersome or productive of any increased volume of sputum.  She states that "once and a while" she will have some increased purulent sputum that responds well to short bursts of steroids prescribed by her PCP.  She takes Symbicort 160, 2 puffs twice a day. She uses nasal sprays sometimes, not often.     She endorses she is pleased with her asthma control and is able to manage symptoms with her current regime.     She has some LE edema on her left leg, otherwise denies any signs of acute symptoms of respiratory infection or process.     PHYSICAL EXAM:  Vitals:    09/21/23 0823   BP: 122/80   Pulse: 64   Resp: 16   Temp: 97.3 F (36.3 C)   SpO2: 97%        Physical Exam  Constitutional:  Appearance: She is normal weight.   HENT:      Nose: Congestion present.   Cardiovascular:      Rate and Rhythm: Normal rate and regular rhythm.      Pulses: Normal pulses.      Heart sounds: Normal heart sounds.   Pulmonary:      Effort: Pulmonary effort is normal.      Breath sounds: Normal breath sounds.   Neurological:      General: No focal deficit present.      Mental Status: She is alert and oriented to person, place, and time. Mental status is at baseline.     Current Outpatient Medications   Medication Sig Dispense Refill    acetaminophen (TYLENOL) 160 MG chewable tablet Chew 1 tablet (160 mg) by mouth every 6 (six) hours as needed for Pain      albuterol sulfate HFA  (PROVENTIL) 108 (90 Base) MCG/ACT inhaler INHALE TWO PUFFS BY MOUTH EVERY 4 HOURS AS NEEDED FOR SHORTNESS OF BREATH 8.5 g 1    alendronate (FOSAMAX) 70 MG tablet TAKE 1 TABLET BY MOUTH ONCE WEEKLY ON AN EMPTY STOMACH BEFORE BREAKFAST. REMAIN UPRIGHT FOR 30 MINUTES AND TAKE WITH 8 OUNCES OF WATER 12 tablet 3    allopurinol (ZYLOPRIM) 100 MG tablet TAKE 1 TABLET BY MOUTH DAILY 100 tablet 3    amLODIPine (NORVASC) 10 MG tablet TAKE ONE TABLET BY MOUTH DAILY 100 tablet 3    atorvastatin (LIPITOR) 40 MG tablet TAKE 1 TABLET BY MOUTH DAILY 100 tablet 3    azelastine (ASTELIN) 0.1 % nasal spray 2 sprays by Nasal route 2 (two) times daily Use in each nostril as directed 30 mL 5    budesonide-formoterol (SYMBICORT) 160-4.5 MCG/ACT inhaler Inhale 2 puffs into the lungs 2 (two) times daily 1 each 1    calcium-vitamin D (OSCAL-500 + D) 500-200 MG-UNIT per tablet Take 1 tablet by mouth 2 (two) times daily 600mg  (1500mg ) - 200U tab      clobetasol (TEMOVATE) 0.05 % cream       estradiol (ESTRACE) 0.1 MG/GM vaginal cream       fluticasone (FLONASE) 50 MCG/ACT nasal spray 2 sprays by Nasal route daily 16 g 5    hydrocortisone (ANUSOL-HC) 2.5 % rectal cream Place rectally 3 (three) times daily 30 g 0    hydrocortisone (ANUSOL-HC) 25 MG suppository Place 1 suppository (25 mg) rectally 2 (two) times daily 30 suppository 0    Loratadine (CLARITIN PO) Take 5 mg by mouth daily         losartan (COZAAR) 100 MG tablet TAKE ONE TABLET BY MOUTH DAILY 100 tablet 3    meclizine (ANTIVERT) 25 MG tablet Take 1 tablet (25 mg) by mouth every 8 (eight) hours as needed      metoprolol succinate (TOPROL-XL) 100 MG 24 hr tablet TAKE 1 TABLET BY MOUTH DAILY 100 tablet 3    montelukast (SINGULAIR) 10 MG tablet TAKE ONE TABLET BY MOUTH EVERY EVENING 90 tablet 3    Omega-3 Fatty Acids (OMEGA-3 FISH OIL) 500 MG Cap Take 2 capsules (1,000 mg) by mouth 2 (two) times daily      omeprazole (PriLOSEC) 40 MG capsule TAKE ONE CAPSULE BY MOUTH DAILY BEFORE A MEAL  90 capsule 3    polyethylene glycol (MIRALAX) 17 GM/SCOOP powder Take 17 g by mouth daily 225 g 0    Synthroid 75 MCG tablet TAKE 1 TABLET BY MOUTH DAILY 90 tablet 0    vitamins/minerals Tab  Take 1 tablet by mouth daily       No current facility-administered medications for this visit.        Spiro/NIOX/6MWT/PFT Data:  Spirometry on 09/21/2023: FVC: 1.77 (82%), FEV1: 1.31 (81%), FEV1/FVC: 74% - Normal    Spirometry on 08/24/2021: FVC: 1.93 (92%), FEV1: 1.45 (91%), FEV1/FVC: 75%--Normal     Spirometry on 06/04/2021: FVC 1.98 (94%), FEV1 1.55 (97%), FEV1/FVC 79%-- Normal     Spirometry on 04/23/2021: FEV1 = 1.45 (91%), FVC = 1.84 (88%), FEV1/FVC 78% - normal     Laboratory and Chest Images:  Lab Results   Component Value Date    WBC 10.77 (H) 05/19/2022    HGB 11.8 05/19/2022    HCT 35.0 05/19/2022    PLT 358 (H) 05/19/2022    ABSEOSAUTOMA 0.72 (H) 05/19/2022      Lab Results   Component Value Date    GLU 97 10/28/2022    ALKPHOS 66 05/19/2022    AST 22 05/19/2022    ALT 16 05/19/2022    CREAT 0.9 10/28/2022    CO2 24 10/28/2022     In-Clinic CXR on 03/18/2023: personally reviewed and demonstrated, no acute cardiopulmonary process     CXR on 03/22/2019, personally reviewed and demonstrated, Mild cardiomegaly. Emphysematous changes. Left basilar atelectasis or scarring.         ASSESSMENT DETAILS:  Jeff Mccallum  is a 88 y.o.  female seen today for the following:    Mild persistent asthma   Stable overall - clearly benefits from Symbicort, can access and adhere to prescription without any problem.  No recent asthma exacerbations    Chronic allergic rhinitis  On Singulair and Claritin daily  Discussed she may benefit from azelastine nasal spray.    Gastroesophageal reflux disease    Stage 3a chronic kidney disease    Essential hypertension    Hyperlipidemia      PLAN DETAILS:  Continue Symbicort 160 mcg 2 puffs BID--Pt is aware to gargle with water, spit, then brush teeth after each use to prevent thrush  Continue  ProAir Digihaler 2 puffs every 4-6 hours prn for shortness of breath, chest tightness, or wheezing   Continue Singulair 10 mg QHS   Continue Claritin QD  Start azelastine nasal spray - 2 sprays each nostril BID     Return in about 6 months (around 03/20/2024). Reviewed warning signs for worsening condition and indications and instructions for scheduling a sooner, urgent follow-up appointment.      45 mins of total time spent in the evaluation and management of this patient excluding any teaching or procedures.    I personally scribed for Loman Chroman, MD on 09/21/2023. The documentation recorded by the scribe, Karen Kitchens, accurately reflects the service I personally performed, and the decisions made by me, Loman Chroman, MD, 09/21/2023.    SIGNED:   Loman Chroman, MD  [facility] Pulmonology

## 2023-09-23 ENCOUNTER — Other Ambulatory Visit (INDEPENDENT_AMBULATORY_CARE_PROVIDER_SITE_OTHER): Payer: Self-pay | Admitting: Family Medicine

## 2023-09-23 LAB — SPIROMETRY
FEF 25-75% (Pre-Bronch) %Pred: 71 %
FEF 25-75% (Pre-Bronch) Actual: 0.94 L/s
FEF 25-75% (Pre-Bronch) Pred: 1.31 L/s
FEF Max (Pre-Bronch) %Pred: 120 %
FEF Max (Pre-Bronch) Actual: 4.5 L/s
FEF Max (Pre-Bronch) Pred: 3.73 L/s
FEF2575 (Lower Limit of Normal): 0.51 L/s
FEF2575 (Standard Deviation): 0.62 L/s
FEFMax (Lower Limit of Normal): 2.17 L/s
FEFMax (Standard Deviation): 0.94 L/s
FEV1 (Lower Limit of Normal): 1.1 L
FEV1 (Pre-Bronch) %Pred: 81 %
FEV1 (Pre-Bronch) Actual: 1.31 L
FEV1 (Pre-Bronch) Pred: 1.62 L
FEV1 (Standard Deviation): 0.3 L
FEV1/FVC (Pre-Bronch) %Pred: 96 %
FEV1/FVC (Pre-Bronch) Actual: 74 %
FEV1/FVC (Pre-Bronch) Pred: 77 %
FVC (Lower Limit of Normal): 1.46 L
FVC (Pre-Bronch) %Pred: 82 %
FVC (Pre-Bronch) Actual: 1.77 L
FVC (Pre-Bronch) Pred: 2.14 L
FVC (Standard Deviation): 0.43 L
PEF (Pre-Actual): 269.8 L/min

## 2023-09-29 NOTE — Telephone Encounter (Signed)
Patient scheduled.

## 2023-11-16 ENCOUNTER — Ambulatory Visit (INDEPENDENT_AMBULATORY_CARE_PROVIDER_SITE_OTHER): Payer: Medicare Other | Admitting: Family Medicine

## 2023-12-27 ENCOUNTER — Other Ambulatory Visit (INDEPENDENT_AMBULATORY_CARE_PROVIDER_SITE_OTHER): Payer: Self-pay | Admitting: Family Medicine

## 2023-12-27 NOTE — Telephone Encounter (Signed)
 Last filled December 2024.   Last o/v April 2024.  Patient does not have an upcoming appointment and notified appointment needed.  Queued up 90 with 0 refills.

## 2023-12-28 NOTE — Telephone Encounter (Signed)
 LVM for patient to schedule appointment.

## 2024-01-19 ENCOUNTER — Ambulatory Visit (INDEPENDENT_AMBULATORY_CARE_PROVIDER_SITE_OTHER): Admitting: Family Medicine

## 2024-01-19 ENCOUNTER — Encounter (INDEPENDENT_AMBULATORY_CARE_PROVIDER_SITE_OTHER): Payer: Self-pay | Admitting: Family Medicine

## 2024-01-19 VITALS — BP 159/86 | HR 75 | Temp 97.9°F | Ht 61.0 in | Wt 136.0 lb

## 2024-01-19 DIAGNOSIS — E039 Hypothyroidism, unspecified: Secondary | ICD-10-CM

## 2024-01-19 DIAGNOSIS — E785 Hyperlipidemia, unspecified: Secondary | ICD-10-CM

## 2024-01-19 DIAGNOSIS — Z23 Encounter for immunization: Secondary | ICD-10-CM

## 2024-01-19 DIAGNOSIS — M810 Age-related osteoporosis without current pathological fracture: Secondary | ICD-10-CM

## 2024-01-19 DIAGNOSIS — Z0000 Encounter for general adult medical examination without abnormal findings: Secondary | ICD-10-CM

## 2024-01-19 DIAGNOSIS — H6122 Impacted cerumen, left ear: Secondary | ICD-10-CM

## 2024-01-19 DIAGNOSIS — I10 Essential (primary) hypertension: Secondary | ICD-10-CM

## 2024-01-19 DIAGNOSIS — Z7189 Other specified counseling: Secondary | ICD-10-CM

## 2024-01-19 DIAGNOSIS — M109 Gout, unspecified: Secondary | ICD-10-CM

## 2024-01-19 LAB — COMPREHENSIVE METABOLIC PANEL
ALT: 22 U/L (ref <=55)
AST (SGOT): 29 U/L (ref <=41)
Albumin/Globulin Ratio: 1.3 (ref 0.9–2.2)
Albumin: 4.4 g/dL (ref 3.5–5.0)
Alkaline Phosphatase: 67 U/L (ref 37–117)
Anion Gap: 12 (ref 5.0–15.0)
BUN: 18 mg/dL (ref 7–21)
Bilirubin, Total: 0.5 mg/dL (ref 0.2–1.2)
CO2: 22 meq/L (ref 17–29)
Calcium: 9.7 mg/dL (ref 7.9–10.2)
Chloride: 104 meq/L (ref 99–111)
Creatinine: 1 mg/dL (ref 0.4–1.0)
GFR: 54.2 mL/min/{1.73_m2} — ABNORMAL LOW (ref >=60.0)
Globulin: 3.3 g/dL (ref 2.0–3.6)
Glucose: 94 mg/dL (ref 70–100)
Hemolysis Index: 4 {index}
Potassium: 4.5 meq/L (ref 3.5–5.3)
Protein, Total: 7.7 g/dL (ref 6.0–8.3)
Sodium: 138 meq/L (ref 135–145)

## 2024-01-19 LAB — LIPID PANEL
Cholesterol / HDL Ratio: 4.8 {index}
Cholesterol: 198 mg/dL (ref <=199)
HDL: 41 mg/dL (ref >=40)
LDL Calculated: 121 mg/dL — ABNORMAL HIGH (ref 0–99)
Triglycerides: 179 mg/dL — ABNORMAL HIGH (ref 34–149)
VLDL Calculated: 36 mg/dL (ref 10–40)

## 2024-01-19 LAB — URIC ACID: Uric Acid: 5.9 mg/dL (ref 2.6–7.1)

## 2024-01-19 LAB — THYROID STIMULATING HORMONE (TSH) WITH REFLEX TO FREE T4: TSH: 16.62 u[IU]/mL — ABNORMAL HIGH (ref 0.35–4.94)

## 2024-01-19 LAB — T4, FREE: T4 Free: 0.85 ng/dL (ref 0.69–1.48)

## 2024-01-19 NOTE — Assessment & Plan Note (Addendum)
 Lab Results   Component Value Date    TSH 2.19 05/19/2022    T4FREE 1.11 11/09/2019     Pt is clinically euthyroid.  Prior TFT's are within target range. Continue current thyroid  replacement medication regimen. Obtain surveillance TFT's today.  Reviewed hypo and hyperthyroid symptoms that warrant sooner evaluation            Orders:    Thyroid  Stimulating Hormone (TSH) with Reflex to Free T4; Future

## 2024-01-19 NOTE — Assessment & Plan Note (Addendum)
 Current blood pressure is adequately controlled on the current medication regimen. Ambulatory blood pressures indicate adequate control of blood pressure compared to office blood pressure measurements. Continue to optimize low salt diet and aerobic exercise efforts. and Continue current medication regimen. Recommend optimizing therapeutic lifestyle changes which include obtaining at least 150 minutes of aerobic exercise per week and eating a heart healthy diet ( i.e. DASH diet - www.heart.org or popsteam.is ).  Cardiology surveillance is UTD.      Orders:    Comprehensive Metabolic Panel; Future

## 2024-01-19 NOTE — Progress Notes (Signed)
 [facility]   Medicare Wellness Visit                 Leslie Ray is a 89 y.o. female who presents today for the following Medicare Wellness Visit:  []  Initial Preventive Physical Exam (IPPE) - Welcome to Medicare preventive visit (Vision Screening required)   []  Annual Wellness Visit - Initial  [x]  Annual Wellness Visit - Subsequent                                                                                                                                                     Health Risk Assessment   During the past month, how would you rate your general health?:  Very Good  Which of the following tasks can you do without assistance - drive or take the bus alone; shop for groceries or clothes; prepare your own meals; do your own housework/laundry; handle your own finances/pay bills; eat, bathe or get around your home?: Drive or take the bus alone, Handle your own finances/pay bills, Shop for groceries or clothes, Eat, bathe, dress or get around your home, Prepare your own meals, Do your own housework/laundry  Which of the following problems have you been bothered by in the past month - dizzy when standing up; problems using the phone; feeling tired or fatigued; moderate or severe body pain?: Feeling tired or fatigued, Moderate or severe body pain  Do you exercise for about 20 minutes 3 or more days per week?:Yes (walking)  During the past month was someone available to help if you needed and wanted help?  For example, if you felt nervous, lonely, got sick and had to stay in bed, needed someone to talk to, needed help with daily chores or needed help just taking care of yourself.: Yes  Do you always wear a seat belt?: Yes  Do you have any trouble taking medications the way you have been told to take them?: No  Have you been given any information that can help you with keeping track of your medications?: Yes  Do you have trouble paying for your medications?: No  Have you been given any information  that can help you with hazards in your house, such as scatter rugs, furniture, etc?: Yes  Do you feel unsteady when standing or walking?: Yes (sometimes)  Do you worry about falling?: Yes  Have you fallen two or more times in the past year?: No  Did you suffer any injuries from your falls in the past year?: No    Hospitalizations   Hospitalization within past year: [x]  No  []  Yes     Diagnosis:    Screenings         06/09/2023 09/21/2023 01/19/2024   Ambulatory Screenings   Falls Risk: Terrilee more than 2 times in past year   N  Falls Risk: Suffer any injuries?   N   Depression: PHQ2 Total Score 0 0    Depression: PHQ9 Total Score 0 0         Substance Use Disorder Screen:  In the past year, how often have you used the following?  1) Alcohol (For men, 5 or more drinks a day. For women, 4 or more drinks a day)  [x]  Never []  Once or Twice []  Monthly []  Weekly []  Daily or Almost Daily  2) Tobacco Products  [x]  Never []  Once or Twice []  Monthly []  Weekly []  Daily or Almost Daily  3) Prescription Drugs for Non-Medical Reasons  [x]  Never []  Once or Twice []  Monthly []  Weekly []  Daily or Almost Daily  4) Illegal Drugs  [x]  Never []  Once or Twice []  Monthly []  Weekly []  Daily or Almost Daily            Functional Ability/Level of Safety   Falls Risk/Home Safety Assessment:  (see HRA and Screenings sections for additional assessment)    Home Safety:   []  Low Risk for Falls  [x]  Skid-resistant rugs/remove throw rugs   [x]  Grab bars    [x]  Clear pathways between rooms    [x]  Proper lighting stairs/ bathrooms/bedrooms    Get Up and Go (optional):    []   <20 secs    [x]   >20 secs    [x]   High risk for falls - Home Safety/Falls Risk Precautions reviewed with pt/family  Gets tired when walking longer distances and so uses a cane at times also for  balance.    Hearing Assessment:  Concerns for hearing loss: [x]  Yes  []   No  see below  Hearing aids:   []   Right  []   Left  []   Bilateral   [x]   None  Whisper Test (optional):  []  Normal   []   Slightly decreased  []   Significantly decreased    Visual Acuity:     If no visual acuity exam above:  [] Patient Declines  [x]  Up to date with Optometry/Ophthalmology per patient report.    Exercise:  Frequency:  [x]   No formal exercise  []   1-2x/wk  []   3-4x/wk  []   >4x/wk  Duration:  []   15-30 mins/day  []   30-45 mins/day  []   45+ mins/day  Intensity:  []   Light  []   Moderate  []   Heavy  Pt reports no formal or intentional exercise   States she does household chores and house work    Diet:  Diet: - inconsistent compliance with heart healthy and well balanced diet, low salt diet, and low fat diet      Activities of Daily Living     ADL's Independent Minimal  Assistance Moderate  Assistance Total   Assistance   Bathing [x]  []  []  []    Dressing [x]  []  []  []    Mobility   [x]  []  []  []    Transfer [x]  []  []  []    Eating [x]  []  []  []    Toileting [x]  []  []  []      IADL's Independent Minimal  Assistance Moderate  Assistance Total   Assistance   Phone [x]  []  []  []    Housekeeping [x]  []  []  []    Laundry [x]  []  []  []    Transportation []  Pt does not drive []  []  [x]    Medications [x]  []  []  []    Finances [x]  []  []  []       Lives with sister -in-law    ADL assistance:   []   No assistance needed    []  Spouse    []  Sibling    [x]  Son   []  Daughter   []  Children    []  Home Health Aide   []  Other:    Social Activities/Engagement   Frequency of Communication with Friends and Family:  []  Never []  Rarely [x]  Sometimes []  Often[]  Very often    Frequency of Social Gatherings with Friends and Family:   []  Never [x]  Rarely []  Sometimes []  Often[]  Very often    Advanced Care Planning   Discussion of Advance Directives:   []  Advance Directive in chart    [x]  Advance Directive not in chart - requested to provide   []  No Advance Directive.  Form Provided    []  No Advance Directive.  Pt declines.   []  Not addressed today    []  Other:         Exam   BP 159/86 (BP Site: Left arm, Patient Position: Sitting, Cuff Size: Medium)   Pulse 75   Temp 97.9  F (36.6 C)   Ht 1.549 m (5' 1)   Wt 61.7 kg (136 lb)   BMI 25.70 kg/m   Physical Exam  HENT:      Head:      Comments: Ear irrigated with cerumen removed.         Right Ear: Tympanic membrane normal. There is impacted cerumen.      Left Ear: Tympanic membrane normal.             Evaluation of Cognitive Function   Mood/affect: [x]  Appropriate  []   Other:   Appearance: [x]  Neatly groomed  [x]  Adequately nourished  []  Other:  Family member/caregiver input: []  Present - no concerns  [x]   Not present in room  []  Present - concerns:    Cognitive Assessment:  Mini-Cog Result (three word registration- banana, sunrise, chair / clock drawing):   []   > 3 points - negative screen for dementia   []  3 recalled words - negative screen for dementia   [x]  1-2 recalled words and normal clock draw - negative for cognitive impairment   []  1-2 recalled words and abnormal clock draw - positive for cognitive impairment   []  0 recalled words - positive for cognitive impairment         Personalized Prevention Plan   The patient was provided with a personalized prevention plan via   a printed copy                                                                                                                                                 Assessment/Plan     Assessment & Plan  Medicare annual wellness visit, subsequent         Need for pneumococcal vaccine    Orders:    Pneumococcal Conjugate 20 -  Valent    Hyperlipidemia, unspecified hyperlipidemia type  Lab Results   Component Value Date    CHOL 149 05/19/2022    CHOL 143 03/16/2021    TRIG 159 (H) 05/19/2022    TRIG 161 (H) 03/16/2021    HDL 30 (L) 05/19/2022    HDL 36 (L) 03/16/2021    LDL 87 05/19/2022    LDL 75 03/16/2021     Recommend patient consume a healthy diet that emphasizes the intake of vegetables, fruits, nuts, whole grains, lean vegetable or animal protein, and fish and minimizes the intake of trans fats, red meat and process meats, refined carbohydrates, and sugar  sweetened beverages.  Recommend patient engage in at least 150 minutes per week of accumulated moderate-intensity physical activity or 75 minutes per week of vigorous-intensity physical activity.  Prior LDL values within acceptable limits on appropriate intensity statin medication.  Continue current medication therapy.  Obtain lipid indices today.  Continue to optimize a heart healthy diet and aerobic exercise efforts.      Orders:    Lipid Panel; Future    Comprehensive Metabolic Panel; Future    Essential hypertension  Current blood pressure is adequately controlled on the current medication regimen. Ambulatory blood pressures indicate adequate control of blood pressure compared to office blood pressure measurements. Continue to optimize low salt diet and aerobic exercise efforts. and Continue current medication regimen. Recommend optimizing therapeutic lifestyle changes which include obtaining at least 150 minutes of aerobic exercise per week and eating a heart healthy diet ( i.e. DASH diet - www.heart.org or popsteam.is ).  Cardiology surveillance is UTD.      Orders:    Comprehensive Metabolic Panel; Future    Hypothyroidism, unspecified type  Lab Results   Component Value Date    TSH 2.19 05/19/2022    T4FREE 1.11 11/09/2019     Pt is clinically euthyroid.  Prior TFT's are within target range. Continue current thyroid  replacement medication regimen. Obtain surveillance TFT's today.  Reviewed hypo and hyperthyroid symptoms that warrant sooner evaluation            Orders:    Thyroid  Stimulating Hormone (TSH) with Reflex to Free T4; Future    Osteoporosis, unspecified osteoporosis type, unspecified pathological fracture presence         Gout of foot, unspecified cause, unspecified chronicity, unspecified laterality  Quiescent, no interim flare  Continue allopurinol   Continue low purine diet.   Check uric acid    Orders:    Comprehensive Metabolic Panel; Future    Uric Acid; Future    Impacted cerumen of  left ear         Counseling on health promotion and disease prevention           Reminder to get shingles vaccination.  Covid vaccination twice a year.   RSV vaccination        History/Care Team   Patient Care Team:  Beola Tawni LABOR, DO as PCP - General (Family Medicine)  Wilfrid Oneil PARAS, MD as Consulting Physician (Critical Care Medicine)  [facility] as Consulting Physician (Optometry)  Claudene Katheren BROCKS, MD as Consulting Physician (Cardiology)  [x]  Medical, surgical, family history reviewed and updated during this encounter  [x]  Medication list updated and reconciled during this encounter    Additional Documentation      [facility]   OFFICE VISIT            HPI     Chief Complaint   Patient presents  with    Medicare Annual Wellness Visit - Subsequent    Hyperlipidemia    Hypertension    Hypothyroidism    Gout    Hearing Loss      Hyperlipidemia    Hypertension    Hypothyroidism      Pt noticed popping and cracking from the right ear and used an OTC cerumen removal but states nothing came out but feels has had reduced hearing since she tried treating it.   Problem   Osteoporosis    05/13/2020 DEXA:  IMPRESSION:    Osteoporosis of the left femoral neck. Osteopenia of the  total left hip and lumbar spine.    03/04/2023  DEXA- Osteopenia    Med:  Alendronate  70 mg weekly (rx'd 05/29/2020)  05/19/2022- pt reports sometimesshe skips alendronate  because of difficulty coordinating taking with levothyroxine .       Renal Function Trend:  Lab Results   Component Value Date    CREAT 0.9 10/28/2022    CREAT 1.0 05/19/2022    CREAT 1.0 03/16/2021    CREAT 1.0 05/28/2020    CREAT 1.1 03/14/2020       Lab Results   Component Value Date    EGFR >60.0 10/28/2022    EGFR 55.0 (A) 05/19/2022    EGFR 52.8 03/16/2021    EGFR 52.9 05/28/2020    EGFR 47.4 03/14/2020          Counseling On Health Promotion and Disease Prevention      Pt reports cost is too much for shingles vaccine and was waiting for the cost to go  down    01/19/2024- Discussed breast cancer screening and guidelines, patient prefers/declines further screening     Hypothyroid    Med:  Levothyroxine  (Synthroid ) brand only 75 mcg daily (since 1989 when had partial thyroidectomy)  Pt takes synthroid  in the AM and waits at least 3-4 hours before she eats breakfast or takes any other vitamins/medications    Compliant with current regimen.  Denies side effects to therapy.  Denies any change in hair/nails/skin, constipation, unexplained wt changes.      Lab Results   Component Value Date    TSH 2.19 05/19/2022    TSH 3.15 03/16/2021    TSH 3.86 11/09/2019    TSH 5.04 (H) 08/08/2019    TSH 0.55 02/12/2019          Hyperlipidemia     On atorvastatin  40 mg daily      Pt has hyperlipidemia and is compliant with anti-lipidemic therapy.  Pt denies side effects of anti-lipidemic therapy - specifically denies myalgia.        PA: pt does light housekeeping twice a week.  Also walks, especially when weather is nicer    Lab Results   Component Value Date    CHOL 149 05/19/2022    CHOL 143 03/16/2021    CHOL 165 03/14/2020     Lab Results   Component Value Date    HDL 30 (L) 05/19/2022    HDL 36 (L) 03/16/2021    HDL 36 (L) 03/14/2020     Lab Results   Component Value Date    LDL 87 05/19/2022    LDL 75 03/16/2021    LDL 85 03/14/2020     Lab Results   Component Value Date    TRIG 159 (H) 05/19/2022    TRIG 161 (H) 03/16/2021    TRIG 222 (H) 03/14/2020          Essential  Hypertension    Med:  Amlodipine  10 mg   Toprol  Xl 50 mg daily - increase to 100 mg 11/14/2019  Losartan  100 mg daily    02/04/2023:  NM MYOCARDIAL PERFUSION SPECT W STRESS AND REST   Summary    1. No significant ischemia nor infarct seen.    2. Normal wall motion and systolic function, calculated EF greater than 70%.    3. Worsening of baseline inferolateral ST changes suggestive of ischemia.    4. Isolated PVCs.    5. No prior studies are available for comparison.     01/11/2023: ECHO ADULT TTE COMPLETE   Summary     * The left ventricle is normal in size.    * Left ventricular ejection fraction is normal with an estimated ejection  fraction of 60-65%.    * The right ventricular cavity size is normal in size.    * Normal right ventricular systolic function.    * There is moderate thickening and calcification of the aortic leaflets.    * There is mild to moderate mitral regurgitation.    * There is mild tricuspid regurgitation.    * No pulmonary hypertension with estimated right ventricular systolic  pressure of  31 mmHg.    * There is evidence of aortic plaque in the aortic root.    Occasional shortness of breath when carrying heavy items.   Pt has long history of dizziness, however not recently  At end of day has very slight swelling in legs.       Pt is compliant with current medication. No adverse effects reported to medication and compliance is reported to be good.     11/11/2022 - Pt reports she changed the times she takes her BP medication and does not have the swimmy headache that she had before.    Pt reports occasional shortness of breath with daily activities ongoing for years, no notable change.   Pt denies CP/chest discomfort, palpitations, dizziness (resolved), syncope or near syncope, PND or edema.  Pt prefers not to lay flat for years now. She states she feels nasal drainage and short of breath.  She uses an adjustable bed and keeps her head propped up.  This has not changed for years.     Home BP:    Patient reports that her BP is running 120-130/70s at home    Renal Function Trend:  Lab Results   Component Value Date    CREAT 0.9 10/28/2022    CREAT 1.0 05/19/2022    CREAT 1.0 03/16/2021    CREAT 1.0 05/28/2020    CREAT 1.1 03/14/2020       Lab Results   Component Value Date    EGFR >60.0 10/28/2022    EGFR 55.0 (A) 05/19/2022    EGFR 52.8 03/16/2021    EGFR 52.9 05/28/2020    EGFR 47.4 03/14/2020       Lab Results   Component Value Date    WBC 10.77 (H) 05/19/2022    HGB 11.8 05/19/2022    HCT 35.0 05/19/2022     MCV 91.9 05/19/2022    PLT 358 (H) 05/19/2022          Gout       Flare has occurred on both feet. Has been ~ 5 years ago      On allopurinol  100 mg daily    No interim flare    Lab Results   Component Value Date    URICACID 5.4 05/19/2022  URICACID 5.2 03/16/2021    URICACID 5.9 03/14/2020    URICACID 7.3 (H) 11/09/2019    URICACID 6.0 08/08/2019              ROS   Review of Systems     Vital Signs   BP 159/86 (BP Site: Left arm, Patient Position: Sitting, Cuff Size: Medium)   Pulse 75   Temp 97.9 F (36.6 C)   Ht 1.549 m (5' 1)   Wt 61.7 kg (136 lb)   BMI 25.70 kg/m     Physical Exam   See Above [employer]     Assessment/Plan   1. Medicare annual wellness visit, subsequent    2. Need for pneumococcal vaccine  - Pneumococcal Conjugate 20 - Valent    3. Hyperlipidemia, unspecified hyperlipidemia type  - Lipid Panel; Future  - Comprehensive Metabolic Panel; Future  - Lipid Panel  - Comprehensive Metabolic Panel    4. Essential hypertension  - Comprehensive Metabolic Panel; Future  - Comprehensive Metabolic Panel    5. Hypothyroidism, unspecified type  - Thyroid  Stimulating Hormone (TSH) with Reflex to Free T4; Future  - Thyroid  Stimulating Hormone (TSH) with Reflex to Free T4    6. Osteoporosis, unspecified osteoporosis type, unspecified pathological fracture presence    7. Gout of foot, unspecified cause, unspecified chronicity, unspecified laterality  - Comprehensive Metabolic Panel; Future  - Uric Acid; Future  - Comprehensive Metabolic Panel  - Uric Acid    8. Impacted cerumen of left ear  Ear irrigated today, patient tolerated well and cerumen was removed and TM visualized.   Advised patient to return if still notices loss of hearing.     9. Counseling on health promotion and disease prevention     Hyperlipidemia  Lab Results   Component Value Date    CHOL 149 05/19/2022    CHOL 143 03/16/2021    TRIG 159 (H) 05/19/2022    TRIG 161 (H) 03/16/2021    HDL 30 (L) 05/19/2022    HDL 36 (L) 03/16/2021     LDL 87 05/19/2022    LDL 75 03/16/2021     Recommend patient consume a healthy diet that emphasizes the intake of vegetables, fruits, nuts, whole grains, lean vegetable or animal protein, and fish and minimizes the intake of trans fats, red meat and process meats, refined carbohydrates, and sugar sweetened beverages.  Recommend patient engage in at least 150 minutes per week of accumulated moderate-intensity physical activity or 75 minutes per week of vigorous-intensity physical activity.  Prior LDL values within acceptable limits on appropriate intensity statin medication.  Continue current medication therapy.  Obtain lipid indices today.  Continue to optimize a heart healthy diet and aerobic exercise efforts.      Orders:    Lipid Panel; Future    Comprehensive Metabolic Panel; Future      Essential hypertension  Current blood pressure is adequately controlled on the current medication regimen. Ambulatory blood pressures indicate adequate control of blood pressure compared to office blood pressure measurements. Continue to optimize low salt diet and aerobic exercise efforts. and Continue current medication regimen. Recommend optimizing therapeutic lifestyle changes which include obtaining at least 150 minutes of aerobic exercise per week and eating a heart healthy diet ( i.e. DASH diet - www.heart.org or popsteam.is ).  Cardiology surveillance is UTD.      Orders:    Comprehensive Metabolic Panel; Future      Hypothyroid  Lab Results   Component Value Date  TSH 2.19 05/19/2022    T4FREE 1.11 11/09/2019     Pt is clinically euthyroid.  Prior TFT's are within target range. Continue current thyroid  replacement medication regimen. Obtain surveillance TFT's today.  Reviewed hypo and hyperthyroid symptoms that warrant sooner evaluation            Orders:    Thyroid  Stimulating Hormone (TSH) with Reflex to Free T4; Future      Gout  Quiescent, no interim flare  Continue allopurinol   Continue low purine diet.    Check uric acid    Orders:    Comprehensive Metabolic Panel; Future    Uric Acid; Future      Osteoporosis           Counseling on health promotion and disease prevention         Return in about 4 months (around 05/20/2024) for chronic disease management, Return sooner as needed.  \

## 2024-01-19 NOTE — Patient Instructions (Signed)
 MEDICARE WELLNESS PERSONAL PREVENTION PLAN   As part of the Medicare Wellness portion of your visit today, we are providing you with this personalized preventative plan of care. The list below includes many common screening recommendations from the USPSTF ([place]  Preventive Services Task Force) but is not meant to be comprehensive. You may be eligible for other preventative services depending upon your personal risk factors.     Health Maintenance   Topic Date Due    Advance Directive on File  Never done    Shingrix  Vaccine 50+ (2) 05/13/2002    Medicare Annual Wellness Visit  10/29/2023    COVID-19 Vaccine (4 - 2024-25 season) 02/01/2024    DEPRESSION SCREENING  09/20/2024    FALLS RISK ANNUAL  01/18/2025    Tetanus Ten-Year  05/13/2029    INFLUENZA VACCINE  Completed    DXA Scan  Completed    Pneumonia Vaccine Age 59 Years and Older  Completed     Health Maintenance Topics with due status: Overdue       Topic Date Due    Advance Directive on File Never done    Shingrix  Vaccine 50+ 05/13/2002    Medicare Annual Wellness Visit 10/29/2023      Immunization History   Administered Date(s) Administered    COVID-19 Ad26 vaccine 18 years and above (Janssen) 0.5 mL 01/08/2020    COVID-19 mRNA MONOVALENT vaccine PRIMARY SERIES 12 years and above [employer]) 30 mcg/0.3 mL (DILUTE BEFORE USE) 09/25/2020    COVID-19 mRNA seasonal vaccine 12 years and above [employer]) 30 mcg/0.3 mL 08/03/2023    Hepatitis B (adult dose) 07/21/1992, 08/18/1992, 02/09/1993    INFLUENZA TRIVALENT HIGH DOSE PF 65 YRS AND OLDER (FLUZONE HIGH-DOSE) 08/03/2023    INFLUENZA TRIVALENT PF SD 6 MO AND OLDER (FLUARIX/FLULAVAL) 11/10/2012, 07/30/2013    INFLUENZA TRIVALENT PRESERVED MD 6 MO AND OLDER 11/04/2005    Influenza vaccine quadrivalent high-dose 65 years and older (FLUZONE HIGH-DOSE) single dose 0.7 mL (PF) 09/15/2020, 09/02/2021, 07/09/2022    Influenza vaccine quadrivalent recombinant 18 years and older (FLUBOK) single dose 0.5 mL (PF)  08/14/2019    Influenza vaccine, quadrivalent, 6 months and older (Afluria/Fluzone), multi-dose, 5 mL 09/28/2018    Pneumococcal Conjugate 13-Valent 07/03/2015    Pneumococcal polysaccharide vaccine, 23 valent 03/18/2002    Tdap (tetanus, diphtheria reduced, acellular pertussis), adsorbed vaccine 05/14/2019        Your major risk factors:   Recommendations for improvement:      Colorectal Cancer Screening - All adults 45-75 yrs should undergo periodic colorectal cancer screening. The decision to screen for colorectal cancer in adults aged 57 to 79 years should be an individual one, taking into account your overall health and prior screening history.   Breast Cancer Screening - Women aged 40-41yrs should have mammograms every other year (please note that this recommendation may not be appropriate for every woman - your physician can answer specific questions you may have). The USPSTF concludes that the current evidence is insufficient to assess the balance of benefits and harms of screening mammography in women aged 57 years or older.  These recommendations do not apply to persons who have a genetic marker or syndrome associated with a high risk of breast cancer (eg, BRCA1 or BRCA2 genetic variation), a history of high-dose radiation therapy to the chest at a young age, or previous breast cancer or a high-risk breast lesion on previous biopsies.   Cervical Cancer Screening - Women over 59 do not require pap smears  as long as prior screening has been normal and are not otherwise at high risk for cervical cancer. The USPSTF recommends screening for cervical cancer every 3 years with cervical cytology alone in women aged 68 to 51 years. For women aged 57 to 62 years, the USPSTF recommends screening every 3 years with cervical cytology alone, every 5 years with high-risk human papillomavirus (hrHPV) testing alone, or every 5 years with hrHPV testing in combination with cytology (cotesting).   Osteoporosis Screening -   The USPSTF recommends screening for osteoporosis with bone measurement testing to prevent osteoporotic fractures in women 65 years and older.  For postmenopausal women younger than 65 years who are at increased risk of osteoporosis, as determined by a formal clinical risk assessment tool, the USPSTF recommends screening for osteoporosis with bone measurement testing to prevent osteoporotic fractures.   Hepatitis C Screening - Recommend screening for hepatitis C virus (HCV) infection in all adults aged 77 to 80 years.  Lung cancer Screening - Recommend annual screening for lung cancer with low-dose computed tomography (LDCT) in adults ages 76 to 61 years who have a 20 pack-year smoking history and currently smoke or have quit within the past 15 years.  Recommended Vaccinations from the CDC ([place]  Centers for Disease Control and Prevention)   Influenza one dose annually   COVID vaccine - stay current with the most up-to-date COVID update/booster  Tetanus/diphtheria (Tdap) one booster every 10 years   Zoster/Shingles - (Shingrix ) two doses after age 73 (second dose given 2-6 months after first dose)  Pneumococcal 20-valent or 21-valent conjugate vaccine - one dose for adults aged >=65 years with no history of prior pneumococcal vaccination.  Pneumococcal 20-valent or 21-valent conjugate vaccine -  one dose for adults aged >=65 years with PPSV23 only or PCV13 only given more than 1 yr ago.  Pneumococcal 20-valent or 21-valent conjugate vaccine - one dose for adults aged >=65 years who have completed PCV-13 and PPSV23 after 87yrs old and it has been greater than 77yrs (shared clinical decision-making is recommended regarding administration of this vaccine).   RSV (Respiratory Syncytial Virus) vaccine for everyone ages 42 and older  RSV (Respiratory Syncytial Virus)  vaccine ages 12-74 who are at increased risk of severe RSV disease (for example, chronic heart/lung/liver/kidney disease, immunocompromised)    PERSONAL  PREVENTION PLAN   Your Personal Prevention Plan is based on your overall health and your responses to the health questionnaire you completed. The following information is for you to review in addition to the recommendations, referrals, and tests we have discussed at your visit.     Physical Activity:   Physical activity can help you maintain a healthy weight, prevent or control illness, reduce stress, and sleep better. It can also help you improve your balance to avoid falls. Try to build up to and maintain a total of 30 minutes of activity each day. If you are able, try walking, doing yard or housework, and taking the stairs more often. You can also strengthen your muscles with exercises done while sitting or lying down. Web resource - https://www.carpenter-henry.info/  Emotional Health:   Feeling "down in the dumps" or anxious every now and then is a natural part of life. If this feeling lasts for a few weeks or more, talk with me as soon as possible. It could be a sign of a problem that needs treatment. There are many types of treatment available. Web resource -  rxpreview.de  Falls:   You can reduce  your risk of falling by making changes in your home. Remove items that may cause tripping, improve lighting, and consider installing grab bars.   Talk with me if you have problems with balance and walking. To prevent falls, you may need your vision, hearing, or blood pressure checked. Exercises to improve your strength and balance, or using a cane or walker, may help. Review your medicines with me at every visit, because some can affect balance. Please be sure to let me know if you fall or are fearful you may fall. Web resource - bouncethru.fi  Urinary Leakage:   Urine leakage is common, but it is not a normal part of aging. Talk with me about any urine leakage so that the cause can be found and treated. Treatment can include bladder training, exercises,  medicine or surgery.   Pain:   We all have aches and pains at times, but chronic pain can change how you feel and live every day. Please talk with me about any symptoms of chronic pain so that we can determine how best to treat.   Sleep:   Getting a good night's sleep is vital to your health and well-being and can help prevent or manage health problems. Often, sleep can be improved by changing behaviors, including when you go to bed and what you do before bed. Sleep apnea can cause problems such as struggling to stay awake during the day. Please let me know if you would like to learn more about improving your sleep and/or think you may have sleep apnea. Web resource -  thousandquestions.com.cy  Seat Belt:   Please remember to wear a seat belt when driving or riding in a vehicle. It is one of the most important things you can do to stay safe in a car.   Nutrition:   Remember to eat plenty of fruits, vegetables, whole grains, and dairy. Drink at least 64 ounces (8 full glasses) of water a day, unless you have been advised to limit fluids.  Web resource- pickseat.dk  Alcohol:   Alcohol can have a greater effect on older people, who may feel its effects at a lower amount. Older people should limit alcoholic drinks (no more than one a day for women and no more than two a day for men). Please let me know if alcohol use becomes a problem.  Web resource - agingmortgage.ca  Tobacco:   Not smoking or using other forms of tobacco is one of the most important things you can do for your health. Here is some more information about the importance of quitting smoking and how to quit smoking - Mudlogger - broadjournal.com.pt  Advance Directives:   There may come a time when medical decisions need to be made on your behalf. Please talk with your family, and with me, about your wishes. It is important to provide information  about your decisions, and any formal advance directives, for your medical record. Here is additional information on advanced directives - Web resource - mediaexhibitions.no  Additional Support:   Sometimes it can be challenging to manage all aspects of daily life. Finding the right support can help you maintain or improve your health and independence. Please let me know if you would like to talk further about finding resources to assist you.

## 2024-01-19 NOTE — Assessment & Plan Note (Addendum)
 Lab Results   Component Value Date    CHOL 149 05/19/2022    CHOL 143 03/16/2021    TRIG 159 (H) 05/19/2022    TRIG 161 (H) 03/16/2021    HDL 30 (L) 05/19/2022    HDL 36 (L) 03/16/2021    LDL 87 05/19/2022    LDL 75 03/16/2021     Recommend patient consume a healthy diet that emphasizes the intake of vegetables, fruits, nuts, whole grains, lean vegetable or animal protein, and fish and minimizes the intake of trans fats, red meat and process meats, refined carbohydrates, and sugar sweetened beverages.  Recommend patient engage in at least 150 minutes per week of accumulated moderate-intensity physical activity or 75 minutes per week of vigorous-intensity physical activity.  Prior LDL values within acceptable limits on appropriate intensity statin medication.  Continue current medication therapy.  Obtain lipid indices today.  Continue to optimize a heart healthy diet and aerobic exercise efforts.      Orders:    Lipid Panel; Future    Comprehensive Metabolic Panel; Future

## 2024-01-19 NOTE — Assessment & Plan Note (Addendum)
 Quiescent, no interim flare  Continue allopurinol  Continue low purine diet.   Check uric acid    Orders:    Comprehensive Metabolic Panel; Future    Uric Acid; Future

## 2024-01-19 NOTE — Progress Notes (Signed)
 Have you seen any specialists/other providers since your last visit with us ?    No     The patient is due for:   Health Maintenance Due   Topic Date Due    Advance Directive on File  Never done    Shingrix  Vaccine 50+ (2) 05/13/2002    Medicare Annual Wellness Visit  10/29/2023

## 2024-01-23 ENCOUNTER — Ambulatory Visit (INDEPENDENT_AMBULATORY_CARE_PROVIDER_SITE_OTHER): Payer: Self-pay | Admitting: Family Medicine

## 2024-01-24 ENCOUNTER — Other Ambulatory Visit (INDEPENDENT_AMBULATORY_CARE_PROVIDER_SITE_OTHER): Payer: Self-pay | Admitting: Family Medicine

## 2024-01-24 ENCOUNTER — Encounter (INDEPENDENT_AMBULATORY_CARE_PROVIDER_SITE_OTHER): Payer: Self-pay | Admitting: Family Medicine

## 2024-01-24 ENCOUNTER — Telehealth (INDEPENDENT_AMBULATORY_CARE_PROVIDER_SITE_OTHER): Payer: Self-pay | Admitting: Family Medicine

## 2024-01-24 DIAGNOSIS — E039 Hypothyroidism, unspecified: Secondary | ICD-10-CM

## 2024-01-24 NOTE — Telephone Encounter (Signed)
 Called pt to discuss results per Dr. Beola and while on the phone call she states she had a reaction to the pneumonia vaccine. Pt states her arm was red and itchy approx 24-48 hours after receiving. Redness present at the site of the injection and down her arm to her elbow per the pt. Pt states her arm turns red after receiving the flu vaccine but itching has never been present before. Pt denies warmth at the site of the injection or down the arm, no fever, chills or difficulty breathing. Pt states the redness has went down and the itching is starting to subside. Pt advised on the use of antihistamines such as benadryl, Claritin, zyrtec, etc to assist with allergic reactions. Pt currently takes Claritin daily for allergies. Pt states she is not worried about the reaction as it has since improved. Advised pt if the redness or itching does not continue to improve and worsens then it is advised to contact the office. Pt verbalized understanding.

## 2024-01-25 NOTE — Progress Notes (Signed)
 Called pt and advised per Dr. [address]. Pt verbalized understanding. Pt advised to contact the office if she has difficulty taking the levothyroxine and Fosamax on Wednesday each week. Pt voiced understanding.

## 2024-02-06 ENCOUNTER — Ambulatory Visit (INDEPENDENT_AMBULATORY_CARE_PROVIDER_SITE_OTHER): Admitting: Family

## 2024-02-06 ENCOUNTER — Encounter (INDEPENDENT_AMBULATORY_CARE_PROVIDER_SITE_OTHER): Payer: Self-pay

## 2024-02-06 VITALS — BP 152/76 | HR 84 | Temp 97.5°F | Resp 16 | Ht 61.0 in | Wt 137.6 lb

## 2024-02-06 DIAGNOSIS — M62830 Muscle spasm of back: Secondary | ICD-10-CM

## 2024-02-06 MED ORDER — LIDOCAINE 5 % EX PTCH
1.0000 | MEDICATED_PATCH | CUTANEOUS | 0 refills | Status: AC
Start: 2024-02-06 — End: ?

## 2024-02-06 NOTE — Progress Notes (Signed)
 Patient: Leslie Ray   Date: 02/06/2024   MRN: [id number]           Subjective     Chief Complaint   Patient presents with    Back Pain     Pt C/O upper back muscle spasms onset  yesterday          Back Pain      Leslie Ray is a 89 y.o. female who presents today with complaint of upper back pain that started yesterday. She reports intermittent muscle spasm pain. Denies injury or trauma. She has been using Ibuprofen and heating pad, but had no significant symptoms relief.        History:  Pertinent Past Medical, Surgical, Family and Social History were reviewed.  Current Medications[1]  Allergies[2]  Medications and Allergies reviewed.         Objective   Vitals:    02/06/24 0912   BP: 152/76   BP Site: Left arm   Patient Position: Sitting   Cuff Size: Medium   Pulse: 84   Resp: 16   Temp: 97.5 F (36.4 C)   TempSrc: Tympanic   SpO2: 96%   Weight: 62.4 kg (137 lb 9.6 oz)   Height: 1.549 m (5\' 1" )     Body mass index is 26 kg/m.    Physical Exam  Vitals reviewed.   Constitutional:       General: She is not in acute distress.     Appearance: Normal appearance. She is not ill-appearing, toxic-appearing or diaphoretic.   HENT:      Head: Normocephalic and atraumatic.   Cardiovascular:      Rate and Rhythm: Normal rate.      Pulses: Normal pulses.   Pulmonary:      Effort: Pulmonary effort is normal.   Musculoskeletal:         General: Normal range of motion.      Comments: Mild discomfort over the upper back thoracic region paraspinal not involving the spine    Neurological:      Mental Status: She is alert.              UCC Course   There were no labs reviewed with this patient during the visit.  There were no x-rays reviewed with this patient during the visit.  Current Inpatient Medications with Last Dose Taken[3]       Procedures   Procedures      MDM/Assessment    Symptoms are consistent with back muscle spasm pain    Differential Diagnosis including but not limited to:  back strain, degenerative disc  disease  Encounter Diagnosis   Name Primary?    Back muscle spasm Yes  Plan  Patient has polypharmacy, taking multiple medication, avoid oral medication to avoid drug-drug interaction, provided with lidocaine  patch to be used 12-hour out of 24.  Advised to continue use heating pad  No orders of the defined types were placed in this encounter.    Requested Prescriptions     Signed Prescriptions Disp Refills    lidocaine  (LIDODERM ) 5 % 10 patch 0     Sig: Place 1 patch onto the skin in the morning. Remove & Discard patch within 12 hours or as directed by MD.       Discussed results and diagnosis with patient/family.  Reviewed warning signs for worsening condition, as well as, indications for follow-up with primary care physician and return to urgent care clinic.   Patient/family expressed understanding of instructions.     An After Visit Summary with pertinent information was made available to patient/family via MyChart or in-print.       [1]   Current Outpatient Medications:     acetaminophen (TYLENOL) 160 MG chewable tablet, Chew 1 tablet (160 mg) by mouth every 6 (six) hours as needed for Pain, Disp: , Rfl:     albuterol  sulfate HFA (PROVENTIL ) 108 (90 Base) MCG/ACT inhaler, INHALE TWO PUFFS BY MOUTH EVERY 4 HOURS AS NEEDED FOR SHORTNESS OF BREATH, Disp: 8.5 g, Rfl: 1    alendronate  (FOSAMAX ) 70 MG tablet, TAKE 1 TABLET BY MOUTH ONCE WEEKLY ON AN EMPTY STOMACH BEFORE BREAKFAST. REMAIN UPRIGHT FOR 30 MINUTES AND TAKE WITH 8 OUNCES OF WATER, Disp: 12 tablet, Rfl: 3    allopurinol  (ZYLOPRIM ) 100 MG tablet, TAKE 1 TABLET BY MOUTH DAILY, Disp: 100 tablet, Rfl: 3    amLODIPine  (NORVASC ) 10 MG tablet, TAKE ONE TABLET BY MOUTH DAILY, Disp: 100 tablet, Rfl: 3    atorvastatin  (LIPITOR) 40 MG tablet, TAKE 1 TABLET BY MOUTH DAILY, Disp: 100 tablet, Rfl: 3     azelastine  (ASTELIN ) 0.1 % nasal spray, 2 sprays by Nasal route 2 (two) times daily Use in each nostril as directed, Disp: 30 mL, Rfl: 5    budesonide -formoterol  (SYMBICORT ) 160-4.5 MCG/ACT inhaler, Inhale 2 puffs into the lungs 2 (two) times daily, Disp: 3 each, Rfl: 3    calcium -vitamin D  (OSCAL-500 + D) 500-200 MG-UNIT per tablet, Take 1 tablet by mouth 2 (two) times daily 600mg  (1500mg ) - 200U tab, Disp: , Rfl:     clobetasol (TEMOVATE) 0.05 % cream, , Disp: , Rfl:     estradiol (ESTRACE) 0.1 MG/GM vaginal cream, , Disp: , Rfl:     fluticasone  (FLONASE ) 50 MCG/ACT nasal spray, 2 sprays by Nasal route daily, Disp: 16 g, Rfl: 5    hydrocortisone  (ANUSOL -HC) 2.5 % rectal cream, Place rectally 3 (three) times daily, Disp: 30 g, Rfl: 0    hydrocortisone  (ANUSOL -HC) 25 MG suppository, Place 1 suppository (25 mg) rectally 2 (two) times daily, Disp: 30 suppository, Rfl: 0    Loratadine (CLARITIN PO), Take 5 mg by mouth daily  , Disp: , Rfl:     losartan  (COZAAR ) 100 MG tablet, TAKE ONE TABLET BY MOUTH DAILY, Disp: 100 tablet, Rfl: 3    meclizine (ANTIVERT) 25 MG tablet, Take 1 tablet (25 mg) by mouth every 8 (eight) hours as needed, Disp: , Rfl:     metoprolol  succinate (TOPROL -XL) 100 MG 24 hr tablet, TAKE 1 TABLET BY MOUTH DAILY, Disp: 100 tablet, Rfl: 3    montelukast  (SINGULAIR ) 10 MG tablet, TAKE ONE TABLET BY MOUTH EVERY EVENING, Disp: 90 tablet, Rfl: 3  Omega-3 Fatty Acids (OMEGA-3 FISH OIL) 500 MG Cap, Take 2 capsules (1,000 mg) by mouth 2 (two) times daily, Disp: , Rfl:     omeprazole (PriLOSEC) 40 MG capsule, TAKE ONE CAPSULE BY MOUTH DAILY BEFORE A MEAL, Disp: 90 capsule, Rfl: 3    polyethylene glycol (MIRALAX ) 17 GM/SCOOP powder, Take 17 g by mouth daily, Disp: 225 g, Rfl: 0    Synthroid  75 MCG tablet, TAKE 1 TABLET BY MOUTH DAILY, Disp: 30 tablet, Rfl: 0    vitamins/minerals Tab, Take 1 tablet by mouth daily, Disp: , Rfl:     lidocaine  (LIDODERM ) 5 %, Place 1 patch onto the skin in the morning. Remove &  Discard patch within 12 hours or as directed by MD., Disp: 10 patch, Rfl: 0  [2]   Allergies  Allergen Reactions    Penicillins    [3]   No current facility-administered medications for this visit.

## 2024-03-01 ENCOUNTER — Other Ambulatory Visit (INDEPENDENT_AMBULATORY_CARE_PROVIDER_SITE_OTHER): Payer: Self-pay | Admitting: Family Medicine

## 2024-03-01 NOTE — Telephone Encounter (Signed)
 Last filled March 2024.   Last o/v March 2025.  Patient has an upcoming appointment on May 23 2024.  Queued up 90 with 0 refills.

## 2024-03-21 ENCOUNTER — Encounter (INDEPENDENT_AMBULATORY_CARE_PROVIDER_SITE_OTHER): Payer: Self-pay | Admitting: Internal Medicine

## 2024-03-21 ENCOUNTER — Ambulatory Visit (INDEPENDENT_AMBULATORY_CARE_PROVIDER_SITE_OTHER): Payer: Medicare Other | Admitting: Internal Medicine

## 2024-03-21 VITALS — BP 144/80 | HR 67 | Temp 97.8°F | Resp 16 | Ht 61.2 in | Wt 140.0 lb

## 2024-03-21 DIAGNOSIS — J45909 Unspecified asthma, uncomplicated: Secondary | ICD-10-CM

## 2024-03-21 DIAGNOSIS — J42 Unspecified chronic bronchitis: Secondary | ICD-10-CM | POA: Insufficient documentation

## 2024-03-21 DIAGNOSIS — R0600 Dyspnea, unspecified: Secondary | ICD-10-CM

## 2024-03-21 MED ORDER — ALBUTEROL SULFATE HFA 108 (90 BASE) MCG/ACT IN AERS
2.0000 | INHALATION_SPRAY | Freq: Four times a day (QID) | RESPIRATORY_TRACT | 3 refills | Status: AC | PRN
Start: 2024-03-21 — End: ?

## 2024-03-21 MED ORDER — AZELASTINE-FLUTICASONE 137-50 MCG/ACT NA SUSP: 1.0000 | Freq: Two times a day (BID) | NASAL | 5 refills | Status: AC

## 2024-03-21 NOTE — Progress Notes (Signed)
 [facility] VISIT    Patient Name: Leslie Ray, Leslie Ray  Date of Visit:  03/21/2024  Date of Birth: 1935-06-04  AGE: 89 y.o.  Medical Record #: [id number]  Requesting Physician: Tawni DELENA First, DO       HISTORY OF PRESENT ILLNESS     Leslie Ray is a 89 y.o.  female  has a past medical history of allergies, anemia, arthritis, asthma, GERD, gout, hyperglycemia, HLD, HTN, murmur and vertigo seen today for follow up.     Initial Consult on 11/06/2018     The patient was recently admitted to [facility] on December 4th 2019 for chest pain and shortness of breath. Chest x-ray report revealed mild hyperinflation and increased interstitial markings but no consolidation or pleural effusions. Note, I cannot review these images directly. She was treated for a presumed COPD exacerbation given wheezing and with steroids and antibiotics. She was discharged on azithromycin , prednisone , and Singulair . She has improved steadily with this and has essentially returned to her baseline as of today's visit.     At baseline, she does report some dyspnea with exertion, though this is generally mild. She does not exercise per se, but does not have limitation from dyspnea. She has no prior history of asthma or COPD, but does report occasional wheezing over the years. Since starting Singulair  December, this has helped this. She has had 2 prior pneumonias in the past. She has a history of secondhand smoke, but has never been a smoker. She does have a history of allergies and chronic postnasal drainage. It is not clear which seasons are particularly worse for her, but her allergies are generally mild as well.     Interval History     03/17/2023  Since her last visit, the patient has been stable overall.  She is compliant with Symbicort  HFA. She notices increasing shortness of breath with associated wheezing when she does not use it. Her main complaint  is regarding her sinus drainage and chest congestion. She  has difficulty expectorating her secretions. She finds minimal benefits from Singulair , but not from Flonase  nasal spray. She states Flonase  causes her nasal cavity to have a burning sensation. She has not had any recent flare ups which led to requiring oral steroids or antibiotics. She denies fevers and chills.     09/21/2023  She reports she has had good and bad days since her last visit 6 months ago but overall is doing well.  She is living at a new residence with no pets and is not wheezing as a result.  Today she states she coughs some, that its not bothersome or productive of any increased volume of sputum.  She states that once and a while she will have some increased purulent sputum that responds well to short bursts of steroids prescribed by her PCP.  She takes Symbicort  160, 2 puffs twice a day. She uses nasal sprays sometimes, not often.     She endorses she is pleased with her asthma control and is able to manage symptoms with her current regime.     She has some LE edema on her left leg, otherwise denies any signs of acute symptoms of respiratory infection or process.     03/21/2024  She has been experiencing a cough aggravated by her allergies. Her cough started worsening over the past few weeks and it tends to be intermittent during the day. She denies any significant post nasal drop. She complains of occasional wheezing when she does  not use her inhalers. When she is wheezing, she tends to cough. She has not required any steroids or antibiotics since her last visit. She has not had any recent ER or urgent care visits for her asthma.    PHYSICAL EXAM:  Vitals:    03/21/24 0849   BP: 144/80   Pulse: 67   Resp: 16   Temp: 97.8 F (36.6 C)   SpO2: 100%        Physical Exam  Constitutional:       Appearance: She is normal weight.   HENT:      Nose: Congestion present.   Cardiovascular:      Rate and Rhythm: Normal rate and regular rhythm.      Pulses: Normal pulses.      Heart sounds: Normal heart  sounds.   Pulmonary:      Effort: Pulmonary effort is normal.      Breath sounds: Normal breath sounds.   Neurological:      General: No focal deficit present.      Mental Status: She is alert and oriented to person, place, and time. Mental status is at baseline.       Current Outpatient Medications   Medication Sig Dispense Refill    acetaminophen (TYLENOL) 160 MG chewable tablet Chew 1 tablet (160 mg) by mouth every 6 (six) hours as needed for Pain      albuterol  sulfate HFA (PROVENTIL ) 108 (90 Base) MCG/ACT inhaler INHALE TWO PUFFS BY MOUTH EVERY 4 HOURS AS NEEDED FOR SHORTNESS OF BREATH 8.5 g 1    alendronate  (FOSAMAX ) 70 MG tablet TAKE 1 TABLET BY MOUTH ONCE WEEKLY ON AN EMPTY STOMACH BEFORE BREAKFAST. REMAIN UPRIGHT FOR 30 MINUTES AND TAKE WITH 8 OUNCES OF WATER 12 tablet 3    allopurinol  (ZYLOPRIM ) 100 MG tablet TAKE 1 TABLET BY MOUTH DAILY 100 tablet 3    amLODIPine  (NORVASC ) 10 MG tablet TAKE 1 TABLET BY MOUTH DAILY 100 tablet 3    atorvastatin  (LIPITOR) 40 MG tablet TAKE 1 TABLET BY MOUTH DAILY 100 tablet 3    azelastine  (ASTELIN ) 0.1 % nasal spray 2 sprays by Nasal route 2 (two) times daily Use in each nostril as directed 30 mL 5    budesonide -formoterol  (SYMBICORT ) 160-4.5 MCG/ACT inhaler Inhale 2 puffs into the lungs 2 (two) times daily 3 each 3    calcium -vitamin D  (OSCAL-500 + D) 500-200 MG-UNIT per tablet Take 1 tablet by mouth 2 (two) times daily 600mg  (1500mg ) - 200U tab      clobetasol (TEMOVATE) 0.05 % cream       estradiol (ESTRACE) 0.1 MG/GM vaginal cream       fluticasone  (FLONASE ) 50 MCG/ACT nasal spray 2 sprays by Nasal route daily 16 g 5    hydrocortisone  (ANUSOL -HC) 2.5 % rectal cream Place rectally 3 (three) times daily 30 g 0    hydrocortisone  (ANUSOL -HC) 25 MG suppository Place 1 suppository (25 mg) rectally 2 (two) times daily 30 suppository 0    lidocaine  (LIDODERM ) 5 % Place 1 patch onto the skin in the morning. Remove & Discard patch within 12 hours or as directed by MD. 10 patch 0     Loratadine (CLARITIN PO) Take 5 mg by mouth once daily      losartan  (COZAAR ) 100 MG tablet TAKE ONE TABLET BY MOUTH DAILY 100 tablet 3    meclizine (ANTIVERT) 25 MG tablet Take 1 tablet (25 mg) by mouth every 8 (eight) hours as needed  metoprolol  succinate (TOPROL -XL) 100 MG 24 hr tablet TAKE 1 TABLET BY MOUTH DAILY 100 tablet 3    montelukast  (SINGULAIR ) 10 MG tablet TAKE ONE TABLET BY MOUTH EVERY EVENING 90 tablet 3    Omega-3 Fatty Acids (OMEGA-3 FISH OIL) 500 MG Cap Take 2 capsules (1,000 mg) by mouth 2 (two) times daily      omeprazole (PriLOSEC) 40 MG capsule TAKE ONE CAPSULE BY MOUTH DAILY BEFORE A MEAL 90 capsule 3    polyethylene glycol (MIRALAX ) 17 GM/SCOOP powder Take 17 g by mouth daily 225 g 0    Synthroid  75 MCG tablet TAKE 1 TABLET BY MOUTH DAILY 30 tablet 0    vitamins/minerals Tab Take 1 tablet by mouth once daily       No current facility-administered medications for this visit.        Spiro/NIOX/6MWT/PFT Data:  Spirometry on 03/21/2024: FVC: 1.82 (86%), FEV1: 1.34 (83%), FEV1/FVC: 74% - Normal spirometry    Spirometry on 09/21/2023: FVC: 1.77 (82%), FEV1: 1.31 (81%), FEV1/FVC: 74% - Normal    Spirometry on 08/24/2021: FVC: 1.93 (92%), FEV1: 1.45 (91%), FEV1/FVC: 75%--Normal     Spirometry on 06/04/2021: FVC 1.98 (94%), FEV1 1.55 (97%), FEV1/FVC 79%-- Normal     Spirometry on 04/23/2021: FEV1 = 1.45 (91%), FVC = 1.84 (88%), FEV1/FVC 78% - normal     Laboratory and Chest Images:  Lab Results   Component Value Date    WBC 10.77 (H) 05/19/2022    HGB 11.8 05/19/2022    HCT 35.0 05/19/2022    PLT 358 (H) 05/19/2022    ABSEOSAUTOMA 0.72 (H) 05/19/2022      Lab Results   Component Value Date    GLU 94 01/19/2024    ALKPHOS 67 01/19/2024    AST 29 01/19/2024    ALT 22 01/19/2024    CREAT 1.0 01/19/2024    CO2 22 01/19/2024     In-Clinic CXR on 03/18/2023: personally reviewed and demonstrated, no acute cardiopulmonary process     CXR on 03/22/2019, personally reviewed and demonstrated, Mild  cardiomegaly. Emphysematous changes. Left basilar atelectasis or scarring.         ASSESSMENT DETAILS:  Kaziyah Parkison  is a 89 y.o.  female seen today for the following:    Mild persistent asthma   Stable overall - clearly benefits from Symbicort   No recent asthma exacerbations    Chronic allergic rhinitis  On Singulair  and Claritin daily but continues to have a cough.  She will benefit from starting Dymista NS to get her symptoms under better control.    Gastroesophageal reflux disease    Stage 3a chronic kidney disease    Essential hypertension    Hyperlipidemia      PLAN DETAILS:  Start generic Dymista NS 2 sprays per nostril BID.  Continue Symbicort  160 mcg 2 puffs BID--Pt is aware to gargle with water, spit, then brush teeth after each use to prevent thrush  Continue ProAir  Digihaler 2 puffs every 4-6 hours prn for shortness of breath, chest tightness, or wheezing   Continue Singulair  10 mg QHS   Continue Claritin QD    No follow-ups on file. Reviewed warning signs for worsening condition and indications and instructions for scheduling a sooner, urgent follow-up appointment.      Moderate complexity medical decision making.      I personally scribed for Leslie JINNY Lang, MD  on 03/21/2024. The documentation recorded by the scribe, Bolling DELENA Forge accurately reflects the service I personally  performed and the decisions made by me, Leslie JINNY Lang, MD.    SIGNED:   Oneil JINNY Lang, MD  [facility]

## 2024-03-23 LAB — SPIROMETRY
-: NORMAL
FEF 25-75% (Pre-Bronch) %Pred: 73 %
FEF 25-75% (Pre-Bronch) Actual: 0.94 L/s
FEF 25-75% (Pre-Bronch) Pred: 1.29 L/s
FEF Max (Pre-Bronch) %Pred: 90 %
FEF Max (Pre-Bronch) Actual: 3.85 L/s
FEF Max (Pre-Bronch) Pred: 4.27 L/s
FEF2575 (Lower Limit of Normal): 0.5 L/s
FEF2575 (Standard Deviation): 0.61 L/s
FEFMax (Lower Limit of Normal): 2.41 L/s
FEFMax (Standard Deviation): 1.13 L/s
FEV1 (Lower Limit of Normal): 1.09 L
FEV1 (Pre-Bronch) %Pred: 83 %
FEV1 (Pre-Bronch) Actual: 1.34 L
FEV1 (Pre-Bronch) Pred: 1.6 L
FEV1 (Standard Deviation): 0.3 L
FEV1/FVC (Pre-Bronch) %Pred: 95 %
FEV1/FVC (Pre-Bronch) Actual: 74 %
FEV1/FVC (Pre-Bronch) Pred: 77 %
FVC (Lower Limit of Normal): 1.44 L
FVC (Pre-Bronch) %Pred: 86 %
FVC (Pre-Bronch) Actual: 1.82 L
FVC (Pre-Bronch) Pred: 2.12 L
FVC (Standard Deviation): 0.42 L
PEF (Pre-Actual): 231.2 L/min

## 2024-03-26 ENCOUNTER — Other Ambulatory Visit (FREE_STANDING_LABORATORY_FACILITY)

## 2024-03-26 DIAGNOSIS — E039 Hypothyroidism, unspecified: Secondary | ICD-10-CM

## 2024-03-26 LAB — THYROID STIMULATING HORMONE (TSH) WITH REFLEX TO FREE T4: TSH: 5.14 u[IU]/mL — ABNORMAL HIGH (ref 0.35–4.94)

## 2024-03-26 LAB — T4, FREE: T4 Free: 1.16 ng/dL (ref 0.69–1.48)

## 2024-03-27 ENCOUNTER — Ambulatory Visit (INDEPENDENT_AMBULATORY_CARE_PROVIDER_SITE_OTHER): Payer: Self-pay | Admitting: Internal Medicine

## 2024-03-27 ENCOUNTER — Encounter (INDEPENDENT_AMBULATORY_CARE_PROVIDER_SITE_OTHER): Payer: Self-pay | Admitting: Internal Medicine

## 2024-03-27 NOTE — Progress Notes (Signed)
 Thyroid  tests show level is better than last time, in normal/stable range.     Rec:  1. Continue medications  2. Keep follow up with PCP

## 2024-04-28 ENCOUNTER — Other Ambulatory Visit (INDEPENDENT_AMBULATORY_CARE_PROVIDER_SITE_OTHER): Payer: Self-pay | Admitting: Family Medicine

## 2024-04-28 ENCOUNTER — Other Ambulatory Visit (INDEPENDENT_AMBULATORY_CARE_PROVIDER_SITE_OTHER): Payer: Self-pay | Admitting: Internal Medicine

## 2024-04-28 DIAGNOSIS — J4530 Mild persistent asthma, uncomplicated: Secondary | ICD-10-CM

## 2024-04-30 NOTE — Telephone Encounter (Signed)
 Last filled April 2024.   Last o/v March 2025.  Patient does not have an upcoming appointment and notified appointment needed.  Queued up 90 with 0 refills.

## 2024-05-23 ENCOUNTER — Ambulatory Visit (INDEPENDENT_AMBULATORY_CARE_PROVIDER_SITE_OTHER): Admitting: Family Medicine

## 2024-06-01 ENCOUNTER — Other Ambulatory Visit (INDEPENDENT_AMBULATORY_CARE_PROVIDER_SITE_OTHER): Payer: Self-pay | Admitting: Family Medicine

## 2024-06-01 DIAGNOSIS — I10 Essential (primary) hypertension: Secondary | ICD-10-CM

## 2024-06-01 NOTE — Telephone Encounter (Signed)
 Last filled June 2024.   Last o/v March 2025.  Patient has an upcoming appointment on August 01 2024.  Queued up 90 with 0 refills.

## 2024-07-24 ENCOUNTER — Other Ambulatory Visit (INDEPENDENT_AMBULATORY_CARE_PROVIDER_SITE_OTHER): Payer: Self-pay | Admitting: Internal Medicine

## 2024-07-24 ENCOUNTER — Other Ambulatory Visit (INDEPENDENT_AMBULATORY_CARE_PROVIDER_SITE_OTHER): Payer: Self-pay | Admitting: Family Medicine

## 2024-07-24 DIAGNOSIS — J4530 Mild persistent asthma, uncomplicated: Secondary | ICD-10-CM

## 2024-07-24 NOTE — Telephone Encounter (Signed)
 Last filled March 2025.   Last o/v March 2025.  Patient has an upcoming appointment on August 01 2024.  Queued up 30 with 0 refills.

## 2024-07-26 NOTE — Telephone Encounter (Signed)
 Refill request for montelukast  10 mg approved for 90 day supply per protocol. Patient's last visit 03/21/24 with upcoming appt on 08/27/24.

## 2024-07-27 ENCOUNTER — Other Ambulatory Visit (INDEPENDENT_AMBULATORY_CARE_PROVIDER_SITE_OTHER): Payer: Self-pay | Admitting: Family Medicine

## 2024-08-01 ENCOUNTER — Encounter (INDEPENDENT_AMBULATORY_CARE_PROVIDER_SITE_OTHER): Payer: Self-pay | Admitting: Family Medicine

## 2024-08-01 ENCOUNTER — Ambulatory Visit (INDEPENDENT_AMBULATORY_CARE_PROVIDER_SITE_OTHER): Admitting: Family Medicine

## 2024-08-01 VITALS — BP 134/71 | HR 70 | Temp 97.6°F | Wt 134.0 lb

## 2024-08-01 DIAGNOSIS — I10 Essential (primary) hypertension: Secondary | ICD-10-CM

## 2024-08-01 DIAGNOSIS — E785 Hyperlipidemia, unspecified: Secondary | ICD-10-CM

## 2024-08-01 DIAGNOSIS — M810 Age-related osteoporosis without current pathological fracture: Secondary | ICD-10-CM

## 2024-08-01 DIAGNOSIS — E039 Hypothyroidism, unspecified: Secondary | ICD-10-CM

## 2024-08-01 DIAGNOSIS — M109 Gout, unspecified: Secondary | ICD-10-CM

## 2024-08-01 DIAGNOSIS — N1831 Chronic kidney disease, stage 3a: Secondary | ICD-10-CM

## 2024-08-01 MED ORDER — ALENDRONATE SODIUM 70 MG PO TABS
ORAL_TABLET | ORAL | 3 refills | Status: AC
Start: 2024-08-01 — End: ?

## 2024-08-01 NOTE — Progress Notes (Signed)
 Have you seen any specialists/other providers since your last visit with us ?    No      The patient was informed that the following HM items are still outstanding:   Health Maintenance Due   Topic Date Due    Advance Directive on File  Never done    CV Echocardiogram  02/04/2024

## 2024-08-01 NOTE — Progress Notes (Addendum)
 [facility] [place]           Subjective     Chief Complaint   Patient presents with    Follow-up     Medication Check Visit    Gastroesophageal Reflux    Hyperlipidemia    Hypertension    Hypothyroidism     History of Present Illness  Leslie Ray is an 89 year old female who presents with fatigue and sleep disturbances.    She frequently feels tired, which she attributes to spending nearly ten months with her husband's sister after the passing of her sister's husband. She describes this period as 'depressing' and is relieved to be back home. She feels easily upset recently, which she associates with her fatigue and sleep problems.    She experiences sleep disturbances, often sleeping only two to three hours before waking up. It is sometimes difficult for her to return to sleep, and she may only manage to sleep a little more in the morning. Her sleep quality varies depending on the day's events.    She has difficulty accepting her reduced physical capabilities compared to the past. She has been walking to her sister-in-law's mailbox daily, which was a long distance, and found this to be tiring, but denies having shortness of breath or chest pressure/pain. No current exercise routine beyond her previous walks to the mailbox.    She is currently taking Fosamax  and requires a refill. She also takes medication for her thyroid . She tries to manage the timing of her medications, as both need to be taken on an empty stomach. No recent issues with gout. Her blood pressure is usually normal, though occasionally it has been elevated.    Problem   Osteoporosis    05/13/2020 DEXA:  IMPRESSION:    Osteoporosis of the left femoral neck. Osteopenia of the  total left hip and lumbar spine.    03/04/2023  DEXA- Osteopenia    Med:  Alendronate  70 mg weekly (rx'd 05/29/2020)  05/19/2022- pt reports sometimesshe skips alendronate  because of difficulty coordinating taking with levothyroxine .       Renal Function  Trend:  Lab Results   Component Value Date    CREAT 1.0 01/19/2024    CREAT 0.9 10/28/2022    CREAT 1.0 05/19/2022    CREAT 1.0 03/16/2021    CREAT 1.0 05/28/2020       Lab Results   Component Value Date    EGFR 54.2 (L) 01/19/2024    EGFR >60.0 10/28/2022    EGFR 55.0 (A) 05/19/2022    EGFR 52.8 03/16/2021    EGFR 52.9 05/28/2020          Hypothyroid    Med:  Levothyroxine  (Synthroid ) brand only 75 mcg daily (since 1989 when had partial thyroidectomy)  Pt takes synthroid  in the AM and waits at least 3-4 hours before she eats breakfast or takes any other vitamins/medications    Compliant with current regimen.  Denies side effects to therapy.  Denies any change in hair/nails/skin, constipation, unexplained wt changes.      Lab Results   Component Value Date    TSH 5.14 (H) 03/26/2024    TSH 16.62 (H) 01/19/2024    TSH 2.19 05/19/2022    TSH 3.15 03/16/2021    TSH 3.86 11/09/2019    TSH 5.04 (H) 08/08/2019          Hyperlipidemia     On atorvastatin  40 mg daily      Pt has hyperlipidemia  and is compliant with anti-lipidemic therapy.  Pt denies side effects of anti-lipidemic therapy - specifically denies myalgia.        PA: pt does light housekeeping twice a week.  Also walks, especially when weather is nicer    Lab Results   Component Value Date    CHOL 198 01/19/2024    CHOL 149 05/19/2022    CHOL 143 03/16/2021     Lab Results   Component Value Date    HDL 41 01/19/2024    HDL 30 (L) 05/19/2022    HDL 36 (L) 03/16/2021     Lab Results   Component Value Date    LDL 121 (H) 01/19/2024    LDL 87 05/19/2022    LDL 75 03/16/2021     Lab Results   Component Value Date    TRIG 179 (H) 01/19/2024    TRIG 159 (H) 05/19/2022    TRIG 161 (H) 03/16/2021          Essential Hypertension    Med:  Amlodipine  10 mg   Toprol  Xl 50 mg daily - increase to 100 mg 11/14/2019  Losartan  100 mg daily    02/04/2023:  NM MYOCARDIAL PERFUSION SPECT W STRESS AND REST   Summary    1. No significant ischemia nor infarct seen.    2. Normal wall  motion and systolic function, calculated EF greater than 70%.    3. Worsening of baseline inferolateral ST changes suggestive of ischemia.    4. Isolated PVCs.    5. No prior studies are available for comparison.     01/11/2023: ECHO ADULT TTE COMPLETE   Summary    * The left ventricle is normal in size.    * Left ventricular ejection fraction is normal with an estimated ejection  fraction of 60-65%.    * The right ventricular cavity size is normal in size.    * Normal right ventricular systolic function.    * There is moderate thickening and calcification of the aortic leaflets.    * There is mild to moderate mitral regurgitation.    * There is mild tricuspid regurgitation.    * No pulmonary hypertension with estimated right ventricular systolic  pressure of  31 mmHg.    * There is evidence of aortic plaque in the aortic root.    Occasional shortness of breath when carrying heavy items.   Pt has long history of dizziness, however not recently  At end of day has very slight swelling in legs.       Pt is compliant with current medication. No adverse effects reported to medication and compliance is reported to be good.     11/11/2022 - Pt reports she changed the times she takes her BP medication and does not have the swimmy headache that she had before.    Pt reports occasional shortness of breath with daily activities ongoing for years, no notable change.   Pt denies CP/chest discomfort, palpitations, dizziness (resolved), syncope or near syncope, PND or edema.  Pt prefers not to lay flat for years now. She states she feels nasal drainage and short of breath.  She uses an adjustable bed and keeps her head propped up.  This has not changed for years.     Home BP:    Patient reports that her BP is running 120-130/70s at home    Renal Function Trend:  Lab Results   Component Value Date    CREAT 1.0 01/19/2024  CREAT 0.9 10/28/2022    CREAT 1.0 05/19/2022    CREAT 1.0 03/16/2021    CREAT 1.0 05/28/2020       Lab  Results   Component Value Date    EGFR 54.2 (L) 01/19/2024    EGFR >60.0 10/28/2022    EGFR 55.0 (A) 05/19/2022    EGFR 52.8 03/16/2021    EGFR 52.9 05/28/2020       Lab Results   Component Value Date    WBC 10.77 (H) 05/19/2022    HGB 11.8 05/19/2022    HCT 35.0 05/19/2022    MCV 91.9 05/19/2022    PLT 358 (H) 05/19/2022          Gout       Flare has occurred on both feet. Has been ~ 5 years ago      On allopurinol  100 mg daily    No interim flare    Lab Results   Component Value Date    URICACID 5.9 01/19/2024    URICACID 5.4 05/19/2022    URICACID 5.2 03/16/2021    URICACID 5.9 03/14/2020    URICACID 7.3 (H) 11/09/2019              Review of Systems    Objective   BP 134/71 (BP Site: Right arm, Patient Position: Sitting, Cuff Size: Medium)   Pulse 70   Temp 97.6 F (36.4 C) (Temporal)   Wt 60.8 kg (134 lb)   BMI 25.15 kg/m   Wt Readings from Last 3 Encounters:   08/01/24 60.8 kg (134 lb)   03/21/24 63.5 kg (140 lb)   02/06/24 62.4 kg (137 lb 9.6 oz)       Physical Exam  Physical Exam      Results        Assessment/Plan     Assessment & Plan  Fatigue and Insomnia  Chronic fatigue and insomnia likely related to stress and caregiving.   - Advised on sleep hygiene.  - Ordered blood work to check thyroid  levels.    Hypothyroidism  Hypothyroidism with fatigue symptoms.   - Ordered blood work to check thyroid  levels.  - Coordinate with pharmacist to review medication timing.    Osteoporosis  Osteoporosis managed with Fosamax .  - Refilled Fosamax  prescription.  - Coordinated with pharmacist to review medication timing.  - DEXA planned for 02/2025  - Continue calcium  and vitamin D   -Encouraged regular weight bearing    Hypertension  Hypertension well-controlled with occasional elevated readings.    General Health Maintenance  Engages in physical activity. Routine wellness checkup planned.  - Scheduled wellness checkup in April.    Chennel was seen today for follow-up, gastroesophageal reflux, hyperlipidemia,  hypertension and hypothyroidism.    Diagnoses and all orders for this visit:    Essential hypertension    Hyperlipidemia, unspecified hyperlipidemia type  -     Lipid Panel; Future  -     Comprehensive Metabolic Panel; Future  -     Lipid Panel  -     Comprehensive Metabolic Panel    Hypothyroidism, unspecified type  -     Thyroid  Stimulating Hormone (TSH) with Reflex to Free T4; Future  -     AMB Referral to Pharmacy Services  -     Thyroid  Stimulating Hormone (TSH) with Reflex to Free T4    Osteoporosis, unspecified osteoporosis type, unspecified pathological fracture presence  -     alendronate  (FOSAMAX ) 70 MG tablet; Take 1 tab by mouth  every 7 days with a full glass of water on an empty stomach. Do not take anything else by mouth or lie down for 30 mins.  -     AMB Referral to Pharmacy Services  -     Dxa Bone Density Axial Skeleton; Future      Gout of foot, unspecified cause, unspecified chronicity, unspecified laterality  -     Uric Acid; Future  -     Uric Acid    Other orders  -     Follow Up In Primary Care; Future        Verbal consent obtained to record this visit.

## 2024-08-02 LAB — LIPID PANEL
Cholesterol / HDL Ratio: 4.9 {index}
Cholesterol: 161 mg/dL (ref <=199)
HDL: 33 mg/dL — ABNORMAL LOW (ref >=40)
LDL Calculated: 97 mg/dL (ref 0–99)
Triglycerides: 175 mg/dL — ABNORMAL HIGH (ref 34–149)
VLDL Calculated: 28 mg/dL (ref 10–40)

## 2024-08-02 LAB — COMPREHENSIVE METABOLIC PANEL
ALT: 21 U/L (ref <=55)
AST (SGOT): 23 U/L (ref <=41)
Albumin/Globulin Ratio: 1.3 (ref 0.9–2.2)
Albumin: 4.3 g/dL (ref 3.5–4.9)
Alkaline Phosphatase: 66 U/L (ref 37–117)
Anion Gap: 9 (ref 5.0–15.0)
BUN: 20 mg/dL (ref 7–21)
Bilirubin, Total: 0.5 mg/dL (ref 0.2–1.2)
CO2: 25 meq/L (ref 17–29)
Calcium: 9.6 mg/dL (ref 7.9–10.2)
Chloride: 106 meq/L (ref 99–111)
Creatinine: 1 mg/dL (ref 0.4–1.0)
GFR: 53.9 mL/min/1.73 m2 — ABNORMAL LOW (ref >=60.0)
Globulin: 3.2 g/dL (ref 2.0–3.6)
Glucose: 93 mg/dL (ref 70–100)
Hemolysis Index: 1 {index}
Potassium: 4.3 meq/L (ref 3.5–5.3)
Protein, Total: 7.5 g/dL (ref 6.0–8.3)
Sodium: 140 meq/L (ref 135–145)

## 2024-08-02 LAB — THYROID STIMULATING HORMONE (TSH) WITH REFLEX TO FREE T4: TSH: 7.6 u[IU]/mL — ABNORMAL HIGH (ref 0.35–4.94)

## 2024-08-02 LAB — T4, FREE: T4 Free: 1.11 ng/dL (ref 0.69–1.48)

## 2024-08-02 LAB — URIC ACID: Uric Acid: 5.8 mg/dL (ref 2.6–7.1)

## 2024-08-03 ENCOUNTER — Telehealth: Payer: Self-pay

## 2024-08-03 NOTE — Telephone Encounter (Signed)
 Ambulatory Clinical Pharmacy Consult    Referring Provider/High-Risk Nurse Care Manager [facility]):  PCP    PCP:  Beola Tawni LABOR, DO    Primary Pharmacy Concern:  patient having difficulty with timing her medications    Chart Reviewed for Pertinent information:  Seen by PCP on 08/01/24 and refilled alendronate  70mg  once weekly which requires to be taken on an empty stomach.  Also on synthroid  75mcg daily which also is required to be taken on an empty stomach.  Other active meds include Symbicort  and albuterol  for asthma, azelastine -fluticasone  for allergies    Patient Outreach: Patient reports she has trouble coordinating alendronate  and synthroid  on an empty stomach.  States that she will wake up at 3am to take one medication, then the other medication when she wakes up for the day.  Writer advised that it would be reasonable to take the synthroid  first thing in the morning upon waking, then when she's up and out of bed to take the alendronate  if it's at least 30 mins after and to ensure it's taken with a full glass of water and to remain upright.  Calcium  and iron supplements should be taken later in the day separated by 3-4 hours from those medications.  Patient expressed understanding and agrees to plan.     Alpha Camp, PharmD  Pharmacy Clinical Specialist  [facility], Ambulatory Care Management Team  O [phone]

## 2024-08-05 ENCOUNTER — Ambulatory Visit (INDEPENDENT_AMBULATORY_CARE_PROVIDER_SITE_OTHER): Payer: Self-pay | Admitting: Family Medicine

## 2024-08-07 ENCOUNTER — Other Ambulatory Visit (INDEPENDENT_AMBULATORY_CARE_PROVIDER_SITE_OTHER): Payer: Self-pay | Admitting: Internal Medicine

## 2024-08-07 ENCOUNTER — Other Ambulatory Visit (INDEPENDENT_AMBULATORY_CARE_PROVIDER_SITE_OTHER): Payer: Self-pay | Admitting: Family Medicine

## 2024-08-07 DIAGNOSIS — M109 Gout, unspecified: Secondary | ICD-10-CM

## 2024-08-21 ENCOUNTER — Telehealth (INDEPENDENT_AMBULATORY_CARE_PROVIDER_SITE_OTHER): Payer: Self-pay | Admitting: Family Medicine

## 2024-08-21 DIAGNOSIS — I10 Essential (primary) hypertension: Secondary | ICD-10-CM

## 2024-08-21 MED ORDER — LOSARTAN POTASSIUM 100 MG PO TABS
100.0000 mg | ORAL_TABLET | Freq: Every day | ORAL | 3 refills | Status: DC
Start: 2024-08-21 — End: 2024-08-24

## 2024-08-21 NOTE — Telephone Encounter (Signed)
 Pharmacy called and said patient needs refill on the following medication. Patient is out and they gave her a few pills to hold her over. Please advise, thank you.    losartan  (COZAAR ) 100 MG tablet

## 2024-08-21 NOTE — Addendum Note (Signed)
 Addended by: Lakelynn Severtson M. on: 08/21/2024 10:37 AM     Modules accepted: Orders

## 2024-08-21 NOTE — Telephone Encounter (Signed)
 Medication for Losartan  100 mg was prescribed last on 04/30/24 for a 90 day supply. Patient was last seen on 08/01/24 and is scheduled for a follow up on 01/30/25.

## 2024-08-22 NOTE — Progress Notes (Signed)
 [name] [facility] OFFICE VISIT    HISTORY OF PRESENT ILLNESS:  I had the pleasure of seeing Leslie Ray today for cardiovascular follow-up.  Primary cardiologist Dr. Claudene.  Last OV 05/26/2023.    Leslie Ray is a 89 y.o. female with a past medical history significant for hypertension.    Patient had exertional dyspnea, nuclear stress test and echocardiogram was ordered.  Nuclear stress test obtained on 02/04/2023 was negative for ischemia or infarct.  Echocardiogram on 01/12/2023 showed preserved EF of 60 to 65%, moderate thickening and calcification of aortic leaflets, mild to moderate MR, mild TR, evidence of aortic plaque in the aortic root.  Her exertional dyspnea was presumed to be noncardiac, likely pulmonary etiology.    At today's visit Leslie Ray is doing well.  She denies chest pain, shortness of breath, edema, palpitations, presyncope, or syncope. Patient does not check her BP at home but endorses occasional dizziness which she has been experiencing for many years and she treats it with Meclizine, last dose about 2 months ago. She has an follow up with her pulmonologist next week. Has stairs in the her house which she uses dozen times without any exertional SOB or CP. Does not regularly exercise.       Cardiographics:  EKG: NSR, HR 66bpms (08/24/2024)    Nuclear stress test 02/04/2023:  Summary    1. No significant ischemia nor infarct seen.    2. Normal wall motion and systolic function, calculated EF greater than 70%.    3. Worsening of baseline inferolateral ST changes suggestive of ischemia.    4. Isolated PVCs.    5. No prior studies are available for comparison.    TTE(01/12/2023):  Summary    * The left ventricle is normal in size.    * Left ventricular ejection fraction is normal with an estimated ejection  fraction of 60-65%.    * The right ventricular cavity size is normal in size.    * Normal right ventricular systolic function.    * There is moderate thickening and  calcification of the aortic leaflets.    * There is mild to moderate mitral regurgitation.    * There is mild tricuspid regurgitation.    * No pulmonary hypertension with estimated right ventricular systolic  pressure of  31 mmHg.    * There is evidence of aortic plaque in the aortic root.        PMH:   Patient Active Problem List    Diagnosis Date Noted    Chronic bronchitis, unspecified chronic bronchitis type (CMS/HCC) 03/21/2024    Mild persistent asthma without complication 03/18/2023    Post-nasal drip 03/18/2023    Osteoporosis 05/15/2020    Counseling on health promotion and disease prevention 03/14/2020    Stage 3a chronic kidney disease (CMS/HCC) 12/14/2019    Hypothyroid 05/14/2019    Irregular bowel habits 02/12/2019    Chronic allergic rhinitis 02/11/2019    Mild intermittent asthma 02/11/2019    GERD (gastroesophageal reflux disease) 10/11/2018    Hyperlipidemia 10/11/2018    Essential hypertension 10/11/2018    Gout         MEDICATIONS:     Current Medications[1]     SH: Social History[2]    REVIEW OF SYSTEMS: All other systems reviewed and negative except as above.    PHYSICAL EXAMINATION   General Appearance: A well-appearing female in no acute distress.   Vital Signs: BP 133/76 (BP Site: Left arm, Patient Position:  Sitting, Cuff Size: Medium)   Pulse 76   Ht 1.549 m (5' 1)   Wt 60.8 kg (134 lb)   SpO2 95%   BMI 25.32 kg/m    HEENT: Sclera anicteric, conjunctiva without pallor, moist mucous membranes, normal dentition.   Neck: Supple without jugular venous distention. Thyroid  nonpalpable. Normal carotid upstrokes without bruits.  Chest: Clear to auscultation bilaterally with good air movement and respiratory effort and no wheezes, rales, or rhonchi  Cardiovascular: Normal S1 and physiologically split S2 without murmurs, gallops or rub. PMI of normal size and nondisplaced.   Abdomen: Soft, nontender. No organomegaly.  No pulsatile masses or bruits.    Extremities: Warm without edema. All  peripheral pulses are full and equal.  Skin: No rash, xanthoma or xanthelasma.   Neuro: Alert and oriented x3. Grossly intact. Strength is symmetrical. Normal mood and affect.       Basic Metabolic Profile   Lab Results   Component Value Date    NA 140 08/01/2024    K 4.3 08/01/2024    BUN 20 08/01/2024    CREAT 1.0 08/01/2024    MG 1.8 05/19/2022    CA 9.6 08/01/2024    GLU 93 08/01/2024         Cardiac Biomarkers   No results found for: TROPONIN, BNP     CBC with Diff   Lab Results   Component Value Date    WBC 10.77 (H) 05/19/2022    HGB 11.8 05/19/2022    HCT 35.0 05/19/2022    PLT 358 (H) 05/19/2022         Cholesterol Panel   Lab Results   Component Value Date    CHOL 161 08/01/2024    HDL 33 (L) 08/01/2024    LDL 97 08/01/2024    TRIG 824 (H) 08/01/2024         Endocrine   Lab Results   Component Value Date    HGBA1C 5.6 05/19/2022    TSH 7.60 (H) 08/01/2024         Coagulation Studies   No results found for: INR, DDIMER        ASSESSMENT & PLAN:   1. Essential hypertension  ECG 12 lead (Normal)      #Hypertension  Currently controlled  - Continue Toprol -XL 100 mg daily, losartan  100 mg daily, amlodipine  10 mg daily    #Dyslipidemia  - Most recent lipid panel 08/01/2024: TC 161, TG 175, HDL 33, LDL 97  - Was advised by PCP to control triglycerides via diet and exercise      Return in about 1 year (around 08/24/2025).    ----------------------------  Arleta Dellen, CRNP  [facility]   Tel.: [phone], Fax: [phone]  [address], [place], [place] [address]  [address], [place], [place] [address]  [address], [place], [place] [address]  [address], [address], [place], [place] [address]  [address], [place], [place] [address]  _____________________________      Incident to service performed with physician present in the office. The physician's plan of care was implemented.            [1]   Current Outpatient  Medications   Medication Sig Dispense Refill    acetaminophen (TYLENOL) 160 MG chewable tablet Chew 1 tablet (160 mg) by mouth every 6 (six) hours as needed for Pain      albuterol  sulfate HFA (PROVENTIL )  108 (90 Base) MCG/ACT inhaler Inhale 2 puffs into the lungs every 6 (six) hours as needed for Wheezing 8.5 g 3    alendronate  (FOSAMAX ) 70 MG tablet Take 1 tab by mouth every 7 days with a full glass of water on an empty stomach. Do not take anything else by mouth or lie down for 30 mins. 12 tablet 3    allopurinol  (ZYLOPRIM ) 100 MG tablet TAKE 1 TABLET BY MOUTH DAILY 100 tablet 3    amLODIPine  (NORVASC ) 10 MG tablet TAKE 1 TABLET BY MOUTH DAILY 100 tablet 3    atorvastatin  (LIPITOR) 40 MG tablet TAKE 1 TABLET BY MOUTH DAILY 100 tablet 3    Azelastine -Fluticasone  137-50 MCG/ACT Suspension 1 spray by Nasal route 2 (two) times daily 23 g 5    budesonide -formoterol  (SYMBICORT ) 160-4.5 MCG/ACT inhaler Inhale 2 puffs into the lungs 2 (two) times daily 3 each 3    calcium -vitamin D  (OSCAL-500 + D) 500-200 MG-UNIT per tablet Take 1 tablet by mouth 2 (two) times daily 600mg  (1500mg ) - 200U tab      clobetasol (TEMOVATE) 0.05 % cream       estradiol (ESTRACE) 0.1 MG/GM vaginal cream       hydrocortisone  (ANUSOL -HC) 2.5 % rectal cream Place rectally 3 (three) times daily 30 g 0    hydrocortisone  (ANUSOL -HC) 25 MG suppository Place 1 suppository (25 mg) rectally 2 (two) times daily 30 suppository 0    lidocaine  (LIDODERM ) 5 % Place 1 patch onto the skin in the morning. Remove & Discard patch within 12 hours or as directed by MD. 10 patch 0    Loratadine (CLARITIN PO) Take 5 mg by mouth once daily      losartan  (COZAAR ) 100 MG tablet Take 1 tablet (100 mg) by mouth once daily 100 tablet 3    meclizine (ANTIVERT) 25 MG tablet Take 1 tablet (25 mg) by mouth every 8 (eight) hours as needed      metoprolol  succinate (TOPROL -XL) 100 MG 24 hr tablet TAKE 1 TABLET BY MOUTH DAILY 100 tablet 3    montelukast  (SINGULAIR ) 10 MG tablet  TAKE 1 TABLET BY MOUTH EVERY EVENING 90 tablet 0    Omega-3 Fatty Acids (OMEGA-3 FISH OIL) 500 MG Cap Take 2 capsules (1,000 mg) by mouth 2 (two) times daily      omeprazole (PriLOSEC) 40 MG capsule TAKE 1 CAPSULE BY MOUTH DAILY BEFORE A MEAL 90 capsule 3    polyethylene glycol (MIRALAX ) 17 GM/SCOOP powder Take 17 g by mouth daily 225 g 0    Synthroid  75 MCG tablet TAKE 1 TABLET BY MOUTH DAILY 100 tablet 2    vitamins/minerals Tab Take 1 tablet by mouth once daily       No current facility-administered medications for this visit.   [2]   Social History  Tobacco Use    Smoking status: Never     Passive exposure: Past    Smokeless tobacco: Never    Tobacco comments:     Never Smoked   Vaping Use    Vaping status: Never Used   Substance Use Topics    Alcohol use: Never    Drug use: Never

## 2024-08-24 ENCOUNTER — Ambulatory Visit (INDEPENDENT_AMBULATORY_CARE_PROVIDER_SITE_OTHER)

## 2024-08-24 ENCOUNTER — Encounter (INDEPENDENT_AMBULATORY_CARE_PROVIDER_SITE_OTHER): Payer: Self-pay

## 2024-08-24 VITALS — BP 133/76 | HR 76 | Ht 61.0 in | Wt 134.0 lb

## 2024-08-24 DIAGNOSIS — I10 Essential (primary) hypertension: Secondary | ICD-10-CM

## 2024-08-24 LAB — ECG 12-LEAD
Atrial Rate: 66 {beats}/min
IHS MUSE NARRATIVE AND IMPRESSION: NORMAL
P Axis: 71 degrees
P-R Interval: 170 ms
Q-T Interval: 374 ms
QRS Duration: 72 ms
QTC Calculation (Bezet): 392 ms
R Axis: 47 degrees
T Axis: 15 degrees
Ventricular Rate: 66 {beats}/min

## 2024-08-24 MED ORDER — LOSARTAN POTASSIUM 100 MG PO TABS
100.0000 mg | ORAL_TABLET | Freq: Every day | ORAL | 3 refills | Status: AC
Start: 2024-08-24 — End: ?

## 2024-08-24 MED ORDER — ATORVASTATIN CALCIUM 40 MG PO TABS
40.0000 mg | ORAL_TABLET | Freq: Every day | ORAL | 3 refills | Status: AC
Start: 2024-08-24 — End: ?

## 2024-08-24 MED ORDER — AMLODIPINE BESYLATE 10 MG PO TABS
10.0000 mg | ORAL_TABLET | Freq: Every day | ORAL | 3 refills | Status: AC
Start: 2024-08-24 — End: ?

## 2024-08-24 MED ORDER — METOPROLOL SUCCINATE ER 100 MG PO TB24
100.0000 mg | ORAL_TABLET | Freq: Every day | ORAL | 3 refills | Status: AC
Start: 2024-08-24 — End: ?

## 2024-08-27 ENCOUNTER — Ambulatory Visit (INDEPENDENT_AMBULATORY_CARE_PROVIDER_SITE_OTHER): Admitting: Internal Medicine

## 2024-08-27 ENCOUNTER — Encounter (INDEPENDENT_AMBULATORY_CARE_PROVIDER_SITE_OTHER): Payer: Self-pay | Admitting: Internal Medicine

## 2024-08-27 VITALS — BP 122/70 | HR 73 | Temp 97.8°F | Resp 14 | Ht 61.0 in | Wt 134.4 lb

## 2024-08-27 DIAGNOSIS — J309 Allergic rhinitis, unspecified: Secondary | ICD-10-CM

## 2024-08-27 DIAGNOSIS — J42 Unspecified chronic bronchitis: Secondary | ICD-10-CM

## 2024-08-27 DIAGNOSIS — R053 Chronic cough: Secondary | ICD-10-CM

## 2024-08-27 DIAGNOSIS — R0982 Postnasal drip: Secondary | ICD-10-CM

## 2024-08-27 DIAGNOSIS — K219 Gastro-esophageal reflux disease without esophagitis: Secondary | ICD-10-CM

## 2024-08-27 DIAGNOSIS — J4530 Mild persistent asthma, uncomplicated: Secondary | ICD-10-CM

## 2024-08-27 DIAGNOSIS — E785 Hyperlipidemia, unspecified: Secondary | ICD-10-CM

## 2024-08-27 DIAGNOSIS — I10 Essential (primary) hypertension: Secondary | ICD-10-CM

## 2024-08-27 DIAGNOSIS — N1831 Chronic kidney disease, stage 3a: Secondary | ICD-10-CM

## 2024-08-27 NOTE — Progress Notes (Signed)
 [place] [facility] OFFICE VISIT    Patient Name: Leslie Ray, Leslie Ray  Date of Visit:  08/27/2024  Date of Birth: August 13, 1935  AGE: 89 y.o.  Medical Record #: [id number]  Requesting Physician: Tawni DELENA First, DO       HISTORY OF PRESENT ILLNESS     Leslie Ray is a 89 y.o.  female  has a past medical history of allergies, anemia, arthritis, asthma, GERD, gout, hyperglycemia, HLD, HTN, murmur and vertigo seen today for follow up.     Initial Consult on 11/06/2018     The patient was recently admitted to [facility] on December 4th 2019 for chest pain and shortness of breath. Chest x-ray report revealed mild hyperinflation and increased interstitial markings but no consolidation or pleural effusions. Note, I cannot review these images directly. She was treated for a presumed COPD exacerbation given wheezing and with steroids and antibiotics. She was discharged on azithromycin , prednisone , and Singulair . She has improved steadily with this and has essentially returned to her baseline as of today's visit.     At baseline, she does report some dyspnea with exertion, though this is generally mild. She does not exercise per se, but does not have limitation from dyspnea. She has no prior history of asthma or COPD, but does report occasional wheezing over the years. Since starting Singulair  December, this has helped this. She has had 2 prior pneumonias in the past. She has a history of secondhand smoke, but has never been a smoker. She does have a history of allergies and chronic postnasal drainage. It is not clear which seasons are particularly worse for her, but her allergies are generally mild as well.     Interval History     09/21/2023  She reports she has had good and bad days since her last visit 6 months ago but overall is doing well.  She is living at a new residence with no pets and is not wheezing as a result.  Today she states she coughs some, that its not bothersome or productive of any  increased volume of sputum.  She states that once and a while she will have some increased purulent sputum that responds well to short bursts of steroids prescribed by her PCP.  She takes Symbicort  160, 2 puffs twice a day. She uses nasal sprays sometimes, not often.     She endorses she is pleased with her asthma control and is able to manage symptoms with her current regime.     She has some LE edema on her left leg, otherwise denies any signs of acute symptoms of respiratory infection or process.     03/21/2024  She has been experiencing a cough aggravated by her allergies. Her cough started worsening over the past few weeks and it tends to be intermittent during the day. She denies any significant post nasal drop. She complains of occasional wheezing when she does not use her inhalers. When she is wheezing, she tends to cough. She has not required any steroids or antibiotics since her last visit. She has not had any recent ER or urgent care visits for her asthma.    08/27/2024  At her last visit, she was started on Dymista  (azelastine -fluticasone ), which has led to some improvement in her allergy and cough symptoms. However, she has been inconsistent and notices a return of symptoms when doses are missed. She denies abnormal mucus production, noting that secretions remain clear to white with the same volume and thickness.  She experiences mild shortness of breath with ambulation and when climbing stairs, occasionally accompanied by wheezing. She continues to take Symbicort  once daily and has been attempting to increase use to twice daily; her asthma remains stable. She will take Symbicort  BID when she experiences wheezing. She has not required antibiotics, oral steroids, emergency room, or urgent care visits for asthma or other respiratory issues.  She continues to take Singulair  and Claritin daily. There have been no other medical complications since her last visit.      PHYSICAL EXAM:  Vitals:    08/27/24  1359   BP: 122/70   Pulse: 73   Resp: 14   Temp: 97.8 F (36.6 C)   SpO2: 97%          Physical Exam  Cardiovascular:      Rate and Rhythm: Normal rate and regular rhythm.      Pulses: Normal pulses.      Heart sounds: Normal heart sounds. No murmur heard.  Pulmonary:      Effort: Pulmonary effort is normal.      Breath sounds: Normal breath sounds.   Musculoskeletal:      Right lower leg: No edema.      Left lower leg: No edema.       Current Outpatient Medications   Medication Sig Dispense Refill    acetaminophen (TYLENOL) 160 MG chewable tablet Chew 1 tablet (160 mg) by mouth every 6 (six) hours as needed for Pain      albuterol  sulfate HFA (PROVENTIL ) 108 (90 Base) MCG/ACT inhaler Inhale 2 puffs into the lungs every 6 (six) hours as needed for Wheezing 8.5 g 3    alendronate  (FOSAMAX ) 70 MG tablet Take 1 tab by mouth every 7 days with a full glass of water on an empty stomach. Do not take anything else by mouth or lie down for 30 mins. 12 tablet 3    allopurinol  (ZYLOPRIM ) 100 MG tablet TAKE 1 TABLET BY MOUTH DAILY 100 tablet 3    amLODIPine  (NORVASC ) 10 MG tablet Take 1 tablet (10 mg) by mouth once daily 90 tablet 3    atorvastatin  (LIPITOR) 40 MG tablet Take 1 tablet (40 mg) by mouth once daily 90 tablet 3    Azelastine -Fluticasone  137-50 MCG/ACT Suspension 1 spray by Nasal route 2 (two) times daily 23 g 5    budesonide -formoterol  (SYMBICORT ) 160-4.5 MCG/ACT inhaler Inhale 2 puffs into the lungs 2 (two) times daily 3 each 3    calcium -vitamin D  (OSCAL-500 + D) 500-200 MG-UNIT per tablet Take 1 tablet by mouth 2 (two) times daily 600mg  (1500mg ) - 200U tab      clobetasol (TEMOVATE) 0.05 % cream       estradiol (ESTRACE) 0.1 MG/GM vaginal cream       hydrocortisone  (ANUSOL -HC) 2.5 % rectal cream Place rectally 3 (three) times daily 30 g 0    hydrocortisone  (ANUSOL -HC) 25 MG suppository Place 1 suppository (25 mg) rectally 2 (two) times daily 30 suppository 0    lidocaine  (LIDODERM ) 5 % Place 1 patch onto the  skin in the morning. Remove & Discard patch within 12 hours or as directed by MD. 10 patch 0    Loratadine (CLARITIN PO) Take 5 mg by mouth once daily      losartan  (COZAAR ) 100 MG tablet Take 1 tablet (100 mg) by mouth once daily 90 tablet 3    meclizine (ANTIVERT) 25 MG tablet Take 1 tablet (25 mg) by mouth every 8 (eight) hours  as needed      metoprolol  succinate (TOPROL -XL) 100 MG 24 hr tablet Take 1 tablet (100 mg) by mouth once daily 90 tablet 3    montelukast  (SINGULAIR ) 10 MG tablet TAKE 1 TABLET BY MOUTH EVERY EVENING 90 tablet 0    Omega-3 Fatty Acids (OMEGA-3 FISH OIL) 500 MG Cap Take 2 capsules (1,000 mg) by mouth 2 (two) times daily      omeprazole (PriLOSEC) 40 MG capsule TAKE 1 CAPSULE BY MOUTH DAILY BEFORE A MEAL 90 capsule 3    polyethylene glycol (MIRALAX ) 17 GM/SCOOP powder Take 17 g by mouth daily 225 g 0    Synthroid  75 MCG tablet TAKE 1 TABLET BY MOUTH DAILY 100 tablet 2    vitamins/minerals Tab Take 1 tablet by mouth once daily       No current facility-administered medications for this visit.        Spiro/NIOX/6MWT/PFT Data:  Spirometry on 08/27/2024: FVC 1.93 (91%), FEV1 1.36 (85%), FEV1/FVC 70%-- Normal spirometry    Spirometry on 03/21/2024: FVC: 1.82 (86%), FEV1: 1.34 (83%), FEV1/FVC: 74% - Normal spirometry    Spirometry on 09/21/2023: FVC: 1.77 (82%), FEV1: 1.31 (81%), FEV1/FVC: 74% - Normal    Spirometry on 08/24/2021: FVC: 1.93 (92%), FEV1: 1.45 (91%), FEV1/FVC: 75%--Normal     Spirometry on 06/04/2021: FVC 1.98 (94%), FEV1 1.55 (97%), FEV1/FVC 79%-- Normal     Spirometry on 04/23/2021: FEV1 = 1.45 (91%), FVC = 1.84 (88%), FEV1/FVC 78% - normal     Laboratory and Chest Images:  Lab Results   Component Value Date    WBC 10.77 (H) 05/19/2022    HGB 11.8 05/19/2022    HCT 35.0 05/19/2022    PLT 358 (H) 05/19/2022    ABSEOSAUTOMA 0.72 (H) 05/19/2022      Lab Results   Component Value Date    GLU 93 08/01/2024    ALKPHOS 66 08/01/2024    AST 23 08/01/2024    ALT 21 08/01/2024    CREAT 1.0  08/01/2024    CO2 25 08/01/2024     In-Clinic CXR on 03/18/2023: personally reviewed and demonstrated, no acute cardiopulmonary process     CXR on 03/22/2019, personally reviewed and demonstrated, Mild cardiomegaly. Emphysematous changes. Left basilar atelectasis or scarring.         ASSESSMENT DETAILS:  Albany Winslow  is a 89 y.o.  female seen today for the following:    Mild persistent asthma   Stable overall - clearly benefits from Symbicort  once daily. Increases to BID when wheezing  No recent asthma exacerbations    Chronic allergic rhinitis  On Dymista , Singulair , and Claritin daily with stable allergy symptoms. Although admits to inconsistent Dymista  use. No abnormal mucus production    Gastroesophageal reflux disease    Stage 3a chronic kidney disease    Essential hypertension    Hyperlipidemia      PLAN DETAILS:  Continue Dymista  NS 2 sprays per nostril BID.  Continue Symbicort  160 mcg 2 puffs BID--Pt is aware to gargle with water, spit, then brush teeth after each use to prevent thrush  Patient is okay to continue once a day and increase to twice daily as needed   Continue ProAir  Digihaler 2 puffs every 4-6 hours prn for shortness of breath, chest tightness, or wheezing   Continue Singulair  10 mg QHS   Continue Claritin QD    Return in about 6 months (around 02/24/2025). Reviewed warning signs for worsening condition and indications and instructions for scheduling  a sooner, urgent follow-up appointment.      Moderate complexity medical decision making.         I personally scribed for Oneil JINNY Lang, MD on 08/27/2024. The documentation recorded by the scribe, Braden Diamond, accurately reflects the service I personally performed and the decisions made by me, Oneil JINNY Lang, MD.    SIGNED:    Oneil JINNY Lang, MD  [facility] Pulmon[facility]

## 2024-08-29 LAB — SPIROMETRY
-: NORMAL
FEF 25-75% (Pre-Bronch) %Pred: 64 %
FEF 25-75% (Pre-Bronch) Actual: 0.83 L/s
FEF 25-75% (Pre-Bronch) Pred: 1.29 L/s
FEF Max (Pre-Bronch) %Pred: 99 %
FEF Max (Pre-Bronch) Actual: 4.25 L/s
FEF Max (Pre-Bronch) Pred: 4.27 L/s
FEF2575 (Lower Limit of Normal): 0.5 L/s
FEF2575 (Standard Deviation): 0.61 L/s
FEFMax (Lower Limit of Normal): 2.41 L/s
FEFMax (Standard Deviation): 1.13 L/s
FEV1 (Lower Limit of Normal): 1.09 L
FEV1 (Pre-Bronch) %Pred: 85 %
FEV1 (Pre-Bronch) Actual: 1.36 L
FEV1 (Pre-Bronch) Pred: 1.6 L
FEV1 (Standard Deviation): 0.3 L
FEV1/FVC (Pre-Bronch) %Pred: 91 %
FEV1/FVC (Pre-Bronch) Actual: 70 %
FEV1/FVC (Pre-Bronch) Pred: 77 %
FVC (Lower Limit of Normal): 1.44 L
FVC (Pre-Bronch) %Pred: 91 %
FVC (Pre-Bronch) Actual: 1.93 L
FVC (Pre-Bronch) Pred: 2.12 L
FVC (Standard Deviation): 0.42 L
PEF (Pre-Actual): 255.2 L/min

## 2024-09-01 ENCOUNTER — Encounter (INDEPENDENT_AMBULATORY_CARE_PROVIDER_SITE_OTHER): Payer: Self-pay

## 2024-10-01 ENCOUNTER — Encounter (INDEPENDENT_AMBULATORY_CARE_PROVIDER_SITE_OTHER): Payer: Self-pay

## 2024-10-20 ENCOUNTER — Other Ambulatory Visit (INDEPENDENT_AMBULATORY_CARE_PROVIDER_SITE_OTHER): Payer: Self-pay | Admitting: Internal Medicine

## 2024-10-20 DIAGNOSIS — J4530 Mild persistent asthma, uncomplicated: Secondary | ICD-10-CM

## 2024-11-01 ENCOUNTER — Encounter (INDEPENDENT_AMBULATORY_CARE_PROVIDER_SITE_OTHER): Payer: Self-pay

## 2024-11-06 ENCOUNTER — Ambulatory Visit (INDEPENDENT_AMBULATORY_CARE_PROVIDER_SITE_OTHER): Admitting: Family

## 2024-11-06 ENCOUNTER — Encounter (INDEPENDENT_AMBULATORY_CARE_PROVIDER_SITE_OTHER): Payer: Self-pay | Admitting: Family

## 2024-11-06 VITALS — BP 144/70 | HR 78 | Temp 97.0°F | Resp 17 | Ht 61.0 in | Wt 134.4 lb

## 2024-11-06 DIAGNOSIS — L309 Dermatitis, unspecified: Secondary | ICD-10-CM

## 2024-11-06 MED ORDER — CLOTRIMAZOLE-BETAMETHASONE 1-0.05 % EX CREA: TOPICAL_CREAM | Freq: Two times a day (BID) | CUTANEOUS | 0 refills | Status: AC

## 2024-11-06 MED ORDER — BACITRACIN 500 UNIT/GM EX OINT: TOPICAL_OINTMENT | Freq: Two times a day (BID) | CUTANEOUS | 0 refills | Status: AC

## 2024-11-06 NOTE — Progress Notes (Signed)
 [facility]  OFFICE NOTE         Subjective   Historian: Patient      Chief Complaint   Patient presents with    Mass     Spot on her left leg onset 7-14 days         History of Present Illness  Nejla Omie Ferger is an 89 year old female who presents with skin irritation and itching.    She noticed a small itchy area on the skin a few days ago and scratched it. She has applied Neosporin to the area and cortisone cream around it for the past 2 to 3 days without improvement, and the area now appears inflamed and continues to itch. She has had similar skin irritation in the past and used the same treatments. She denies other symptoms.    History:  Medications and Allergies reviewed.   Pertinent Past Medical, Surgical, Family and Social History were reviewed.          Objective     Vitals:    11/06/24 1522   BP: 144/70   BP Site: Right arm   Patient Position: Sitting   Cuff Size: Medium   Pulse: 78   Resp: 17   Temp: 97 F (36.1 C)   TempSrc: Tympanic   SpO2: 97%   Weight: 61 kg (134 lb 6.4 oz)   Height: 1.549 m (5' 1)      Body mass index is 25.39 kg/m.          Physical Exam  Skin:     Comments: Left lower leg erythematous and indurated area noted      Physical Exam  SKIN: Skin is warmer to touch and tender.  Urgent Care Course   There were no labs reviewed with this patient during the visit.    There were no x-rays reviewed with this patient during the visit.      Procedures   Procedures     Assessment / Plan         Assessment & Plan  Inflamed dermatitis  Lesion likely irritated by scratching and Neosporin, with signs of infection or inflammation. Previous treatments ineffective.  - Apply topical antibiotic to affected area.  - Apply topical steroid cream to affected area.       There are no diagnoses linked to this encounter.     The indications for early follow-up with PCP and return to UC were discussed. Patient/family received education on the working diagnosis, diagnostic uncertainties,  and proposed treatment plan. Indications for emergency evaluation in the ED were reviewed. Written and verbal discharge instructions were provided and discussed and all questions from the patient/family were addressed, with no apparent barriers.      Verbal consent obtained to record this visit.

## 2024-12-06 ENCOUNTER — Ambulatory Visit (INDEPENDENT_AMBULATORY_CARE_PROVIDER_SITE_OTHER): Admitting: Family Medicine

## 2025-01-30 ENCOUNTER — Ambulatory Visit (INDEPENDENT_AMBULATORY_CARE_PROVIDER_SITE_OTHER): Admitting: Family Medicine

## 2025-03-20 ENCOUNTER — Ambulatory Visit (INDEPENDENT_AMBULATORY_CARE_PROVIDER_SITE_OTHER): Admitting: Internal Medicine
